# Patient Record
Sex: Male | Born: 1970
Health system: Southern US, Community
[De-identification: ages and names within clinical notes are randomized; demographics above are authoritative.]

## PROBLEM LIST (undated history)

## (undated) DIAGNOSIS — G8929 Other chronic pain: Secondary | ICD-10-CM

## (undated) DIAGNOSIS — L718 Other rosacea: Secondary | ICD-10-CM

## (undated) DIAGNOSIS — M549 Dorsalgia, unspecified: Secondary | ICD-10-CM

## (undated) DIAGNOSIS — K759 Inflammatory liver disease, unspecified: Secondary | ICD-10-CM

## (undated) DIAGNOSIS — K219 Gastro-esophageal reflux disease without esophagitis: Secondary | ICD-10-CM

## (undated) DIAGNOSIS — I10 Essential (primary) hypertension: Secondary | ICD-10-CM

## (undated) DIAGNOSIS — G4700 Insomnia, unspecified: Secondary | ICD-10-CM

## (undated) HISTORY — DX: Dorsalgia, unspecified: M54.9

## (undated) HISTORY — DX: Insomnia, unspecified: G47.00

## (undated) HISTORY — DX: Gastro-esophageal reflux disease without esophagitis: K21.9

## (undated) HISTORY — PX: LASIK: SHX215

## (undated) HISTORY — DX: Morbid (severe) obesity due to excess calories: E66.01

## (undated) HISTORY — DX: Other rosacea: L71.8

## (undated) HISTORY — DX: Essential (primary) hypertension: I10

## (undated) HISTORY — PX: KNEE SURGERY: SHX244

## (undated) HISTORY — DX: Other chronic pain: G89.29

---

## 2005-08-23 ENCOUNTER — Encounter: Admission: RE | Admit: 2005-08-23 | Discharge: 2005-09-24 | Payer: Self-pay | Admitting: Family Medicine

## 2006-06-20 ENCOUNTER — Ambulatory Visit: Payer: Self-pay | Admitting: Family Medicine

## 2006-06-29 ENCOUNTER — Ambulatory Visit: Payer: Self-pay | Admitting: Family Medicine

## 2006-07-10 ENCOUNTER — Ambulatory Visit: Payer: Self-pay | Admitting: Family Medicine

## 2006-07-10 LAB — CONVERTED CEMR LAB: Glucose, Bld: 86 mg/dL (ref 70–99)

## 2006-09-07 ENCOUNTER — Ambulatory Visit: Payer: Self-pay | Admitting: Family Medicine

## 2006-09-20 ENCOUNTER — Ambulatory Visit: Payer: Self-pay | Admitting: Family Medicine

## 2006-10-19 ENCOUNTER — Ambulatory Visit: Payer: Self-pay | Admitting: Family Medicine

## 2006-10-30 ENCOUNTER — Ambulatory Visit: Payer: Self-pay | Admitting: Family Medicine

## 2006-10-30 LAB — CONVERTED CEMR LAB
CO2: 27 meq/L (ref 19–32)
Chloride: 106 meq/L (ref 96–112)
Creatinine, Ser: 1 mg/dL (ref 0.4–1.2)
Glucose, Bld: 109 mg/dL — ABNORMAL HIGH (ref 70–99)
Potassium: 4.1 meq/L (ref 3.5–5.1)
Sodium: 138 meq/L (ref 135–145)

## 2007-01-17 DIAGNOSIS — M549 Dorsalgia, unspecified: Secondary | ICD-10-CM | POA: Insufficient documentation

## 2007-01-17 DIAGNOSIS — I10 Essential (primary) hypertension: Secondary | ICD-10-CM

## 2007-01-17 HISTORY — DX: Dorsalgia, unspecified: M54.9

## 2007-01-17 HISTORY — DX: Essential (primary) hypertension: I10

## 2007-04-05 ENCOUNTER — Ambulatory Visit: Payer: Self-pay | Admitting: Family Medicine

## 2007-04-11 ENCOUNTER — Ambulatory Visit: Payer: Self-pay | Admitting: Family Medicine

## 2007-04-11 LAB — CONVERTED CEMR LAB
Basophils Absolute: 0 10*3/uL (ref 0.0–0.1)
Calcium: 9.5 mg/dL (ref 8.4–10.5)
Chloride: 105 meq/L (ref 96–112)
Eosinophils Absolute: 0.2 10*3/uL (ref 0.0–0.6)
Eosinophils Relative: 2.6 % (ref 0.0–5.0)
GFR calc Af Amer: 81 mL/min
GFR calc non Af Amer: 67 mL/min
Glucose, Bld: 83 mg/dL (ref 70–99)
Lymphocytes Relative: 23.1 % (ref 12.0–46.0)
MCHC: 34.9 g/dL (ref 30.0–36.0)
MCV: 95.3 fL (ref 78.0–100.0)
Monocytes Relative: 8 % (ref 3.0–11.0)
Neutro Abs: 4.6 10*3/uL (ref 1.4–7.7)
Platelets: 283 10*3/uL (ref 150–400)
RBC: 4.45 M/uL (ref 3.87–5.11)
Vitamin B-12: 532 pg/mL (ref 211–911)

## 2007-04-12 ENCOUNTER — Telehealth (INDEPENDENT_AMBULATORY_CARE_PROVIDER_SITE_OTHER): Payer: Self-pay | Admitting: *Deleted

## 2007-05-31 ENCOUNTER — Ambulatory Visit: Payer: Self-pay | Admitting: Family Medicine

## 2007-06-03 ENCOUNTER — Ambulatory Visit: Payer: Self-pay | Admitting: Family Medicine

## 2007-06-04 ENCOUNTER — Telehealth (INDEPENDENT_AMBULATORY_CARE_PROVIDER_SITE_OTHER): Payer: Self-pay | Admitting: *Deleted

## 2007-06-25 ENCOUNTER — Encounter (INDEPENDENT_AMBULATORY_CARE_PROVIDER_SITE_OTHER): Payer: Self-pay | Admitting: Family Medicine

## 2007-08-20 ENCOUNTER — Ambulatory Visit: Payer: Self-pay | Admitting: Family Medicine

## 2007-08-20 LAB — CONVERTED CEMR LAB
Calcium: 9.4 mg/dL (ref 8.4–10.5)
Chloride: 107 meq/L (ref 96–112)
Cholesterol: 148 mg/dL (ref 0–200)
Creatinine, Ser: 1 mg/dL (ref 0.4–1.5)
GFR calc non Af Amer: 89 mL/min
Glucose, Bld: 88 mg/dL (ref 70–99)
LDL Cholesterol: 98 mg/dL (ref 0–99)
TSH: 1.28 microintl units/mL (ref 0.35–5.50)
VLDL: 12 mg/dL (ref 0–40)

## 2007-08-21 ENCOUNTER — Encounter (INDEPENDENT_AMBULATORY_CARE_PROVIDER_SITE_OTHER): Payer: Self-pay | Admitting: *Deleted

## 2007-09-27 ENCOUNTER — Telehealth (INDEPENDENT_AMBULATORY_CARE_PROVIDER_SITE_OTHER): Payer: Self-pay | Admitting: *Deleted

## 2007-10-02 ENCOUNTER — Telehealth (INDEPENDENT_AMBULATORY_CARE_PROVIDER_SITE_OTHER): Payer: Self-pay | Admitting: *Deleted

## 2007-11-28 ENCOUNTER — Ambulatory Visit: Payer: Self-pay | Admitting: Family Medicine

## 2007-12-03 ENCOUNTER — Encounter (INDEPENDENT_AMBULATORY_CARE_PROVIDER_SITE_OTHER): Payer: Self-pay | Admitting: *Deleted

## 2007-12-03 LAB — CONVERTED CEMR LAB
Calcium: 9.6 mg/dL (ref 8.4–10.5)
Chloride: 102 meq/L (ref 96–112)
Creatinine, Ser: 1 mg/dL (ref 0.4–1.5)
GFR calc non Af Amer: 89 mL/min

## 2007-12-24 ENCOUNTER — Ambulatory Visit: Payer: Self-pay | Admitting: Family Medicine

## 2007-12-24 ENCOUNTER — Telehealth (INDEPENDENT_AMBULATORY_CARE_PROVIDER_SITE_OTHER): Payer: Self-pay | Admitting: *Deleted

## 2007-12-24 LAB — CONVERTED CEMR LAB: Rapid Strep: NEGATIVE

## 2007-12-31 ENCOUNTER — Encounter (INDEPENDENT_AMBULATORY_CARE_PROVIDER_SITE_OTHER): Payer: Self-pay | Admitting: *Deleted

## 2008-03-31 ENCOUNTER — Ambulatory Visit: Payer: Self-pay | Admitting: Internal Medicine

## 2008-03-31 DIAGNOSIS — E669 Obesity, unspecified: Secondary | ICD-10-CM

## 2008-03-31 DIAGNOSIS — K644 Residual hemorrhoidal skin tags: Secondary | ICD-10-CM

## 2008-03-31 HISTORY — DX: Residual hemorrhoidal skin tags: K64.4

## 2008-03-31 HISTORY — DX: Obesity, unspecified: E66.9

## 2008-08-03 ENCOUNTER — Ambulatory Visit: Payer: Self-pay | Admitting: Internal Medicine

## 2008-08-05 ENCOUNTER — Encounter (INDEPENDENT_AMBULATORY_CARE_PROVIDER_SITE_OTHER): Payer: Self-pay | Admitting: *Deleted

## 2008-08-05 LAB — CONVERTED CEMR LAB
ALT: 34 units/L (ref 0–53)
BUN: 13 mg/dL (ref 6–23)
Basophils Absolute: 0 10*3/uL (ref 0.0–0.1)
Eosinophils Absolute: 0.2 10*3/uL (ref 0.0–0.7)
GFR calc Af Amer: 121 mL/min
GFR calc non Af Amer: 100 mL/min
HCT: 42.6 % (ref 39.0–52.0)
HDL: 46.2 mg/dL (ref >39.0)
MCHC: 33.9 g/dL (ref 30.0–36.0)
MCV: 96.5 fL (ref 78.0–100.0)
Monocytes Absolute: 0.4 10*3/uL (ref 0.1–1.0)
Platelets: 262 10*3/uL (ref 150–400)
Potassium: 4.6 meq/L (ref 3.5–5.1)
RDW: 11.9 % (ref 11.5–14.6)
Sodium: 141 meq/L (ref 135–145)
TSH: 1.49 microintl units/mL (ref 0.35–5.50)
Triglycerides: 60 mg/dL (ref 0–149)

## 2008-09-01 ENCOUNTER — Ambulatory Visit: Payer: Self-pay | Admitting: Family Medicine

## 2008-09-01 ENCOUNTER — Telehealth (INDEPENDENT_AMBULATORY_CARE_PROVIDER_SITE_OTHER): Payer: Self-pay | Admitting: *Deleted

## 2008-09-01 DIAGNOSIS — J0110 Acute frontal sinusitis, unspecified: Secondary | ICD-10-CM | POA: Insufficient documentation

## 2008-09-01 HISTORY — DX: Acute frontal sinusitis, unspecified: J01.10

## 2008-09-04 ENCOUNTER — Telehealth (INDEPENDENT_AMBULATORY_CARE_PROVIDER_SITE_OTHER): Payer: Self-pay | Admitting: *Deleted

## 2008-09-07 ENCOUNTER — Encounter: Payer: Self-pay | Admitting: Internal Medicine

## 2008-10-06 ENCOUNTER — Ambulatory Visit: Payer: Self-pay | Admitting: Family Medicine

## 2008-10-20 ENCOUNTER — Ambulatory Visit: Payer: Self-pay | Admitting: Family Medicine

## 2008-11-03 ENCOUNTER — Ambulatory Visit: Payer: Self-pay | Admitting: Family Medicine

## 2008-12-01 ENCOUNTER — Ambulatory Visit: Payer: Self-pay | Admitting: Family Medicine

## 2008-12-28 ENCOUNTER — Ambulatory Visit: Payer: Self-pay | Admitting: Family Medicine

## 2008-12-28 DIAGNOSIS — M7660 Achilles tendinitis, unspecified leg: Secondary | ICD-10-CM

## 2008-12-28 HISTORY — DX: Achilles tendinitis, unspecified leg: M76.60

## 2009-01-05 ENCOUNTER — Ambulatory Visit: Payer: Self-pay | Admitting: Family Medicine

## 2009-01-07 LAB — CONVERTED CEMR LAB
Albumin: 4.2 g/dL (ref 3.5–5.2)
Bilirubin, Direct: 0 mg/dL (ref 0.0–0.3)
CO2: 32 meq/L (ref 19–32)
Calcium: 9.4 mg/dL (ref 8.4–10.5)
Creatinine, Ser: 0.9 mg/dL (ref 0.4–1.5)
Total Protein: 6.6 g/dL (ref 6.0–8.3)

## 2009-01-08 ENCOUNTER — Encounter (INDEPENDENT_AMBULATORY_CARE_PROVIDER_SITE_OTHER): Payer: Self-pay | Admitting: *Deleted

## 2009-01-19 ENCOUNTER — Ambulatory Visit: Payer: Self-pay | Admitting: Family Medicine

## 2009-04-06 ENCOUNTER — Ambulatory Visit: Payer: Self-pay | Admitting: Family Medicine

## 2009-04-06 DIAGNOSIS — J069 Acute upper respiratory infection, unspecified: Secondary | ICD-10-CM | POA: Insufficient documentation

## 2009-06-29 ENCOUNTER — Ambulatory Visit: Payer: Self-pay | Admitting: Family Medicine

## 2009-06-29 DIAGNOSIS — R079 Chest pain, unspecified: Secondary | ICD-10-CM

## 2009-06-29 DIAGNOSIS — K219 Gastro-esophageal reflux disease without esophagitis: Secondary | ICD-10-CM | POA: Insufficient documentation

## 2009-06-29 DIAGNOSIS — K5289 Other specified noninfective gastroenteritis and colitis: Secondary | ICD-10-CM

## 2009-06-29 HISTORY — DX: Chest pain, unspecified: R07.9

## 2009-06-29 HISTORY — DX: Gastro-esophageal reflux disease without esophagitis: K21.9

## 2009-06-29 HISTORY — DX: Other specified noninfective gastroenteritis and colitis: K52.89

## 2009-08-03 ENCOUNTER — Ambulatory Visit: Payer: Self-pay | Admitting: Family

## 2009-08-03 LAB — CONVERTED CEMR LAB
AST: 25 units/L (ref 0–37)
BUN: 11 mg/dL (ref 6–23)
Basophils Relative: 0.1 % (ref 0.0–3.0)
Bilirubin Urine: NEGATIVE
Chloride: 102 meq/L (ref 96–112)
Cholesterol: 187 mg/dL (ref 0–200)
Eosinophils Relative: 2.8 % (ref 0.0–5.0)
GFR calc non Af Amer: 99.81 mL/min (ref >60)
Glucose, Urine, Semiquant: NEGATIVE
HCT: 45 % (ref 39.0–52.0)
Hemoglobin: 15.4 g/dL (ref 13.0–17.0)
LDL Cholesterol: 106 mg/dL — ABNORMAL HIGH (ref 0–99)
Lymphs Abs: 1.4 10*3/uL (ref 0.7–4.0)
MCV: 98.9 fL (ref 78.0–100.0)
Monocytes Relative: 6.7 % (ref 3.0–12.0)
Platelets: 306 10*3/uL (ref 150.0–400.0)
Potassium: 3.9 meq/L (ref 3.5–5.1)
Protein, U semiquant: NEGATIVE
RBC: 4.55 M/uL (ref 4.22–5.81)
Sodium: 142 meq/L (ref 135–145)
Specific Gravity, Urine: <1.005
Total CHOL/HDL Ratio: 3
VLDL: 19.4 mg/dL (ref 0.0–40.0)
WBC: 7.3 10*3/uL (ref 4.5–10.5)
pH: 7.5

## 2009-08-04 ENCOUNTER — Encounter: Payer: Self-pay | Admitting: Family

## 2009-09-06 ENCOUNTER — Telehealth: Payer: Self-pay | Admitting: Family

## 2009-09-06 ENCOUNTER — Ambulatory Visit: Payer: Self-pay | Admitting: Family

## 2009-11-12 ENCOUNTER — Telehealth: Payer: Self-pay | Admitting: Family

## 2009-12-07 ENCOUNTER — Ambulatory Visit: Payer: Self-pay | Admitting: Family

## 2009-12-07 LAB — CONVERTED CEMR LAB
CO2: 30 meq/L (ref 19–32)
Calcium: 9.5 mg/dL (ref 8.4–10.5)
Creatinine, Ser: 1 mg/dL (ref 0.4–1.5)
GFR calc non Af Amer: 88.22 mL/min (ref >60)
Glucose, Bld: 84 mg/dL (ref 70–99)

## 2010-03-09 ENCOUNTER — Ambulatory Visit: Payer: Self-pay | Admitting: Internal Medicine

## 2010-09-22 ENCOUNTER — Ambulatory Visit: Payer: Self-pay | Admitting: Family Medicine

## 2010-09-22 DIAGNOSIS — R071 Chest pain on breathing: Secondary | ICD-10-CM | POA: Insufficient documentation

## 2010-09-22 HISTORY — DX: Chest pain on breathing: R07.1

## 2010-09-23 LAB — CONVERTED CEMR LAB
ALT: 48 units/L (ref 0–53)
BUN: 15 mg/dL (ref 6–23)
Bilirubin, Direct: 0.1 mg/dL (ref 0.0–0.3)
Cholesterol: 190 mg/dL (ref 0–200)
Creatinine, Ser: 0.9 mg/dL (ref 0.4–1.5)
Eosinophils Relative: 3.3 % (ref 0.0–5.0)
GFR calc non Af Amer: 99.23 mL/min (ref >60.00)
HDL: 65.8 mg/dL (ref >39.00)
Lymphocytes Relative: 24.4 % (ref 12.0–46.0)
Monocytes Relative: 8.6 % (ref 3.0–12.0)
Neutrophils Relative %: 63.2 % (ref 43.0–77.0)
Platelets: 254 10*3/uL (ref 150.0–400.0)
Potassium: 4.2 meq/L (ref 3.5–5.1)
TSH: 2.63 microintl units/mL (ref 0.35–5.50)
Total Protein: 7.5 g/dL (ref 6.0–8.3)
Triglycerides: 32 mg/dL (ref 0.0–149.0)
VLDL: 6.4 mg/dL (ref 0.0–40.0)
WBC: 7.4 10*3/uL (ref 4.5–10.5)

## 2010-10-25 NOTE — Assessment & Plan Note (Signed)
Summary: 3 month ov/nta   Vital Signs:  Patient profile:   40 year old male Weight:      300.50 pounds O2 Sat:      98 % on Room air Temp:     98.0 degrees F oral Pulse rate:   74 / minute Pulse rhythm:   regular Resp:     12 per minute BP sitting:   130 / 78  (right arm) Cuff size:   large  Vitals Entered By: Mervin Kung CMA (December 07, 2009 8:05 AM)  O2 Flow:  Room air CC: room 17  3 month follow up Comments Pt has old inury to left lower leg.  Still has intermittent swelling and pain.   Primary Care Provider:  Drue Novel  CC:  room 17  3 month follow up.  History of Present Illness: Gilbert [name] is a 40 year old male who presents today for follow up of his hypertension.  Notes that he has been taking his meds as directed.    GERD- reports symptoms are well controlled on PPI  Also notes + tenderness on left shin.  Notes that his child accidentally kicked his shin about 6 weeks ago while playing soccer.  Swelling is improved.    Obesity- is trying to start diet again, is contemplating seeing a bariatric surgeon.   Allergies: 1)  ! Penicillin G Pot in Dextrose (Penicillin G Potassium in D5w)  Physical Exam  General:  Well-developed,well-nourished,in no acute distress; alert,appropriate and cooperative throughout examination Lungs:  Normal respiratory effort, chest expands symmetrically. Lungs are clear to auscultation, no crackles or wheezes. Heart:  Normal rate and regular rhythm. S1 and S2 normal without gallop, murmur, click, rub or other extra sounds. Msk:  small area of induration on left shin, mild echymosis (supect resolving hematoma)   Impression & Recommendations:  Problem # 1:  HYPERTENSION (ICD-401.9) Assessment Unchanged BP stable, continue same, check BMET. His updated medication list for this problem includes:    Enalapril Maleate 20 Mg Tabs (Enalapril maleate) .Marland Kitchen... Take 2 tablet by mouth once a day    Hydrochlorothiazide 25 Mg Tabs (Hydrochlorothiazide)  ..... One tablet by mouth daily  Orders: Venipuncture (11914) TLB-BMP (Basic Metabolic Panel-BMET) (80048-METABOL)  BP today: 130/78 Prior BP: 124/80 (09/06/2009)  Labs Reviewed: K+: 3.9 (08/03/2009) Creat: : 0.9 (08/03/2009)   Chol: 187 (08/03/2009)   HDL: 61.90 (08/03/2009)   LDL: 106 (08/03/2009)   TG: 97.0 (08/03/2009)  Problem # 2:  GERD (ICD-530.81) Assessment: Improved Stable on PPI His updated medication list for this problem includes:    Prilosec Otc 20 Mg Tbec (Omeprazole magnesium) ..... One tablet by mouth daily.  you may increase to two tablets once daily if your symptoms are sever  Complete Medication List: 1)  Enalapril Maleate 20 Mg Tabs (Enalapril maleate) .... Take 2 tablet by mouth once a day 2)  Fluticasone Propionate 50 Mcg/act Susp (Fluticasone propionate) .... Two sprays each nostril once daily 3)  Hydrochlorothiazide 25 Mg Tabs (Hydrochlorothiazide) .... One tablet by mouth daily 4)  Prilosec Otc 20 Mg Tbec (Omeprazole magnesium) .... One tablet by mouth daily.  you may increase to two tablets once daily if your symptoms are sever  Patient Instructions: 1)  Please follow up in 3 months. 2)  Have a nice spring! Prescriptions: HYDROCHLOROTHIAZIDE 25 MG TABS (HYDROCHLOROTHIAZIDE) one tablet by mouth daily  #30 x 2   Entered and Authorized by:   Lemont Fillers FNP   Signed by:   Efraim Kaufmann  Arvil Chaco FNP on 12/07/2009   Method used:   [facility]        [facility] [phone][facility] (retail)       [address]       [place], [place]  [address]       Ph: [phone]       Fax: [phone]   RxID:   [id number] ENALAPRIL MALEATE 20 MG TABS (ENALAPRIL MALEATE) Take 2 tablet by mouth once a day  #60 Tablet x 2   Entered and Authorized by:   Lemont Fillers FNP   Signed by:   Lemont Fillers FNP on 12/07/2009   Method used:   [facility]ronically to        [facility] [phone]* (retail)       [address]       [place], [place]  [address]       Ph: [phone]       Fax: [phone]   RxID:   [id number]   Current Allergies (reviewed today): ! PENICILLIN G POT IN DEXTROSE (PENICILLIN G POTASSIUM IN D5W)

## 2010-10-25 NOTE — Assessment & Plan Note (Signed)
Summary: cough/cbs   Vital Signs:  Patient profile:   40 year old male Weight:      300.8 pounds Temp:     98.6 degrees F oral Pulse rate:   76 / minute Resp:     14 per minute BP sitting:   114 / 70  (left arm) Cuff size:   large  Vitals Entered By: Shonna Chock (March 09, 2010 3:22 PM) CC: Cough and fever at night since Monday Comments REVIEWED MED LIST, PATIENT AGREED DOSE AND INSTRUCTION CORRECT    Primary Care Provider:  Drue Novel  CC:  Cough and fever at night since Monday.  History of Present Illness: Onset 03/07/2010 as cough,arthralgias, malaise & frontal headache, progressive through day. Temp to 101 F. PNDrainage with N&V. Rx: NSAIDS, Nyquil. As of 06/14 better except persistent malaise ; recurrent temp last night with sweats.  Allergies: 1)  ! Penicillin G Pot in Dextrose (Penicillin G Potassium in D5w)  Review of Systems General:  No F,C, or sweats today. Eyes:  Denies discharge, eye pain, and red eye. ENT:  Denies nasal congestion, sinus pressure, and sore throat; Minor frontal headache w/o facial pain or purulence. Resp:  Complains of shortness of breath, sputum productive, and wheezing; Mainly NP cough ; scant dark yellow -green. No PMH of asthma; non smoker. GI:  Complains of diarrhea; Diarrhea X 1 yesterday. GU:  Denies discharge, dysuria, and hematuria. Derm:  Denies lesion(s) and rash; no known tick exposure but doing  yardwork 06/11-12.  Physical Exam  General:  in no acute distress; alert,appropriate and cooperative throughout examination Ears:  External ear exam shows no significant lesions or deformities.  Otoscopic examination reveals clear canals, tympanic membranes are intact bilaterally without bulging, retraction, inflammation or discharge. Hearing is grossly normal bilaterally. Nose:  External nasal examination shows no deformity or inflammation.R nasal mucosa  slightly erythematous without lesions or exudates. Mouth:  Oral mucosa and oropharynx  without lesions or exudates.  Teeth in good repair. Lungs:  Normal respiratory effort, chest expands symmetrically. Lungs : initial rales RLL which cleared Heart:  Normal rate and regular rhythm. S1 and S2 normal without gallop, murmur, click, rub or other extra sounds. Abdomen:  Bowel sounds positive,abdomen soft and non-tender without masses, organomegaly or hernias noted. Cervical Nodes:  No lymphadenopathy noted Axillary Nodes:  No palpable lymphadenopathy   Impression & Recommendations:  Problem # 1:  COUGH (ICD-786.2) mainly NP ; possible RAD component. R/O atypical bacterial infection   Problem # 2:  FEVER (ICD-780.60)  Complete Medication List: 1)  Enalapril Maleate 20 Mg Tabs (Enalapril maleate) .... Take 2 tablet by mouth once a day 2)  Hydrochlorothiazide 25 Mg Tabs (Hydrochlorothiazide) .... One tablet by mouth daily 3)  Prilosec Otc 20 Mg Tbec (Omeprazole magnesium) .... One tablet by mouth daily.  you may increase to two tablets once daily if your symptoms are sever 4)  Azithromycin 250 Mg Tabs (Azithromycin) .... As per pack 5)  Promethazine Vc/codeine 6.25-5-10 Mg/45ml Syrp (Phenyleph-promethazine-cod) .Marland Kitchen.. 1 tsp every 6 hrs as needed for cough  Patient Instructions: 1)  Drink as much fluid as you can tolerate for the next few days. Neti pot once daily as needed for sinus congestion. Advair 1 inhalation every 12 hrs ; gargle & spit after use.Take 650-1000mg  of Tylenol every 4-6 hours as needed for relief of pain or comfort of fever AVOID taking more than 4000mg   in a 24 hour period (can cause liver damage in higher doses) OR take  400-600mg  of Ibuprofen (Advil, Motrin) with food every 4-6 hours as needed for relief of pain or comfort of fever.Check for ticks ! Prescriptions: PROMETHAZINE VC/CODEINE 6.25-5-10 MG/5ML SYRP (PHENYLEPH-PROMETHAZINE-COD) 1 tsp every 6 hrs as needed for cough  #120cc x 0   Entered and Authorized by:   Marga Melnick MD   Signed by:   Marga Melnick  MD on 03/09/2010   Method used:   Printed then faxed to ...       [facility] [phone]* (retail)       [address]       [place], [place]  [address]       Ph: [phone]       Fax: [phone]   RxID:   [id number] AZITHROMYCIN 250 MG TABS (AZITHROMYCIN) as per pack  #1 x 0   Entered and Authorized by:   Marga Melnick MD   Signed by:   Marga Melnick MD on 03/09/2010   Method used:   Printed then faxed to ...       [facility] [phone]* (retail)       [address]       [place], [place]  [address]       Ph: [phone]       Fax: [phone]   RxID:   [id number]

## 2010-10-25 NOTE — Progress Notes (Signed)
Summary: rx denial nasacort  Phone Note From Pharmacy   [facility] [phone]* Call For: Nasacort AQ nasal spray  Reason for Call: Needs renewal Summary of Call: Pharmacy request for Nasacort AQ NS was denied. Denial faxed to [phone].

## 2010-10-27 NOTE — Assessment & Plan Note (Signed)
Summary: cpx/fast/cbs   Vital Signs:  Patient profile:   40 year old male Height:      75.25 inches Weight:      295 pounds BMI:     36.76 Pulse rate:   85 / minute BP sitting:   120 / 80  (left arm)  Vitals Entered By: Doristine Devoid CMA (September 22, 2010 8:06 AM) CC: CPX AND LABS   History of Present Illness: 40 yo man here today for CPE.    R rib pain- sxs started 1 month ago.  no known injury.  worse w/ lying on that side, occasionally painful w/ turning.  did some heavy lifting at time that sxs started.  sxs improved after a few weeks but then returned 2-3 days ago.  no pain last night.  Preventive Screening-Counseling & Management  Alcohol-Tobacco     Alcohol drinks/day: 1     Smoking Status: never     Cigars/week: 5/yr  Caffeine-Diet-Exercise     Diet Comments: working on diet     Does Patient Exercise: no      Sexual History:  currently monogamous.        Drug Use:  never.    Current Medications (verified): 1)  Enalapril Maleate 20 Mg Tabs (Enalapril Maleate) .... Take 2 Tablet By Mouth Once A Day 2)  Hydrochlorothiazide 25 Mg Tabs (Hydrochlorothiazide) .... One Tablet By Mouth Daily 3)  Prilosec Otc 20 Mg Tbec (Omeprazole Magnesium) .... One Tablet By Mouth Daily.  You May Increase To Two Tablets Once Daily If Your Symptoms Are Sever  Allergies (verified): 1)  ! Penicillin G Pot in Dextrose (Penicillin G Potassium in D5w)  Past History:  Past medical, surgical, family and social histories (including risk factors) reviewed, and no changes noted (except as noted below).  Past Medical History: Hypertension BACK PAIN, CHRONIC (mild, on-off, sxstarted after wt gain) ocular rosacea  Past Surgical History: Reviewed history from 03/31/2008 and no changes required. Knee surgery-Right lasik eye surgery  Family History: Reviewed history from 03/31/2008 and no changes required. HTN - F CAD - no Stroke - no DM - F (diet controlled) colon Ca - no prostate  Ca - no  Social History: Reviewed history from 08/03/2008 and no changes required. Occupation: Administrator, sports Married 2 children Alcohol use-yes: socially Regular exercise-no Drug Use:  never Sexual History:  currently monogamous  Review of Systems  The patient denies anorexia, fever, weight loss, weight gain, vision loss, decreased hearing, hoarseness, chest pain, syncope, dyspnea on exertion, peripheral edema, prolonged cough, headaches, abdominal pain, melena, hematochezia, severe indigestion/heartburn, hematuria, suspicious skin lesions, depression, abnormal bleeding, enlarged lymph nodes, and testicular masses.    Physical Exam  General:  in no acute distress; alert,appropriate and cooperative throughout examination Head:  Normocephalic and atraumatic without obvious abnormalities. No apparent alopecia or balding. Eyes:  No corneal or conjunctival inflammation noted. EOMI. Perrla. Funduscopic exam benign, without hemorrhages, exudates or papilledema. Vision grossly normal. Ears:  External ear exam shows no significant lesions or deformities.  Otoscopic examination reveals clear canals, tympanic membranes are intact bilaterally without bulging, retraction, inflammation or discharge. Hearing is grossly normal bilaterally. Nose:  External nasal examination shows no deformity or inflammation. Nasal mucosa are pink and moist without lesions or exudates. Mouth:  Oral mucosa and oropharynx without lesions or exudates.  Teeth in good repair. Neck:  No deformities, masses, or tenderness noted. Lungs:  Normal respiratory effort, chest expands symmetrically. Lungs are clear to auscultation, no crackles or  wheezes. Heart:  Normal rate and regular rhythm. S1 and S2 normal without gallop, murmur, click, rub or other extra sounds. Abdomen:  Bowel sounds positive,abdomen soft and non-tender without masses, organomegaly or hernias noted. Genitalia:  Testes bilaterally descended without  nodularity, tenderness or masses. No scrotal masses or lesions. No penis lesions or urethral discharge. Pulses:  +2 carotid, radial, DP/PT Extremities:  no C/C/E Neurologic:  No cranial nerve deficits noted. Station and gait are normal. Plantar reflexes are down-going bilaterally. DTRs are symmetrical throughout. Sensory, motor and coordinative functions appear intact. Skin:  Intact without suspicious lesions or rashes Cervical Nodes:  No lymphadenopathy noted Axillary Nodes:  No palpable lymphadenopathy Psych:  Cognition and judgment appear intact. Alert and cooperative with normal attention span and concentration. No apparent delusions, illusions, hallucinations   Impression & Recommendations:  Problem # 1:  PREVENTIVE HEALTH CARE (ICD-V70.0) Assessment Unchanged pt's PE WNL.  check labs.  encouraged healthy diet and regulare exercise.  Problem # 2:  CHEST WALL PAIN, ANTERIOR (WGN-562.13) Assessment: New most likely muscular given hx of heavy lifiting prior to pain and improvement w/ time.  reviewed NSAIDs, heat/ice for pain relief.  reviewed supportive care and red flags that should prompt return.  Pt expresses understanding and is in agreement w/ this plan.  Complete Medication List: 1)  Enalapril Maleate 20 Mg Tabs (Enalapril maleate) .... Take 2 tablet by mouth once a day 2)  Hydrochlorothiazide 25 Mg Tabs (Hydrochlorothiazide) .... One tablet by mouth daily 3)  Prilosec Otc 20 Mg Tbec (Omeprazole magnesium) .... One tablet by mouth daily.  you may increase to two tablets once daily if your symptoms are sever  Other Orders: Venipuncture (08657) TLB-BMP (Basic Metabolic Panel-BMET) (80048-METABOL) TLB-CBC Platelet - w/Differential (85025-CBCD) TLB-TSH (Thyroid Stimulating Hormone) (84443-TSH) TLB-Lipid Panel (80061-LIPID) TLB-Hepatic/Liver Function Pnl (80076-HEPATIC)  Patient Instructions: 1)  Follow up in 6 months to recheck your blood pressure 2)  Your exam looks good!   Keep up the good work! 3)  We'll notify you of your blood work 4)  Call and let me know your Clindamycin dose and we can refill this for you 5)  Your rib pain is most likely muscular and should improve w/ ibuprofen as needed 6)  Call with any questions or concerns 7)  Happy New Year!!   Orders Added: 1)  Venipuncture [36415] 2)  TLB-BMP (Basic Metabolic Panel-BMET) [80048-METABOL] 3)  TLB-CBC Platelet - w/Differential [85025-CBCD] 4)  TLB-TSH (Thyroid Stimulating Hormone) [84443-TSH] 5)  TLB-Lipid Panel [80061-LIPID] 6)  TLB-Hepatic/Liver Function Pnl [80076-HEPATIC] 7)  Est. Patient 40-64 years [99396] 8)  Est. Patient Level II [84696]

## 2010-12-18 ENCOUNTER — Other Ambulatory Visit: Payer: Self-pay | Admitting: Family Medicine

## 2010-12-19 NOTE — Telephone Encounter (Signed)
Pt is not due for ov yet, refill sent.

## 2011-02-08 ENCOUNTER — Encounter: Payer: Self-pay | Admitting: Family Medicine

## 2011-03-23 ENCOUNTER — Ambulatory Visit: Payer: Self-pay | Admitting: Family Medicine

## 2011-03-23 DIAGNOSIS — Z0289 Encounter for other administrative examinations: Secondary | ICD-10-CM

## 2011-04-11 ENCOUNTER — Ambulatory Visit (INDEPENDENT_AMBULATORY_CARE_PROVIDER_SITE_OTHER): Payer: BC Managed Care – PPO | Admitting: Family Medicine

## 2011-04-11 ENCOUNTER — Encounter: Payer: Self-pay | Admitting: Family Medicine

## 2011-04-11 VITALS — BP 130/84 | HR 78 | Temp 98.7°F | Wt 306.6 lb

## 2011-04-11 DIAGNOSIS — R079 Chest pain, unspecified: Secondary | ICD-10-CM

## 2011-04-11 DIAGNOSIS — W57XXXA Bitten or stung by nonvenomous insect and other nonvenomous arthropods, initial encounter: Secondary | ICD-10-CM

## 2011-04-11 DIAGNOSIS — Z8249 Family history of ischemic heart disease and other diseases of the circulatory system: Secondary | ICD-10-CM

## 2011-04-11 DIAGNOSIS — IMO0001 Reserved for inherently not codable concepts without codable children: Secondary | ICD-10-CM

## 2011-04-11 DIAGNOSIS — T148XXA Other injury of unspecified body region, initial encounter: Secondary | ICD-10-CM

## 2011-04-11 MED ORDER — DOXYCYCLINE HYCLATE 100 MG PO TABS: 100.0000 mg | ORAL_TABLET | Freq: Two times a day (BID) | ORAL | Status: DC

## 2011-04-11 NOTE — Patient Instructions (Addendum)
Deer Tick Bites Deer ticks are brown arachnids (spider family) that vary in size from as small as the head of a pin to 1/4 inch (1/2 cm) diameter. They thrive in wooded areas. Deer are the preferred host of adult deer ticks. Small rodents are the host of young ticks (nymphs). When a person walks in a field or wooded area, young and adult ticks in the surrounding grass and vegetation can attach themselves to the skin. They can suck blood for hours to days if unnoticed. Ticks are found all over the [place] Some ticks carry a specific bacteria (Borrelia burgdorferi) that causes an infection called Lyme disease. The bacteria is typically passed into a person during the blood sucking process. This happens after the tick has been attached for at least a number of hours. While ticks can be found all over the [place] those carrying the bacteria that causes Lyme disease are most common in [place] and the [place]. Only a small proportion of ticks in these areas carry the Lyme disease bacteria and cause human infections. Ticks usually attach to warm spots on the body, such as the:  Head.   Back.   Neck.   Armpits.   Groin.  SYMPTOMS Most of the time, a deer tick bite will not be felt. You may or may not see the attached tick. You may notice mild irritation or redness around the bite site. If the deer tick passes the Lyme disease bacteria to a person, a round, red rash may be noticed 2 to 3 days after the bite. The rash may be clear in the middle, like a bull's-eye or target. If not treated, other symptoms may develop several days to weeks after the onset of the rash. These symptoms may include:  New rash lesions.   Fatigue and weakness.   General ill feeling and achiness.   Chills.   Headache and neck pain.   Swollen lymph glands.   Sore muscles and joints.  5 to 15% of untreated people with Lyme disease may develop more severe illnesses after several weeks to months. This may include inflammation  of the brain lining (meningitis), nerve palsies, an abnormal heartbeat, or severe muscle and joint pain and inflammation (myositis or arthritis). DIAGNOSIS  Physical exam and medical history.   Viewing the tick if it was saved for confirmation.   Blood tests (to check or confirm the presence of Lyme disease).  TREATMENT Most ticks do not carry disease. If found, an attached tick should be removed using tweezers. Tweezers should be placed under the body of the tick so it is removed by its attachment parts (pincers). If there are signs or symptoms of being sick, or Lyme disease is confirmed, medicines (antibiotics) that kill germs are usually prescribed. In more severe cases, antibiotics may be given through an intravenous (IV) access. HOME CARE INSTRUCTIONS  Always remove ticks with tweezers. Do not use petroleum jelly or other methods to kill or remove the tick. Slide the tweezers under the body and pull out as much as you can. If you are not sure what it is, save it in a jar and show your caregiver.   Once you remove the tick, the skin will heal on its own. Wash your hands and the affected area with water and soap. You may place a bandage on the affected area.   Take medicine as directed. You may be advised to take a full course of antibiotics.   Follow up with your caregiver as  recommended.  FINDING OUT THE RESULTS OF YOUR TEST Not all test results are available during your visit. If your test results are not back during the visit, make an appointment with your caregiver to find out the results. Do not assume everything is normal if you have not heard from your caregiver or the medical facility. It is important for you to follow up on all of your test results. PROGNOSIS If Lyme disease is confirmed, early treatment with antibiotics is very effective. Following preventive guidelines is important since it is possible to get the disease more than once. PREVENTION  Wear long sleeves and long  pants in wooded or grassy areas. Tuck your pants into your socks.   Use an insect repellent while hiking.   Check yourself, your children, and your pets regularly for ticks after playing outside.   Clear piles of leaves or brush from your yard. Ticks might live there.  SEEK MEDICAL CARE IF:  You or your child has an oral temperature above 100.4.   You develop a severe headache following the bite.   You feel generally ill.   You notice a rash.   You are having trouble removing the tick.   The bite area has red skin or yellow drainage.  SEEK IMMEDIATE MEDICAL CARE IF:  Your face is weak and droopy or you have other neurological symptoms.   You have severe joint pain or weakness.  MAKE SURE YOU:  Understand these instructions.   Will watch your condition.   Will get help right away if you are not doing well or get worse.  FOR MORE INFORMATION: Centers for Disease Control and Prevention: FootballExhibition.com.br American Academy of [employer] Physicians: www.https://powers.com/ Document Released: 12/06/2009  [facility] Patient Information 2011 [place], [place].  Chest Pain (Nonspecific) It is often hard to give a specific diagnosis for the cause of chest pain. There is always a chance that your pain could be related to something serious, such as a heart attack or a blood clot in the lungs. You need to follow up with your caregiver for further evaluation. CAUSES  Heartburn.   Pneumonia or bronchitis.   Anxiety and stress.   Inflammation around your heart (pericarditis) or lung (pleuritis or pleurisy).   A blood clot in the lung.   A collapsed lung (pneumothorax). It can develop suddenly on its own (spontaneous pneumothorax) or from injury (trauma) to the chest.  The chest wall is composed of bones, muscles, and cartilage. Any of these can be the source of the pain.  The bones can be bruised by injury.   The muscles or cartilage can be strained by coughing or overwork.   The cartilage can be  affected by inflammation and become sore (costochondritis).  DIAGNOSIS Lab tests or other studies, such as X-rays, an EKG, stress testing, or cardiac imaging, may be needed to find the cause of your pain.  TREATMENT  Treatment depends on what may be causing your chest pain. Treatment may include:   Acid blockers for heartburn.  Anti-inflammatory medicine.   Pain medicine for inflammatory conditions.  Antibiotics if an infection is present.    You may be advised to change lifestyle habits. This includes stopping smoking and avoiding caffeine and chocolate.   You may be advised to keep your head raised (elevated) when sleeping. This reduces the chance of acid going backward from your stomach into your esophagus.   Most of the time, nonspecific chest pain will improve within 2 to 3 days with rest  and mild pain medicine.  HOME CARE INSTRUCTIONS  If antibiotics were prescribed, take the full amount even if you start to feel better.   For the next few days, avoid physical activities that bring on chest pain. Continue physical activities as directed.   Do not smoke cigarettes or drink alcohol until your symptoms are gone.   Only take over-the-counter or prescription medicine for pain, discomfort, or fever as directed by your caregiver.   Follow your caregiver's suggestions for further testing if your chest pain does not go away.   Keep any follow-up appointments you made. If you do not go to an appointment, you could develop lasting (chronic) problems with pain. If there is any problem keeping an appointment, you must call to reschedule.  SEEK MEDICAL CARE IF:  You think you are having problems from the medicine you are taking. Read your medicine instructions carefully.   Your chest pain does not go away, even after treatment.   You develop a rash with blisters on your chest.  SEEK IMMEDIATE MEDICAL CARE IF:  You have increased chest pain or pain that spreads to your arm, neck, jaw,  back, or belly (abdomen).   You develop shortness of breath, an increasing cough, or you are coughing up blood.   You have severe back or abdominal pain, feel sick to your stomach (nauseous) or throw up (vomit).   You develop severe weakness, fainting, or chills.   You have an oral temperature above 100.4, not controlled by medicine.  THIS IS AN EMERGENCY. Do not wait to see if the pain will go away. Get medical help at once. Call your local emergency services 911 (911 in [place] Do not drive yourself to the hospital. MAKE SURE YOU:  Understand these instructions.   Will watch your condition.   Will get help right away if you are not doing well or get worse.  Document Released: 06/21/2005 Document Re-Released: 12/06/2009 [facility] Patient Information 2011 [place], [place].

## 2011-04-11 NOTE — Progress Notes (Signed)
  Subjective:    Patient ID: Gilbert [name], male    DOB: [birth year], 40 y.o.   MRN: [id number]  HPI Pt here c/o tick bite last week and red spot now.  No fevers, body aches, rashes etc. Pt also at end of visit mentioned occassional Chest pain with exertion.  No radiation of pain and only lasts few seconds. He is concerned because his father has heart disease.  Pt exercises and has lost some weight.  He also gets a little sob.     Review of Systems as above   Objective:   Physical Exam  Constitutional: He is oriented to person, place, and time. He appears well-developed and well-nourished.  Cardiovascular: Normal rate, regular rhythm and normal heart sounds.   No murmur heard. Pulmonary/Chest: Effort normal and breath sounds normal. No respiratory distress. He has no wheezes. He has no rales. He exhibits no tenderness.  Neurological: He is alert and oriented to person, place, and time.  Skin:     Psychiatric: He has a normal mood and affect. His behavior is normal. Judgment and thought content normal.          Assessment & Plan:

## 2011-04-12 LAB — BASIC METABOLIC PANEL
Calcium: 9.1 mg/dL (ref 8.4–10.5)
GFR: 94.11 mL/min (ref >60.00)
Sodium: 141 mEq/L (ref 135–145)

## 2011-04-12 LAB — CBC WITH DIFFERENTIAL/PLATELET
Eosinophils Absolute: 0.1 10*3/uL (ref 0.0–0.7)
Eosinophils Relative: 1.8 % (ref 0.0–5.0)
HCT: 41.8 % (ref 39.0–52.0)
Lymphs Abs: 1.9 10*3/uL (ref 0.7–4.0)
MCHC: 34.6 g/dL (ref 30.0–36.0)
MCV: 97.5 fl (ref 78.0–100.0)
Monocytes Absolute: 0.5 10*3/uL (ref 0.1–1.0)
Neutrophils Relative %: 50 % (ref 43.0–77.0)
Platelets: 269 10*3/uL (ref 150.0–400.0)
WBC: 4.9 10*3/uL (ref 4.5–10.5)

## 2011-04-12 LAB — ROCKY MTN SPOTTED FVR ABS PNL(IGG+IGM): RMSF IgM: 0.06 IV

## 2011-04-12 LAB — HEPATIC FUNCTION PANEL
ALT: 34 U/L (ref 0–53)
Total Bilirubin: 0.8 mg/dL (ref 0.3–1.2)
Total Protein: 7.4 g/dL (ref 6.0–8.3)

## 2011-04-14 ENCOUNTER — Encounter: Payer: Self-pay | Admitting: Family Medicine

## 2011-04-20 ENCOUNTER — Ambulatory Visit: Payer: Self-pay | Admitting: Family Medicine

## 2011-04-20 ENCOUNTER — Ambulatory Visit (HOSPITAL_COMMUNITY): Payer: BC Managed Care – PPO | Attending: Family Medicine | Admitting: Radiology

## 2011-04-20 DIAGNOSIS — Z8249 Family history of ischemic heart disease and other diseases of the circulatory system: Secondary | ICD-10-CM

## 2011-04-20 DIAGNOSIS — R0989 Other specified symptoms and signs involving the circulatory and respiratory systems: Secondary | ICD-10-CM

## 2011-04-20 DIAGNOSIS — R0602 Shortness of breath: Secondary | ICD-10-CM

## 2011-04-20 DIAGNOSIS — R079 Chest pain, unspecified: Secondary | ICD-10-CM | POA: Insufficient documentation

## 2011-04-20 MED ORDER — TECHNETIUM TC 99M TETROFOSMIN IV KIT
11.0000 | PACK | Freq: Once | INTRAVENOUS | Status: AC | PRN
  Administered 2011-04-20: 11 via INTRAVENOUS

## 2011-04-20 MED ORDER — TECHNETIUM TC 99M TETROFOSMIN IV KIT
33.0000 | PACK | Freq: Once | INTRAVENOUS | Status: AC | PRN
  Administered 2011-04-20: 33 via INTRAVENOUS

## 2011-04-20 NOTE — Progress Notes (Signed)
[facility] SITE 3 NUCLEAR MED [address] [place] [address] [phone]  Cardiology Nuclear Med Study  Gilbert [name] is a 40 y.o. male [id number] Mar 13, 1971   Nuclear Med Background Indication for Stress Test:  Evaluation for Ischemia History:  No previous documented CAD Cardiac Risk Factors: Family History - CAD and Hypertension  Symptoms:  Chest Pain with Exertion, DOE and SOB   Nuclear Pre-Procedure Caffeine/Decaff Intake:  None NPO After: 9:00pm   Lungs:  clear IV 0.9% NS with Angio Cath:  20g  IV Site: L Antecubital  IV Started by:  Milana Na, EMT-P  Chest Size (in):  50 Cup Size: n/a  Height: 6\' 3"  (1.905 m)  Weight:  298 lb (135.172 kg)  BMI:  Body mass index is 37.25 kg/(m^2). Tech Comments:  No Rx this am. Changed to 1 day with patient's height/weight proportion.    Nuclear Med Study 1 or 2 day study: 1 day  Stress Test Type:  Stress  Reading MD: Olga Millers, MD  Order Authorizing Provider: Jeannie Fend. [place]  Resting Radionuclide: Technetium 54m Tetrofosmin  Resting Radionuclide Dose: 11.0 mCi   Stress Radionuclide:  Technetium 17m Tetrofosmin  Stress Radionuclide Dose: 33.0 mCi           Stress Protocol Rest HR: 67 Stress HR: 157  Rest BP: 119/64 Stress BP: 176/66  Exercise Time (min): 5:59 METS: 7.0   Predicted Max HR: 180 bpm % Max HR: 87.22 bpm Rate Pressure Product: 75643   Dose of Adenosine (mg):  n/a Dose of Lexiscan: n/a mg  Dose of Atropine (mg): n/a Dose of Dobutamine: n/a mcg/kg/min (at max HR)  Stress Test Technologist: Milana Na, EMT-P  Nuclear Technologist:  Domenic Polite, CNMT     Rest Procedure:  Myocardial perfusion imaging was performed at rest 45 minutes following the intravenous administration of Technetium 34m Tetrofosmin. Rest ECG: NSR  Stress Procedure:  The patient exercised for 5:59.  The patient stopped due to fatigue and denied any chest pain.  There were no significant ST-T wave changes.   Technetium 88m Tetrofosmin was injected at peak exercise and myocardial perfusion imaging was performed after a brief delay. Stress ECG: No significant ST segment change suggestive of ischemia.  QPS Raw Data Images:  Acquisition technically good; normal left ventricular size. Stress Images:  Normal homogeneous uptake in all areas of the myocardium. Rest Images:  Normal homogeneous uptake in all areas of the myocardium. Subtraction (SDS):  No evidence of ischemia. Transient Ischemic Dilatation (Normal <1.22):  0.98 Lung/Heart Ratio (Normal <0.45):  0.21  Quantitative Gated Spect Images QGS EDV:  102 ml QGS ESV:  31 ml QGS cine images:  NL LV Function; NL Wall Motion QGS EF: 70%  Impression Exercise Capacity:  Fair exercise capacity. BP Response:  Normal blood pressure response. Clinical Symptoms:  No chest pain. ECG Impression:  No significant ST segment change suggestive of ischemia. Comparison with Prior Nuclear Study: No images to compare  Overall Impression:  Normal stress nuclear study.   Olga Millers

## 2011-04-21 NOTE — Progress Notes (Signed)
Nuclear report routed to Dr. Laury Axon. Lew Prout, Farris Has

## 2011-04-26 NOTE — Progress Notes (Signed)
mssg left for a return call    KP  

## 2011-04-28 NOTE — Progress Notes (Signed)
Discuss with patient  

## 2011-04-28 NOTE — Progress Notes (Signed)
mssg left on voicemail     KP 

## 2011-05-03 ENCOUNTER — Ambulatory Visit (INDEPENDENT_AMBULATORY_CARE_PROVIDER_SITE_OTHER): Payer: BC Managed Care – PPO | Admitting: Family Medicine

## 2011-05-03 ENCOUNTER — Encounter: Payer: Self-pay | Admitting: Family Medicine

## 2011-05-03 DIAGNOSIS — I10 Essential (primary) hypertension: Secondary | ICD-10-CM

## 2011-05-03 DIAGNOSIS — R079 Chest pain, unspecified: Secondary | ICD-10-CM

## 2011-05-03 DIAGNOSIS — G4700 Insomnia, unspecified: Secondary | ICD-10-CM

## 2011-05-03 DIAGNOSIS — E669 Obesity, unspecified: Secondary | ICD-10-CM

## 2011-05-03 NOTE — Assessment & Plan Note (Signed)
BP is well controlled.  Asymptomatic.  Check labs.

## 2011-05-03 NOTE — Assessment & Plan Note (Signed)
Reviewed pt's recent stress test w/ him and explained results.  Pt now feels comfortable and understands.

## 2011-05-03 NOTE — Assessment & Plan Note (Signed)
Reviewed that this is likely situational due to work stress.  Encouraged OTC meds prn prior to starting prescription meds.  Will follow.

## 2011-05-03 NOTE — Progress Notes (Signed)
  Subjective:    Patient ID: Gilbert [name], male    DOB: [birth year], 40 y.o.   MRN: [id number]  HPI HTN- chronic problem for pt, well controlled on enalapril and HCTZ.  No SOB, current CP, HAs, visual changes, edema.  Chest pain- pt had normal nuclear study, but isn't sure what that means.  Wants to review study.  Obesity- pt has regained the weight he has lost.  Has attempted multiple different diets- paleo, adkins, weight watchers.  Not currently exercising but considering starting couch to [facility] training.  Thinking about lap band and wants to know my thoughts.  Insomnia- can fall asleep easily but has trouble staying asleep.  Has not tried anything OTC.  Admits to increased stress recently w/ looming work deadlines.  sxs have been present for 2 weeks.   Review of Systems For ROS see HPI     Objective:   Physical Exam  Vitals reviewed. Constitutional: He is oriented to person, place, and time. He appears well-developed and well-nourished. No distress.       obese  HENT:  Head: Normocephalic and atraumatic.  Eyes: Conjunctivae and EOM are normal. Pupils are equal, round, and reactive to light.  Neck: Normal range of motion. Neck supple. No thyromegaly present.  Cardiovascular: Normal rate, regular rhythm, normal heart sounds and intact distal pulses.   No murmur heard. Pulmonary/Chest: Effort normal and breath sounds normal. No respiratory distress.  Abdominal: Soft. Bowel sounds are normal. He exhibits no distension.  Musculoskeletal: He exhibits no edema.  Lymphadenopathy:    He has no cervical adenopathy.  Neurological: He is alert and oriented to person, place, and time. No cranial nerve deficit.  Skin: Skin is warm and dry.  Psychiatric: He has a normal mood and affect. His behavior is normal.          Assessment & Plan:

## 2011-05-03 NOTE — Patient Instructions (Signed)
Schedule your complete physical in 6 months- do not eat before this appt Look into lab band surgery at [email or web address] We'll notify you of your lab results Try and get regular exercise- the couch to 5k would be great! It's fine to use OTC sleep aides for short term sleep problems- if it becomes more of a regular problem we will need to address the underlying anxiety Call with any questions or concerns Hang in there!

## 2011-05-03 NOTE — Assessment & Plan Note (Signed)
Discussed his options at length.  In favor of lap band if pt is aware of possible complications, importance of strict diet, and financial requirements.  Provided pt w/ surgery info and he is to look into it and possibly attend an Chief of Staff.

## 2011-05-04 ENCOUNTER — Telehealth: Payer: Self-pay

## 2011-05-04 LAB — BASIC METABOLIC PANEL
BUN: 19 mg/dL (ref 6–23)
Calcium: 9.3 mg/dL (ref 8.4–10.5)
Chloride: 102 mEq/L (ref 96–112)
Creatinine, Ser: 1.1 mg/dL (ref 0.4–1.5)

## 2011-05-04 NOTE — Telephone Encounter (Signed)
Message copied by Beverely Low on Thu May 04, 2011  4:42 PM ------      Message from: Sheliah Hatch      Created: Thu May 04, 2011 12:52 PM       Labs look good.  No changes

## 2011-05-04 NOTE — Telephone Encounter (Signed)
Left message to call back  

## 2011-05-04 NOTE — Telephone Encounter (Signed)
Labs mailed

## 2011-05-04 NOTE — Telephone Encounter (Signed)
Message copied by Beverely Low on Thu May 04, 2011  1:12 PM ------      Message from: Gilbert [name]      Created: Thu May 04, 2011 12:52 PM       Labs look good.  No changes

## 2011-05-19 ENCOUNTER — Other Ambulatory Visit: Payer: Self-pay | Admitting: Family Medicine

## 2011-05-19 NOTE — Telephone Encounter (Signed)
Last Ov 05-03-11.Please advise

## 2011-05-19 NOTE — Telephone Encounter (Signed)
Patient needs refill for clindamycin - [facility] [address] --- he had an ov [phone] & discussed needing a refill -

## 2011-05-21 ENCOUNTER — Other Ambulatory Visit: Payer: Self-pay | Admitting: Family

## 2011-05-22 MED ORDER — CLINDAMYCIN PHOSPHATE 1 % EX LOTN: TOPICAL_LOTION | Freq: Two times a day (BID) | CUTANEOUS | Status: DC

## 2011-05-22 NOTE — Telephone Encounter (Signed)
Done

## 2011-05-22 NOTE — Telephone Encounter (Signed)
Ok to refill x 3 

## 2011-05-23 ENCOUNTER — Other Ambulatory Visit: Payer: Self-pay | Admitting: Family Medicine

## 2011-06-20 ENCOUNTER — Encounter: Payer: Self-pay | Admitting: Family Medicine

## 2011-06-20 DIAGNOSIS — Z0279 Encounter for issue of other medical certificate: Secondary | ICD-10-CM

## 2011-11-08 ENCOUNTER — Other Ambulatory Visit: Payer: Self-pay | Admitting: Family Medicine

## 2011-11-08 MED ORDER — ENALAPRIL MALEATE 20 MG PO TABS: 20.0000 mg | ORAL_TABLET | Freq: Two times a day (BID) | ORAL | Status: DC

## 2011-11-08 MED ORDER — HYDROCHLOROTHIAZIDE 25 MG PO TABS: 25.0000 mg | ORAL_TABLET | Freq: Every day | ORAL | Status: DC

## 2011-11-08 NOTE — Telephone Encounter (Signed)
Noted last OV 05-03-11, spoke with MD Beverely Low concerning pt hx, last OV notes and current medication request, was given verbal order to send pt #30 in order for pt to have enough time to schedule a CPE Letter has been mailed to pt address noted in the chart to advise they are overdue for cpe/ov/labs and the pt needs to contact office to set up appt

## 2011-11-21 ENCOUNTER — Encounter: Payer: Self-pay | Admitting: Family Medicine

## 2011-11-21 ENCOUNTER — Encounter: Payer: Self-pay | Admitting: *Deleted

## 2011-11-21 ENCOUNTER — Ambulatory Visit (INDEPENDENT_AMBULATORY_CARE_PROVIDER_SITE_OTHER): Payer: BC Managed Care – PPO | Admitting: Family Medicine

## 2011-11-21 DIAGNOSIS — E669 Obesity, unspecified: Secondary | ICD-10-CM

## 2011-11-21 DIAGNOSIS — Z0000 Encounter for general adult medical examination without abnormal findings: Secondary | ICD-10-CM

## 2011-11-21 DIAGNOSIS — I10 Essential (primary) hypertension: Secondary | ICD-10-CM

## 2011-11-21 HISTORY — DX: Encounter for general adult medical examination without abnormal findings: Z00.00

## 2011-11-21 LAB — HEPATIC FUNCTION PANEL
ALT: 38 U/L (ref 0–53)
AST: 29 U/L (ref 0–37)
Alkaline Phosphatase: 77 U/L (ref 39–117)
Bilirubin, Direct: 0 mg/dL (ref 0.0–0.3)
Total Bilirubin: 0.7 mg/dL (ref 0.3–1.2)
Total Protein: 7.6 g/dL (ref 6.0–8.3)

## 2011-11-21 LAB — LIPID PANEL
Cholesterol: 199 mg/dL (ref 0–200)
LDL Cholesterol: 114 mg/dL — ABNORMAL HIGH (ref 0–99)
Total CHOL/HDL Ratio: 3
VLDL: 17.4 mg/dL (ref 0.0–40.0)

## 2011-11-21 LAB — CBC WITH DIFFERENTIAL/PLATELET
Eosinophils Absolute: 0.4 10*3/uL (ref 0.0–0.7)
Eosinophils Relative: 4.8 % (ref 0.0–5.0)
Lymphocytes Relative: 22.3 % (ref 12.0–46.0)
MCV: 98.3 fl (ref 78.0–100.0)
Monocytes Absolute: 0.6 10*3/uL (ref 0.1–1.0)
Neutrophils Relative %: 65.2 % (ref 43.0–77.0)
Platelets: 279 10*3/uL (ref 150.0–400.0)
WBC: 8 10*3/uL (ref 4.5–10.5)

## 2011-11-21 LAB — BASIC METABOLIC PANEL
BUN: 14 mg/dL (ref 6–23)
Calcium: 9.3 mg/dL (ref 8.4–10.5)
Glucose, Bld: 80 mg/dL (ref 70–99)

## 2011-11-21 MED ORDER — HYDROCHLOROTHIAZIDE 25 MG PO TABS: 25.0000 mg | ORAL_TABLET | Freq: Every day | ORAL | Status: DC

## 2011-11-21 MED ORDER — ENALAPRIL MALEATE 20 MG PO TABS: 20.0000 mg | ORAL_TABLET | Freq: Two times a day (BID) | ORAL | Status: DC

## 2011-11-21 NOTE — Assessment & Plan Note (Signed)
Pt's PE WNL w/ exception of obesity.  Check labs.  Anticipatory guidance provided.  

## 2011-11-21 NOTE — Assessment & Plan Note (Signed)
Chronic problem.  Well controlled on current meds.  Asymptomatic.  No changes. 

## 2011-11-21 NOTE — Assessment & Plan Note (Signed)
Unchanged.  Pt has gained 5 lbs since August.  Pt interested in moving forward w/ lap band procedure.  Will assist as able.

## 2011-11-21 NOTE — Progress Notes (Signed)
  Subjective:    Patient ID: Gilbert [name], male    DOB: January 03, 1971, 41 y.o.   MRN: [id number]  HPI CPE- no concerns w/ exception of obesity.  Went to [employer] last fall.  Wants to proceed.  Has started [place] to 5K program   Review of Systems Patient reports no vision/hearing changes, anorexia, fever ,adenopathy, persistant/recurrent hoarseness, swallowing issues, chest pain, palpitations, edema, persistant/recurrent cough, hemoptysis, dyspnea (rest,exertional, paroxysmal nocturnal), gastrointestinal  bleeding (melena, rectal bleeding), abdominal pain, excessive heart burn, GU symptoms (dysuria, hematuria, voiding/incontinence issues) syncope, focal weakness, memory loss, numbness & tingling, skin/hair/nail changes, depression, anxiety, abnormal bruising/bleeding.     Objective:   Physical Exam BP 122/80  Pulse 84  Temp(Src) 98.7 F (37.1 C) (Oral)  Ht 6\' 2"  (1.88 m)  Wt 310 lb 3.2 oz (140.706 kg)  BMI 39.83 kg/m2  SpO2 96%  General Appearance:    Alert, cooperative, no distress, appears stated age, obese  Head:    Normocephalic, without obvious abnormality, atraumatic  Eyes:    PERRL, conjunctiva/corneas clear, EOM's intact, fundi    benign, both eyes       Ears:    Normal TM's and external ear canals, both ears  Nose:   Nares normal, septum midline, mucosa normal, no drainage   or sinus tenderness  Throat:   Lips, mucosa, and tongue normal; teeth and gums normal  Neck:   Supple, symmetrical, trachea midline, no adenopathy;       thyroid:  No enlargement/tenderness/nodules  Back:     Symmetric, no curvature, ROM normal, no CVA tenderness  Lungs:     Clear to auscultation bilaterally, respirations unlabored  Chest wall:    No tenderness or deformity  Heart:    Regular rate and rhythm, S1 and S2 normal, no murmur, rub   or gallop  Abdomen:     Soft, non-tender, bowel sounds active all four quadrants,    no masses, no organomegaly  Genitalia:    Normal male without lesion,  discharge or tenderness  Rectal:    Deferred due to young age  Extremities:   Extremities normal, atraumatic, no cyanosis or edema.  + vericosities bilateral LEs  Pulses:   2+ and symmetric all extremities  Skin:   Skin color, texture, turgor normal, no rashes or lesions  Lymph nodes:   Cervical, supraclavicular, and axillary nodes normal  Neurologic:   CNII-XII intact. Normal strength, sensation and reflexes      throughout          Assessment & Plan:

## 2011-11-21 NOTE — Patient Instructions (Signed)
Follow up in 6 months to recheck BP Let me know what you need for the lap band procedure Call with any questions or concerns [place]!

## 2012-01-01 ENCOUNTER — Telehealth: Payer: Self-pay | Admitting: Family Medicine

## 2012-01-01 NOTE — Telephone Encounter (Signed)
Refill- enalapril maleate 20mg  tab. Take two tablets every day. Qty 60 last fill 1.8.13

## 2012-01-01 NOTE — Telephone Encounter (Signed)
Refill- hydrochlorothiazide 25mg  tab. Take one tablet every day. Qty 30 last fill 1.8.13

## 2012-01-02 MED ORDER — HYDROCHLOROTHIAZIDE 25 MG PO TABS: 25.0000 mg | ORAL_TABLET | Freq: Every day | ORAL | Status: DC

## 2012-01-02 MED ORDER — ENALAPRIL MALEATE 20 MG PO TABS: 20.0000 mg | ORAL_TABLET | Freq: Two times a day (BID) | ORAL | Status: DC

## 2012-01-02 NOTE — Telephone Encounter (Signed)
rx sent to pharmacy by e-script  

## 2012-01-16 ENCOUNTER — Encounter (INDEPENDENT_AMBULATORY_CARE_PROVIDER_SITE_OTHER): Payer: Self-pay | Admitting: General Surgery

## 2012-01-16 ENCOUNTER — Ambulatory Visit (INDEPENDENT_AMBULATORY_CARE_PROVIDER_SITE_OTHER): Payer: BC Managed Care – PPO | Admitting: General Surgery

## 2012-01-16 ENCOUNTER — Telehealth (INDEPENDENT_AMBULATORY_CARE_PROVIDER_SITE_OTHER): Payer: Self-pay

## 2012-01-16 DIAGNOSIS — I10 Essential (primary) hypertension: Secondary | ICD-10-CM

## 2012-01-16 NOTE — Telephone Encounter (Signed)
Called patient to offer earlier appointment time, patient work's in [place] and need's to keep original time for 3:30pm.

## 2012-01-16 NOTE — Progress Notes (Signed)
Patient ID: Gilbert [name], male   DOB: [birth year], 41 y.o.   MRN: [id number]  Chief Complaint  Patient presents with  . Weight Loss Surgery    Lap Band Initial    HPI Gilbert [name] is a 41 y.o. male.  HPI This patient presents for new patient evaluation and initial weight loss surgery consultation. He has a tender information session and is most interested in the LAP-BAND. He says that he is afraid of the gastric bypass because of the surgical risks. He says that he has been athletic through high school and college but since college has steadily gained weight. He has tried several diets in the most affected eye with a low carb diet and Optifast and has been under a physician's supervised diet with Dr. Beverely Low. He does have heartburn which has come up recently and he takes Prilosec daily for treatment of this. As long as he takes Prilosec he did not have any complaints of heartburn.  Past Medical History  Diagnosis Date  . Hypertension   . Chronic back pain   . Ocular rosacea   . GERD (gastroesophageal reflux disease)   . Morbid obesity     Past Surgical History  Procedure Date  . Knee surgery     right  . Lasik     Family History  Problem Relation Age of Onset  . Hypertension Father   . Diabetes Father   . Heart disease Father     pacemaker    Social History History  Substance Use Topics  . Smoking status: Never Smoker   . Smokeless tobacco: Never Used  . Alcohol Use: No     social    Allergies  Allergen Reactions  . Penicillins     Current Outpatient Prescriptions  Medication Sig Dispense Refill  . clindamycin (CLEOCIN T) 1 % lotion Apply topically 2 (two) times daily.  60 mL  3  . enalapril (VASOTEC) 20 MG tablet Take 1 tablet (20 mg total) by mouth 2 (two) times daily.  60 tablet  6  . hydrochlorothiazide (HYDRODIURIL) 25 MG tablet Take 1 tablet (25 mg total) by mouth daily.  30 tablet  6  . hydrocortisone (ANUSOL-HC) 25 MG suppository USE 1 SUPPOSITORY EVERY  6 HOURS AS NEEDED FOR PAIN  60 suppository  0  . omeprazole (PRILOSEC) 20 MG capsule Take 20 mg by mouth daily.          Review of Systems Review of Systems All other review of systems negative or noncontributory except as stated in the HPI Blood pressure 142/88, pulse 76, temperature 97.2 F (36.2 C), resp. rate 16, height 6\' 2"  (1.88 m), weight 312 lb 12.8 oz (141.885 kg).  Physical Exam Physical Exam Physical Exam  Vitals reviewed. Constitutional: He is oriented to person, place, and time. He appears well-developed and well-nourished. No distress.  HENT:  Head: Normocephalic and atraumatic.  Mouth/Throat: No oropharyngeal exudate.  Eyes: Conjunctivae and EOM are normal. Pupils are equal, round, and reactive to light. Right eye exhibits no discharge. Left eye exhibits no discharge. No scleral icterus.  Neck: Normal range of motion. No tracheal deviation present.  Cardiovascular: Normal rate, regular rhythm and normal heart sounds.   Pulmonary/Chest: Effort normal and breath sounds normal. No stridor. No respiratory distress. He has no wheezes. He has no rales. He exhibits no tenderness.  Abdominal: Soft. Bowel sounds are normal. He exhibits no distension and no mass. There is no tenderness. There is no rebound and no  guarding.  Musculoskeletal: Normal range of motion. He exhibits no edema and no tenderness.  Neurological: He is alert and oriented to person, place, and time.  Skin: Skin is warm and dry. No rash noted. He is not diaphoretic. No erythema. No pallor.  Psychiatric: He has a normal mood and affect. His behavior is normal. Judgment and thought content normal.    Data Reviewed labs  Assessment    Morbid obesity with a BMI of 40 and hypertension We had a long discussion regarding all the surgical options for weight loss including the lap band, sleeve gastrectomy, and Roux-en-Y gastric bypass. I think he be a fine candidate for any of the procedures which he chooses. We  discussed the pros and cons and risks and benefits of each of the procedures and he is leaning towards the lap band. He was not familiar with the sleeve gastrectomy prior to this visit so he is going to do some research on this procedure as well and final decision as to what he is interested in. I did explain to him that given his reflux sleeve gastrectomy may not be the best procedure for him if this might worsen his symptoms and make the medication effective. We also discussed the possible risk of converting this to gastric bypass. We discussed the other surgical risks and particular treatment procedure including slippage of band erosion and the need for reoperation as well as anastomotic leaks in staple line leaks and vitamin deficiencies. We will start him on the workup for lap band and we will see him back after his workup is complete.    Plan    I have seen his labs and knee should be adequate for his initial workup.and  We will get an upper GI and nutrition and cytology evaluations and he will see [place] back after this to review the information and set up his surgery.       Lodema Pilot DAVID 01/16/2012, 5:12 PM

## 2012-01-22 ENCOUNTER — Ambulatory Visit: Payer: BC Managed Care – PPO | Admitting: *Deleted

## 2012-01-31 ENCOUNTER — Encounter: Payer: Self-pay | Admitting: *Deleted

## 2012-01-31 ENCOUNTER — Encounter: Payer: BC Managed Care – PPO | Attending: General Surgery | Admitting: *Deleted

## 2012-01-31 VITALS — Ht 75.0 in | Wt 313.5 lb

## 2012-01-31 DIAGNOSIS — Z01818 Encounter for other preprocedural examination: Secondary | ICD-10-CM | POA: Insufficient documentation

## 2012-01-31 DIAGNOSIS — Z713 Dietary counseling and surveillance: Secondary | ICD-10-CM | POA: Insufficient documentation

## 2012-01-31 DIAGNOSIS — E669 Obesity, unspecified: Secondary | ICD-10-CM | POA: Insufficient documentation

## 2012-01-31 NOTE — Progress Notes (Signed)
  Pre-Op Assessment Visit:  Pre-Operative LAGB Surgery  Medical Nutrition Therapy:  Appt start time: 1200 end time:  1300.  Patient was seen on 01/31/2012 for Pre-Operative LAGB Nutrition Assessment. Assessment and letter of approval faxed to [facility] Surgery Bariatric Surgery Program coordinator on 01/31/2012.  Approval letter and Candace Gallus results sent to [facility] and will be available in the chart under the media tab.  TANITA  BODY COMP RESULTS  01/31/12  %Fat 46.9%  FM (lbs) 147.0  FFM (lbs) 166.5  TBW (lbs) 122.0   Handouts given during visit include:  Pre-Op Goals   Bariatric Protein Shakes   Patient to call for Pre-Op and Post-Op Nutrition Education at the [facility] when surgery is scheduled.

## 2012-01-31 NOTE — Patient Instructions (Addendum)
   Follow Pre-Op Nutrition Goals to prepare for Lap Band Surgery.   Call the [facility] at [phone] once you have been given your surgery date to enrolled in the Pre-Op Nutrition Class. You will need to attend this nutrition class 3-4 weeks prior to your surgery.  

## 2012-02-05 ENCOUNTER — Ambulatory Visit (HOSPITAL_COMMUNITY)
Admission: RE | Admit: 2012-02-05 | Discharge: 2012-02-05 | Disposition: A | Discharge status: Home / Self Care | Payer: BC Managed Care – PPO | Source: Ambulatory Visit | Attending: General Surgery | Admitting: General Surgery

## 2012-02-05 DIAGNOSIS — L719 Rosacea, unspecified: Secondary | ICD-10-CM | POA: Insufficient documentation

## 2012-02-05 DIAGNOSIS — Z6841 Body Mass Index (BMI) 40.0 and over, adult: Secondary | ICD-10-CM | POA: Insufficient documentation

## 2012-02-05 DIAGNOSIS — K219 Gastro-esophageal reflux disease without esophagitis: Secondary | ICD-10-CM | POA: Insufficient documentation

## 2012-02-05 DIAGNOSIS — M549 Dorsalgia, unspecified: Secondary | ICD-10-CM | POA: Insufficient documentation

## 2012-02-05 DIAGNOSIS — I10 Essential (primary) hypertension: Secondary | ICD-10-CM | POA: Insufficient documentation

## 2012-02-23 ENCOUNTER — Ambulatory Visit (INDEPENDENT_AMBULATORY_CARE_PROVIDER_SITE_OTHER): Payer: BC Managed Care – PPO | Admitting: Surgery

## 2012-02-23 ENCOUNTER — Encounter (INDEPENDENT_AMBULATORY_CARE_PROVIDER_SITE_OTHER): Payer: Self-pay | Admitting: Surgery

## 2012-02-23 VITALS — BP 122/74 | HR 78 | Temp 97.0°F | Ht 75.0 in | Wt 316.8 lb

## 2012-02-23 DIAGNOSIS — E669 Obesity, unspecified: Secondary | ICD-10-CM

## 2012-02-23 NOTE — Progress Notes (Signed)
Patient ID: Gilbert [name], male   DOB: 26-Feb-1971, 41 y.o.   MRN: [id number]  Chief Complaint  Patient presents with  . Bariatric Pre-op    HPI [name] is a 41 y.o. male.  HPI This patient presents for new patient evaluation and initial weight loss surgery consultation. He has a tender information session and is most interested in the LAP-BAND. He says that he is afraid of the gastric bypass because of the surgical risks. He says that he has been athletic through high school and college but since college has steadily gained weight. He has tried several diets in the most affected eye with a low carb diet and Optifast and has been under a physician's supervised diet with Dr. Beverely Low. He does have heartburn which has come up recently and he takes Prilosec daily for treatment of this. As long as he takes Prilosec he did not have any complaints of heartburn.  Past Medical History  Diagnosis Date  . Hypertension   . Chronic back pain   . Ocular rosacea   . GERD (gastroesophageal reflux disease)   . Morbid obesity     Past Surgical History  Procedure Date  . Knee surgery     right  . Lasik     Family History  Problem Relation Age of Onset  . Hypertension Father   . Diabetes Father   . Heart disease Father     pacemaker    Social History History  Substance Use Topics  . Smoking status: Never Smoker   . Smokeless tobacco: Never Used  . Alcohol Use: No     social    Allergies  Allergen Reactions  . Penicillins     Current Outpatient Prescriptions  Medication Sig Dispense Refill  . clindamycin (CLEOCIN T) 1 % lotion Apply topically 2 (two) times daily.  60 mL  3  . cycloSPORINE (RESTASIS) 0.05 % ophthalmic emulsion Place 1 drop into both eyes 2 (two) times daily.      Marland Kitchen DOXYCYCLINE HYCLATE PO Take 50 mg by mouth 2 (two) times daily.      . enalapril (VASOTEC) 20 MG tablet Take 1 tablet (20 mg total) by mouth 2 (two) times daily.  60 tablet  6  . hydrochlorothiazide  (HYDRODIURIL) 25 MG tablet Take 1 tablet (25 mg total) by mouth daily.  30 tablet  6  . hydrocortisone (ANUSOL-HC) 25 MG suppository USE 1 SUPPOSITORY EVERY 6 HOURS AS NEEDED FOR PAIN  60 suppository  0  . omeprazole (PRILOSEC) 20 MG capsule Take 20 mg by mouth daily.          Review of Systems Review of Systems All other review of systems negative or noncontributory except as stated in the HPI Blood pressure 122/74, pulse 78, temperature 97 F (36.1 C), temperature source Temporal, height 6\' 3"  (1.905 m), weight 316 lb 12.8 oz (143.7 kg), SpO2 98.00%.  Physical Exam Physical Exam Physical Exam  Vitals reviewed. Constitutional: He is oriented to person, place, and time. He appears well-developed and well-nourished. No distress.  HENT:  Head: Normocephalic and atraumatic.  Mouth/Throat: No oropharyngeal exudate.  Eyes: Conjunctivae and EOM are normal. Pupils are equal, round, and reactive to light. Right eye exhibits no discharge. Left eye exhibits no discharge. No scleral icterus.  Neck: Normal range of motion. No tracheal deviation present.  Cardiovascular: Normal rate, regular rhythm and normal heart sounds.   Pulmonary/Chest: Effort normal and breath sounds normal. No stridor. No respiratory distress. He  has no wheezes. He has no rales. He exhibits no tenderness.  Abdominal: Soft. Bowel sounds are normal. He exhibits no distension and no mass. There is no tenderness. There is no rebound and no guarding.  Musculoskeletal: Normal range of motion. He exhibits no edema and no tenderness.  Neurological: He is alert and oriented to person, place, and time.  Skin: Skin is warm and dry. No rash noted. He is not diaphoretic. No erythema. No pallor.  Psychiatric: He has a normal mood and affect. His behavior is normal. Judgment and thought content normal.    Data Reviewed labs  Assessment    Morbid obesity with a BMI of 40 and hypertension We had a long discussion regarding all the  surgical options for weight loss including the lap band, sleeve gastrectomy, and Roux-en-Y gastric bypass. I think he be a fine candidate for any of the procedures which he chooses. We discussed the pros and cons and risks and benefits of each of the procedures and he is leaning towards the lap band. He was not familiar with the sleeve gastrectomy prior to this visit so he is going to do some research on this procedure as well and final decision as to what he is interested in. I did explain to him that given his reflux sleeve gastrectomy may not be the best procedure for him if this might worsen his symptoms and make the medication effective. We also discussed the possible risk of converting this to gastric bypass. We discussed the other surgical risks and particular treatment procedure including slippage of band erosion and the need for reoperation as well as anastomotic leaks in staple line leaks and vitamin deficiencies. We will start him on the workup for lap band and we will see him back after his workup is complete.    Plan    I have seen his labs and knee should be adequate for his initial workup.and  We will get an upper GI and nutrition and cytology evaluations and he will see [place] back after this to review the information and set up his surgery.       Sheetal Lyall B 02/23/2012, 4:26 PM   [name] Made an appointment to see me regarding his desire for a lap band. They have really done all the workup in the anyone like the band placed in mid July. He had requested that I do the surgery and hence he directed himself to me.  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 Based on its size he may need an APL band. He has some reflux issues for which he takes Prilosec but his upper GI did not show a hiatal hernia or reflux reflux.  Internist chart over the Okey Regal to get him scheduled for lapband in July

## 2012-04-04 ENCOUNTER — Encounter: Payer: BC Managed Care – PPO | Attending: General Surgery | Admitting: *Deleted

## 2012-04-04 VITALS — Ht 75.0 in | Wt 313.0 lb

## 2012-04-04 DIAGNOSIS — Z713 Dietary counseling and surveillance: Secondary | ICD-10-CM | POA: Insufficient documentation

## 2012-04-04 DIAGNOSIS — E669 Obesity, unspecified: Secondary | ICD-10-CM | POA: Insufficient documentation

## 2012-04-04 DIAGNOSIS — Z01818 Encounter for other preprocedural examination: Secondary | ICD-10-CM | POA: Insufficient documentation

## 2012-04-06 ENCOUNTER — Encounter: Payer: Self-pay | Admitting: *Deleted

## 2012-04-06 NOTE — Progress Notes (Addendum)
  Bariatric Class:  Appt start time: 1830 end time:  1930.  Pre-Operative Nutrition Class  Patient was seen on 04/04/12 for Pre-Operative [facility]ariatric Surgery [facility]   Surgery date: 04/23/12 Surgery type: LAGB  Weight today: 313.0 lbs Weight change: n/a Total weight lost: n/a BMI: 41.3 kg/m^2  Samples given per MNT protocol: Bariatric Advantage Multivitamin Lot #  [id number] Exp: 09/13  Bariatric Advantage Calcium Citrate Lot # [id number] Exp: 09/13  Celebrate Vitamins Multivitamin Lot # [id number] Exp: 11/14  Celebrate Vitamins Calcium Citrate Lot # [id number] Exp: 10/14  Unjury Protein Powder (Chicken Soup Flavor) Lot # [id number] Exp: 09/14  The following the learning objective met by the patient during this course:  Identifies Pre-Op Dietary Goals and will begin 2 weeks pre-operatively  Identifies appropriate sources of fluids and proteins   States protein recommendations and appropriate sources pre and post-operatively  Identifies Post-Operative Dietary Goals and will follow for 2 weeks post-operatively  Identifies appropriate multivitamin and calcium sources  Describes the need for physical activity post-operatively and will follow MD recommendations  States when to call healthcare provider regarding medication questions or post-operative complications  Handouts given during class include:  Pre-Op Bariatric Surgery Diet Handout  Protein Shake Handout  Post-Op Bariatric Surgery Nutrition Handout  BELT Program Information Flyer  Support Group Information Flyer  Follow-Up Plan: Patient will follow-up at [facility] 2 weeks post operatively for diet advancement per MD.

## 2012-04-06 NOTE — Patient Instructions (Addendum)
Follow:   Pre-Op Diet per MD 2 weeks prior to surgery  Phase 2- Liquids (clear/full) 2 weeks after surgery  Vitamin/Mineral/Calcium guidelines for purchasing bariatric supplements  Exercise guidelines pre and post-op per MD  Follow-up at [facility] in 2 weeks post-op for diet advancement. Contact Gurjit Loconte as needed with questions/concerns. 

## 2012-04-11 ENCOUNTER — Ambulatory Visit (INDEPENDENT_AMBULATORY_CARE_PROVIDER_SITE_OTHER): Payer: BC Managed Care – PPO | Admitting: Family Medicine

## 2012-04-11 ENCOUNTER — Encounter: Payer: Self-pay | Admitting: Family Medicine

## 2012-04-11 VITALS — BP 124/80 | HR 84 | Temp 98.6°F | Ht 74.75 in | Wt 309.6 lb

## 2012-04-11 DIAGNOSIS — Z01818 Encounter for other preprocedural examination: Secondary | ICD-10-CM

## 2012-04-11 NOTE — Progress Notes (Signed)
Subjective:    Gilbert [name] is a 41 y.o. male who presents to the office today for a preoperative consultation at the request of surgeon Dr Daphine Deutscher who plans on performing lap band on July 30. This consultation is requested for the specific conditions prompting preoperative evaluation (i.e. because of potential affect on operative risk): HTN, obesity. Planned anesthesia: general. The patient has the following known anesthesia issues: none. Patients bleeding risk: no recent abnormal bleeding, no remote history of abnormal bleeding and no use of Ca-channel blockers. Patient does not have objections to receiving blood products if needed.  The following portions of the patient's history were reviewed and updated as appropriate: allergies, current medications, past family history, past medical history, past social history, past surgical history and problem list.  Review of Systems A comprehensive review of systems was negative.    Objective:        Predictors of intubation difficulty:  Morbid obesity? yes  Anatomically abnormal facies? no  Prominent incisors? no  Receding mandible? no  Short, thick neck? yes   Neck range of motion: normal  Mallampati score: NA  Thyromental distance: NA  Dentition: No chipped, loose, or missing teeth.  Cardiographics ECG: normal sinus rhythm, no blocks or conduction defects, no ischemic changes Echocardiogram: not done  Imaging Chest x-ray: NA   Lab Review  Office Visit on 11/21/2011  Component Date Value  . WBC 11/21/2011 8.0   . RBC 11/21/2011 4.51   . Hemoglobin 11/21/2011 15.1   . HCT 11/21/2011 44.3   . MCV 11/21/2011 98.3   . MCHC 11/21/2011 34.0   . RDW 11/21/2011 12.8   . Platelets 11/21/2011 279.0   . Neutrophils Relative 11/21/2011 65.2   . Lymphocytes Relative 11/21/2011 22.3   . Monocytes Relative 11/21/2011 7.2   . Eosinophils Relative 11/21/2011 4.8   . Basophils Relative 11/21/2011 0.5   . Neutro Abs 11/21/2011 5.2   .  Lymphs Abs 11/21/2011 1.8   . Monocytes Absolute 11/21/2011 0.6   . Eosinophils Absolute 11/21/2011 0.4   . Basophils Absolute 11/21/2011 0.0   . Sodium 11/21/2011 136   . Potassium 11/21/2011 4.1   . Chloride 11/21/2011 99   . CO2 11/21/2011 29   . Glucose, Bld 11/21/2011 80   . BUN 11/21/2011 14   . Creatinine, Ser 11/21/2011 0.9   . Calcium 11/21/2011 9.3   . GFR 11/21/2011 94.99   . Total Bilirubin 11/21/2011 0.7   . Bilirubin, Direct 11/21/2011 0.0   . Alkaline Phosphatase 11/21/2011 77   . AST 11/21/2011 29   . ALT 11/21/2011 38   . Total Protein 11/21/2011 7.6   . Albumin 11/21/2011 4.6   . TSH 11/21/2011 1.64   . Cholesterol 11/21/2011 199   . Triglycerides 11/21/2011 87.0   . HDL 11/21/2011 67.20   . VLDL 11/21/2011 17.4   . LDL Cholesterol 11/21/2011 114*  . Total CHOL/HDL Ratio 11/21/2011 3       Assessment:      41 y.o. male with planned surgery as above.   Known risk factors for perioperative complications: None   Difficulty with intubation is not anticipated.  Current medications which may produce withdrawal symptoms if withheld perioperatively: none    Plan:    1. Preoperative workup as follows none. 2. Change in medication regimen before surgery: none, continue medication regimen including morning of surgery, with sip of water. 3. Prophylaxis for cardiac events with perioperative beta-blockers: not indicated. 4. Invasive hemodynamic monitoring perioperatively:  not indicated. 5. Deep vein thrombosis prophylaxis postoperatively:regimen to be chosen by surgical team. 6. Surveillance for postoperative MI with ECG immediately postoperatively and on postoperative days 1 and 2 AND troponin levels 24 hours postoperatively and on day 4 or hospital discharge (whichever comes first): at the discretion of anesthesiologist. 7. Other measures: none

## 2012-04-11 NOTE — Progress Notes (Signed)
NEED PRE OP ORDERS PLEASE-   PST APPT 04/16/12   Thanks

## 2012-04-11 NOTE — Patient Instructions (Addendum)
Everything looks great! I'll fax the note to Dr Daphine Deutscher Call with any questions or concerns GOOD LUCK!!

## 2012-04-12 ENCOUNTER — Other Ambulatory Visit (INDEPENDENT_AMBULATORY_CARE_PROVIDER_SITE_OTHER): Payer: Self-pay | Admitting: Surgery

## 2012-04-15 ENCOUNTER — Encounter (HOSPITAL_COMMUNITY): Payer: Self-pay | Admitting: Pharmacy Technician

## 2012-04-15 NOTE — Patient Instructions (Addendum)
20 Gilbert [name]  04/15/2012   Your procedure is scheduled on:  04-23-12 at 1:00 PM  Report to SHORT STAY DEPT  at 10:30 AM.  Call this number if you have problems the morning of surgery: [phone]   Remember:   Do not eat food or drink liquids AFTER MIDNIGHT  May have clear liquids UNTIL 6 HOURS BEFORE SURGERY (7:00 AM)  Clear liquids include soda, tea, black coffee, apple or grape juice, broth.  Take these medicines the morning of surgery with A SIP OF WATER: DOXYCYCLINE / PRILOSEC   Do not wear jewelry, make-up or nail polish.  Do not wear lotions, powders, or perfumes.   Do not shave legs or underarms 48 hrs. before surgery (men may shave face)  Do not bring valuables to the hospital.  Contacts, dentures or bridgework may not be worn into surgery.  Leave suitcase in the car. After surgery it may be brought to your room.  For patients admitted to the hospital, checkout time is 11:00 AM the day of discharge.   Patients discharged the day of surgery will not be allowed to drive home.    Special Instructions:   Please read over the following fact sheets that you were given: MRSA  Information               SHOWER WITH BETASEPT THE NIGHT BEFORE SURGERY AND THE MORNING OF SURGERY

## 2012-04-16 ENCOUNTER — Encounter (HOSPITAL_COMMUNITY)
Admission: RE | Admit: 2012-04-16 | Discharge: 2012-04-16 | Disposition: A | Discharge status: Home / Self Care | Payer: BC Managed Care – PPO | Source: Ambulatory Visit | Attending: Surgery | Admitting: Surgery

## 2012-04-16 ENCOUNTER — Encounter (HOSPITAL_COMMUNITY): Payer: Self-pay

## 2012-04-16 ENCOUNTER — Ambulatory Visit (HOSPITAL_COMMUNITY)
Admission: RE | Admit: 2012-04-16 | Discharge: 2012-04-16 | Disposition: A | Discharge status: Home / Self Care | Payer: BC Managed Care – PPO | Source: Ambulatory Visit | Attending: Surgery | Admitting: Surgery

## 2012-04-16 DIAGNOSIS — I10 Essential (primary) hypertension: Secondary | ICD-10-CM | POA: Insufficient documentation

## 2012-04-16 DIAGNOSIS — Z01812 Encounter for preprocedural laboratory examination: Secondary | ICD-10-CM | POA: Insufficient documentation

## 2012-04-16 HISTORY — DX: Inflammatory liver disease, unspecified: K75.9

## 2012-04-16 LAB — COMPREHENSIVE METABOLIC PANEL
AST: 37 U/L (ref 0–37)
Alkaline Phosphatase: 83 U/L (ref 39–117)
BUN: 21 mg/dL (ref 6–23)
CO2: 24 mEq/L (ref 19–32)
Chloride: 94 mEq/L — ABNORMAL LOW (ref 96–112)
Creatinine, Ser: 0.85 mg/dL (ref 0.50–1.35)
GFR calc non Af Amer: >90 mL/min (ref >90)
Potassium: 3.3 mEq/L — ABNORMAL LOW (ref 3.5–5.1)
Total Bilirubin: 0.6 mg/dL (ref 0.3–1.2)

## 2012-04-16 LAB — CBC
HCT: 41.9 % (ref 39.0–52.0)
Hemoglobin: 14.8 g/dL (ref 13.0–17.0)
MCH: 32.5 pg (ref 26.0–34.0)
MCHC: 35.3 g/dL (ref 30.0–36.0)
MCV: 91.9 fL (ref 78.0–100.0)

## 2012-04-16 NOTE — Progress Notes (Signed)
04/16/12 0932  OBSTRUCTIVE SLEEP APNEA  Have you ever been diagnosed with sleep apnea through a sleep study? No  Do you snore loudly (loud enough to be heard through closed doors)?  1  Do you often feel tired, fatigued, or sleepy during the daytime? 1  Has anyone observed you stop breathing during your sleep? 0  Do you have, or are you being treated for high blood pressure? 1  BMI more than 35 kg/m2? 1  Age over 42 years old? 0  Neck circumference greater than 40 cm/18 inches? 1  Gender: 1  Obstructive Sleep Apnea Score 6   Score 4 or greater  Updated health history;Results sent to PCP

## 2012-04-18 ENCOUNTER — Encounter (INDEPENDENT_AMBULATORY_CARE_PROVIDER_SITE_OTHER): Payer: Self-pay | Admitting: Surgery

## 2012-04-18 ENCOUNTER — Ambulatory Visit (INDEPENDENT_AMBULATORY_CARE_PROVIDER_SITE_OTHER): Payer: BC Managed Care – PPO | Admitting: Surgery

## 2012-04-18 VITALS — BP 144/78 | HR 98 | Temp 97.7°F | Resp 18 | Ht 75.0 in | Wt 299.0 lb

## 2012-04-18 DIAGNOSIS — Z6837 Body mass index (BMI) 37.0-37.9, adult: Secondary | ICD-10-CM

## 2012-04-18 DIAGNOSIS — E669 Obesity, unspecified: Secondary | ICD-10-CM

## 2012-04-18 NOTE — Patient Instructions (Addendum)

## 2012-04-18 NOTE — Progress Notes (Signed)
Chief Complaint:  Obesity wt 299, 6'3"  History of Present Illness:  Gilbert [name] is an 41 y.o. male has been evaluated for lapband surgery.  UGI showed no hiatus hernia.  He comes in today for a   Past Medical History  Diagnosis Date  . Hypertension   . Chronic back pain   . Ocular rosacea   . GERD (gastroesophageal reflux disease)   . Morbid obesity   . Hepatitis     unknown type many yrs ago - no residual problems  . Sleep apnea     STOP BANG SCORE 6    Past Surgical History  Procedure Date  . Knee surgery     right  . Lasik     Current Outpatient Prescriptions  Medication Sig Dispense Refill  . clindamycin (CLEOCIN T) 1 % lotion Apply topically every other day.      . cycloSPORINE (RESTASIS) 0.05 % ophthalmic emulsion Place 1 drop into both eyes every morning.       Marland Kitchen DOXYCYCLINE HYCLATE PO Take 50 mg by mouth every morning.       . enalapril (VASOTEC) 20 MG tablet Take 40 mg by mouth every morning.      . hydrochlorothiazide (HYDRODIURIL) 25 MG tablet Take 25 mg by mouth every morning.      Marland Kitchen ibuprofen (ADVIL,MOTRIN) 200 MG tablet Take 400 mg by mouth every 6 (six) hours as needed. For back pain      . omeprazole (PRILOSEC) 20 MG capsule Take 20 mg by mouth every morning.        Penicillins Family History  Problem Relation Age of Onset  . Hypertension Father   . Diabetes Father   . Heart disease Father     pacemaker   Social History:   reports that he has never smoked. He has never used smokeless tobacco. He reports that he drinks alcohol. He reports that he does not use illicit drugs.   REVIEW OF SYSTEMS - PERTINENT POSITIVES ONLY: negative  Physical Exam:   Blood pressure 144/78, pulse 98, temperature 97.7 F (36.5 C), temperature source Temporal, resp. rate 18, height 6\' 3"  (1.905 m), weight 299 lb (135.626 kg). Body mass index is 37.37 kg/(m^2).  Gen:  WDWN WM NAD  Neurological: Alert and oriented to person, place, and time. Motor and sensory function  is grossly intact  Head: Normocephalic and atraumatic.  Eyes: Conjunctivae are normal. Pupils are equal, round, and reactive to light. No scleral icterus.  Neck: Normal range of motion. Neck supple. No tracheal deviation or thyromegaly present.  Cardiovascular:  SR without murmurs or gallops.  No carotid bruits Respiratory: Effort normal.  No respiratory distress. No chest wall tenderness. Breath sounds normal.  No wheezes, rales or rhonchi.  Abdomen:  nontender GU: Musculoskeletal: Normal range of motion. Extremities are nontender. No cyanosis, edema or clubbing noted Lymphadenopathy: No cervical, preauricular, postauricular or axillary adenopathy is present Skin: Skin is warm and dry. No rash noted. No diaphoresis. No erythema. No pallor. Pscyh: Normal mood and affect. Behavior is normal. Judgment and thought content normal.   LABORATORY RESULTS: No results found for this or any previous visit (from the past 48 hour(s)).  RADIOLOGY RESULTS: No results found.  Problem List: Patient Active Problem List  Diagnosis  . OBESITY  . HYPERTENSION  . EXTERNAL HEMORRHOIDS  . SINUSITIS- ACUTE-NOS  . URI  . GERD  . GASTROENTERITIS  . BACK PAIN, CHRONIC  . ACHILLES TENDINITIS  . CHEST PAIN  .  CHEST WALL PAIN, ANTERIOR  . Insomnia  . General medical examination    Assessment & Plan: Obesity.  For lapband.  No evidence of hiatus hernia    [name], MD, [facility] [phone] beeper [phone]  04/18/2012 9:56 AM

## 2012-04-23 ENCOUNTER — Encounter (HOSPITAL_COMMUNITY): Payer: Self-pay | Admitting: Anesthesiology

## 2012-04-23 ENCOUNTER — Ambulatory Visit (HOSPITAL_COMMUNITY): Payer: BC Managed Care – PPO | Admitting: Anesthesiology

## 2012-04-23 ENCOUNTER — Encounter (HOSPITAL_COMMUNITY)
Admission: RE | Disposition: A | Discharge status: Home / Self Care | Payer: Self-pay | Source: Ambulatory Visit | Attending: Surgery

## 2012-04-23 ENCOUNTER — Encounter (HOSPITAL_COMMUNITY): Payer: Self-pay | Admitting: *Deleted

## 2012-04-23 ENCOUNTER — Telehealth (INDEPENDENT_AMBULATORY_CARE_PROVIDER_SITE_OTHER): Payer: Self-pay

## 2012-04-23 ENCOUNTER — Observation Stay (HOSPITAL_COMMUNITY)
Admission: RE | Admit: 2012-04-23 | Discharge: 2012-04-24 | DRG: 288 | Disposition: A | Discharge status: Home / Self Care | Payer: BC Managed Care – PPO | Source: Ambulatory Visit | Attending: Surgery | Admitting: Surgery

## 2012-04-23 DIAGNOSIS — E669 Obesity, unspecified: Secondary | ICD-10-CM

## 2012-04-23 DIAGNOSIS — K210 Gastro-esophageal reflux disease with esophagitis: Secondary | ICD-10-CM

## 2012-04-23 DIAGNOSIS — G4730 Sleep apnea, unspecified: Secondary | ICD-10-CM | POA: Insufficient documentation

## 2012-04-23 DIAGNOSIS — I10 Essential (primary) hypertension: Secondary | ICD-10-CM | POA: Insufficient documentation

## 2012-04-23 DIAGNOSIS — K219 Gastro-esophageal reflux disease without esophagitis: Secondary | ICD-10-CM | POA: Insufficient documentation

## 2012-04-23 DIAGNOSIS — K449 Diaphragmatic hernia without obstruction or gangrene: Secondary | ICD-10-CM | POA: Insufficient documentation

## 2012-04-23 DIAGNOSIS — Z6837 Body mass index (BMI) 37.0-37.9, adult: Secondary | ICD-10-CM | POA: Insufficient documentation

## 2012-04-23 DIAGNOSIS — Z9884 Bariatric surgery status: Secondary | ICD-10-CM

## 2012-04-23 DIAGNOSIS — Z79899 Other long term (current) drug therapy: Secondary | ICD-10-CM | POA: Insufficient documentation

## 2012-04-23 HISTORY — PX: HIATAL HERNIA REPAIR: SHX195

## 2012-04-23 HISTORY — PX: LAPAROSCOPIC GASTRIC BANDING: SHX1100

## 2012-04-23 LAB — CREATININE, SERUM
Creatinine, Ser: 0.79 mg/dL (ref 0.50–1.35)
GFR calc Af Amer: >90 mL/min (ref >90)
GFR calc non Af Amer: >90 mL/min (ref >90)

## 2012-04-23 LAB — CBC
HCT: 37.4 % — ABNORMAL LOW (ref 39.0–52.0)
Platelets: 280 10*3/uL (ref 150–400)
RDW: 11.8 % (ref 11.5–15.5)
WBC: 10.9 10*3/uL — ABNORMAL HIGH (ref 4.0–10.5)

## 2012-04-23 SURGERY — GASTRIC BANDING, LAPAROSCOPIC
Anesthesia: General | Site: Abdomen | Wound class: Clean

## 2012-04-23 MED ORDER — HYDROMORPHONE HCL PF 1 MG/ML IJ SOLN
INTRAMUSCULAR | Status: AC
  Filled 2012-04-23: qty 1

## 2012-04-23 MED ORDER — UNJURY VANILLA POWDER: 2.0000 [oz_av] | Freq: Four times a day (QID) | ORAL | Status: DC

## 2012-04-23 MED ORDER — HYDROMORPHONE HCL PF 1 MG/ML IJ SOLN
0.2500 mg | INTRAMUSCULAR | Status: DC | PRN
  Administered 2012-04-23 (×2): 0.5 mg via INTRAVENOUS

## 2012-04-23 MED ORDER — HEPARIN SODIUM (PORCINE) 5000 UNIT/ML IJ SOLN
5000.0000 [IU] | INTRAMUSCULAR | Status: AC
  Administered 2012-04-23: 5000 [IU] via SUBCUTANEOUS
  Filled 2012-04-23: qty 1

## 2012-04-23 MED ORDER — ONDANSETRON HCL 4 MG/2ML IJ SOLN
INTRAMUSCULAR | Status: DC | PRN
  Administered 2012-04-23: 4 mg via INTRAVENOUS

## 2012-04-23 MED ORDER — LIDOCAINE HCL (CARDIAC) 20 MG/ML IV SOLN
INTRAVENOUS | Status: DC | PRN
  Administered 2012-04-23: 75 mg via INTRAVENOUS

## 2012-04-23 MED ORDER — DEXTROSE 5 % IV SOLN
2.0000 g | INTRAVENOUS | Status: AC
  Administered 2012-04-23: 2 g via INTRAVENOUS

## 2012-04-23 MED ORDER — ACETAMINOPHEN 10 MG/ML IV SOLN
INTRAVENOUS | Status: AC
  Filled 2012-04-23: qty 100

## 2012-04-23 MED ORDER — LACTATED RINGERS IV SOLN: INTRAVENOUS | Status: DC

## 2012-04-23 MED ORDER — CEFOXITIN SODIUM-DEXTROSE 1-4 GM-% IV SOLR (PREMIX)
INTRAVENOUS | Status: AC
  Filled 2012-04-23: qty 100

## 2012-04-23 MED ORDER — MORPHINE SULFATE 2 MG/ML IJ SOLN
2.0000 mg | INTRAMUSCULAR | Status: DC | PRN
  Administered 2012-04-23: 4 mg via INTRAVENOUS
  Administered 2012-04-23: 6 mg via INTRAVENOUS
  Administered 2012-04-23: 4 mg via INTRAVENOUS
  Administered 2012-04-23: 2 mg via INTRAVENOUS
  Administered 2012-04-24: 4 mg via INTRAVENOUS
  Administered 2012-04-24: 6 mg via INTRAVENOUS
  Administered 2012-04-24: 4 mg via INTRAVENOUS
  Filled 2012-04-23: qty 3
  Filled 2012-04-23 (×4): qty 2
  Filled 2012-04-23: qty 3
  Filled 2012-04-23: qty 1

## 2012-04-23 MED ORDER — ACETAMINOPHEN 160 MG/5ML PO SOLN: 650.0000 mg | ORAL | Status: DC | PRN

## 2012-04-23 MED ORDER — OXYCODONE-ACETAMINOPHEN 5-325 MG/5ML PO SOLN: 5.0000 mL | ORAL | Status: DC | PRN

## 2012-04-23 MED ORDER — GLYCOPYRROLATE 0.2 MG/ML IJ SOLN
INTRAMUSCULAR | Status: DC | PRN
  Administered 2012-04-23: 0.6 mg via INTRAVENOUS

## 2012-04-23 MED ORDER — NEOSTIGMINE METHYLSULFATE 1 MG/ML IJ SOLN
INTRAMUSCULAR | Status: DC | PRN
  Administered 2012-04-23: 4 mg via INTRAVENOUS

## 2012-04-23 MED ORDER — LACTATED RINGERS IV SOLN
INTRAVENOUS | Status: DC
  Administered 2012-04-23: 14:00:00 via INTRAVENOUS
  Administered 2012-04-23: 1000 mL via INTRAVENOUS

## 2012-04-23 MED ORDER — ENALAPRIL MALEATE 20 MG PO TABS
40.0000 mg | ORAL_TABLET | Freq: Every morning | ORAL | Status: DC
Start: 2012-04-24 — End: 2012-04-24
  Administered 2012-04-24: 40 mg via ORAL
  Filled 2012-04-23 (×2): qty 2

## 2012-04-23 MED ORDER — UNJURY CHOCOLATE CLASSIC POWDER: 2.0000 [oz_av] | Freq: Four times a day (QID) | ORAL | Status: DC

## 2012-04-23 MED ORDER — KCL IN DEXTROSE-NACL 20-5-0.45 MEQ/L-%-% IV SOLN
INTRAVENOUS | Status: DC
  Administered 2012-04-23 – 2012-04-24 (×2): via INTRAVENOUS
  Filled 2012-04-23 (×4): qty 1000

## 2012-04-23 MED ORDER — PROPOFOL 10 MG/ML IV BOLUS
INTRAVENOUS | Status: DC | PRN
  Administered 2012-04-23: 250 mg via INTRAVENOUS

## 2012-04-23 MED ORDER — UNJURY CHICKEN SOUP POWDER
2.0000 [oz_av] | Freq: Four times a day (QID) | ORAL | Status: DC
  Administered 2012-04-24: 2 [oz_av] via ORAL

## 2012-04-23 MED ORDER — ACETAMINOPHEN 10 MG/ML IV SOLN
INTRAVENOUS | Status: DC | PRN
  Administered 2012-04-23: 1000 mg via INTRAVENOUS

## 2012-04-23 MED ORDER — SODIUM CHLORIDE 0.9 % IJ SOLN
INTRAMUSCULAR | Status: DC | PRN
  Administered 2012-04-23: 20 mL via INTRAVENOUS

## 2012-04-23 MED ORDER — FENTANYL CITRATE 0.05 MG/ML IJ SOLN
INTRAMUSCULAR | Status: DC | PRN
  Administered 2012-04-23: 100 ug via INTRAVENOUS
  Administered 2012-04-23: 150 ug via INTRAVENOUS
  Administered 2012-04-23: 100 ug via INTRAVENOUS

## 2012-04-23 MED ORDER — ONDANSETRON HCL 4 MG/2ML IJ SOLN: 4.0000 mg | INTRAMUSCULAR | Status: DC | PRN

## 2012-04-23 MED ORDER — LACTATED RINGERS IV SOLN
INTRAVENOUS | Status: DC | PRN
  Administered 2012-04-23: 1000 mL via INTRAUTERINE

## 2012-04-23 MED ORDER — HEPARIN SODIUM (PORCINE) 5000 UNIT/ML IJ SOLN
5000.0000 [IU] | Freq: Three times a day (TID) | INTRAMUSCULAR | Status: DC
  Administered 2012-04-23 – 2012-04-24 (×2): 5000 [IU] via SUBCUTANEOUS
  Filled 2012-04-23 (×6): qty 1

## 2012-04-23 MED ORDER — ROCURONIUM BROMIDE 100 MG/10ML IV SOLN
INTRAVENOUS | Status: DC | PRN
  Administered 2012-04-23: 20 mg via INTRAVENOUS
  Administered 2012-04-23: 10 mg via INTRAVENOUS
  Administered 2012-04-23: 60 mg via INTRAVENOUS

## 2012-04-23 MED ORDER — HYDROCHLOROTHIAZIDE 25 MG PO TABS
25.0000 mg | ORAL_TABLET | Freq: Every morning | ORAL | Status: DC
  Administered 2012-04-24: 25 mg via ORAL
  Filled 2012-04-23: qty 1

## 2012-04-23 MED ORDER — MIDAZOLAM HCL 5 MG/5ML IJ SOLN
INTRAMUSCULAR | Status: DC | PRN
  Administered 2012-04-23: 2 mg via INTRAVENOUS

## 2012-04-23 MED ORDER — CYCLOSPORINE 0.05 % OP EMUL
1.0000 [drp] | Freq: Every morning | OPHTHALMIC | Status: DC
Start: 2012-04-24 — End: 2012-04-24
  Filled 2012-04-23: qty 1

## 2012-04-23 MED ORDER — BUPIVACAINE LIPOSOME 1.3 % IJ SUSP
20.0000 mL | Freq: Once | INTRAMUSCULAR | Status: AC
  Administered 2012-04-23: 20 mL
  Filled 2012-04-23: qty 20

## 2012-04-23 SURGICAL SUPPLY — 61 items
APL SKNCLS STERI-STRIP NONHPOA (GAUZE/BANDAGES/DRESSINGS)
BAND LAP 10.0 W/TUBES (Band) ×2 IMPLANT
BENZOIN TINCTURE PRP APPL 2/3 (GAUZE/BANDAGES/DRESSINGS) IMPLANT
BLADE HEX COATED 2.75 (ELECTRODE) ×2 IMPLANT
BLADE SURG 15 STRL LF DISP TIS (BLADE) ×1 IMPLANT
BLADE SURG 15 STRL SS (BLADE) ×2
CANISTER SUCTION 2500CC (MISCELLANEOUS) ×2 IMPLANT
CLOTH BEACON ORANGE TIMEOUT ST (SAFETY) ×2 IMPLANT
COVER SURGICAL LIGHT HANDLE (MISCELLANEOUS) ×2 IMPLANT
DECANTER SPIKE VIAL GLASS SM (MISCELLANEOUS) ×4 IMPLANT
DEVICE SUT QUICK LOAD TK 5 (STAPLE) ×8 IMPLANT
DEVICE SUT TI-KNOT TK 5X26 (MISCELLANEOUS) ×2 IMPLANT
DEVICE SUTURE ENDOST 10MM (ENDOMECHANICALS) IMPLANT
DISSECTOR BLUNT TIP ENDO 5MM (MISCELLANEOUS) IMPLANT
DRAPE CAMERA CLOSED 9X96 (DRAPES) ×4 IMPLANT
ELECT REM PT RETURN 9FT ADLT (ELECTROSURGICAL) ×2
ELECTRODE REM PT RTRN 9FT ADLT (ELECTROSURGICAL) ×1 IMPLANT
GLOVE BIOGEL M 8.0 STRL (GLOVE) ×2 IMPLANT
GLOVE BIOGEL PI IND STRL 7.0 (GLOVE) ×1 IMPLANT
GLOVE BIOGEL PI INDICATOR 7.0 (GLOVE) ×1
GOWN STRL NON-REIN LRG LVL3 (GOWN DISPOSABLE) ×4 IMPLANT
GOWN STRL REIN XL XLG (GOWN DISPOSABLE) ×4 IMPLANT
HOVERMATT SINGLE USE (MISCELLANEOUS) ×2 IMPLANT
KIT BASIN OR (CUSTOM PROCEDURE TRAY) ×2 IMPLANT
MESH HERNIA 1X4 RECT BARD (Mesh General) ×1 IMPLANT
MESH HERNIA BARD 1X4 (Mesh General) ×1 IMPLANT
NEEDLE SPNL 22GX3.5 QUINCKE BK (NEEDLE) ×2 IMPLANT
NS IRRIG 1000ML POUR BTL (IV SOLUTION) ×2 IMPLANT
PACK UNIVERSAL I (CUSTOM PROCEDURE TRAY) ×2 IMPLANT
PENCIL BUTTON HOLSTER BLD 10FT (ELECTRODE) ×2 IMPLANT
SCALPEL HARMONIC ACE (MISCELLANEOUS) IMPLANT
SET IRRIG TUBING LAPAROSCOPIC (IRRIGATION / IRRIGATOR) IMPLANT
SHEARS CURVED HARMONIC AC 45CM (MISCELLANEOUS) IMPLANT
SLEEVE ADV FIXATION 5X100MM (TROCAR) IMPLANT
SLEEVE Z-THREAD 5X100MM (TROCAR) IMPLANT
SOLUTION ANTI FOG 6CC (MISCELLANEOUS) ×2 IMPLANT
SPONGE GAUZE 4X4 12PLY (GAUZE/BANDAGES/DRESSINGS) ×2 IMPLANT
SPONGE LAP 18X18 X RAY DECT (DISPOSABLE) ×2 IMPLANT
STAPLER VISISTAT 35W (STAPLE) ×2 IMPLANT
STRIP CLOSURE SKIN 1/2X4 (GAUZE/BANDAGES/DRESSINGS) IMPLANT
SUT ETHIBOND 2 0 SH (SUTURE) ×3
SUT ETHIBOND 2 0 SH 36X2 (SUTURE) ×3 IMPLANT
SUT PROLENE 2 0 CT2 30 (SUTURE) ×2 IMPLANT
SUT SILK 0 (SUTURE)
SUT SILK 0 30XBRD TIE 6 (SUTURE) IMPLANT
SUT SURGIDAC NAB ES-9 0 48 120 (SUTURE) IMPLANT
SUT VIC AB 2-0 SH 27 (SUTURE)
SUT VIC AB 2-0 SH 27X BRD (SUTURE) IMPLANT
SUT VIC AB 4-0 SH 18 (SUTURE) ×2 IMPLANT
SYR 20CC LL (SYRINGE) ×2 IMPLANT
SYR 30ML LL (SYRINGE) ×2 IMPLANT
SYS KII OPTICAL ACCESS 15MM (TROCAR) ×2
SYSTEM KII OPTICAL ACCESS 15MM (TROCAR) ×1 IMPLANT
TOWEL OR 17X26 10 PK STRL BLUE (TOWEL DISPOSABLE) ×4 IMPLANT
TROCAR ADV FIXATION 11X100MM (TROCAR) IMPLANT
TROCAR XCEL NON-BLD 11X100MML (ENDOMECHANICALS) ×2 IMPLANT
TROCAR Z-THREAD FIOS 11X100 BL (TROCAR) IMPLANT
TROCAR Z-THREAD FIOS 5X100MM (TROCAR) ×2 IMPLANT
TROCAR Z-THREAD SLEEVE 11X100 (TROCAR) IMPLANT
TUBE CALIBRATION LAPBAND (TUBING) ×2 IMPLANT
TUBING INSUFFLATION 10FT LAP (TUBING) ×2 IMPLANT

## 2012-04-23 NOTE — Op Note (Signed)
04/23/2012  Surgeon: Wenda Low, MD, FACS Asst:  Lodema Pilot, DO  Procedure: Laparoscopic adjustable gastric banding with APL Lapband and 1 suture posterior closure of the hiatus hernia  Anes:  General  EBL:  Minimal  Description of Procedure  The patient was taken to OR # 1 and given general anesthesia.  After a prep with PCMX the patient was draped and a timeout performed.  Access to the abdomen was achieved with a 0 degree Optiview technique through the left upper quadrant.    Adhesions were minimal.  Ports were placed to the the right of the midline including a 15 trocar in  the right upper quadrant placed obliquely.  The Satira Mccallum was used to retract the left lateral segment and the peritoneum was incised along the left crus.   The EJ junction as assessed for a hiatus hernia and he had a positive balloon test with 10 cc.  The hiatus was dissected posteriorallly and approximated with a single Endostitch and ty knot.      The pars flaccida technique was utilized to insert the blunt "finger " dissector from right to left behind the stomach.  This created a target zone to pass the band passer.     The lapband APL  Had been previously flushed and was inserted through the 15 trocar.  It was placed in the tip of "the finger"  and pulled around behind the stomach.   The band was plicated with 3 sutures secured with Ty Knots.  A fig 8 antislip stitch was also placed.  The tubing was brought out through the lower incision on the right and connected to the port which had mesh sewn onto the back and was placed into the subcutaneous pocket.  The incisions were injected with Expariel and closed with 4-0 vicryl and Dermabond.     The patient was taken to the PACU in stable condition.    [name], MD, [facility], [place] [phone]

## 2012-04-23 NOTE — Transfer of Care (Signed)
Immediate Anesthesia Transfer of Care Note  Patient: Gilbert [name]  Procedure(s) Performed: Procedure(s) (LRB): LAPAROSCOPIC GASTRIC BANDING (N/A) HERNIA REPAIR HIATAL (N/A)  Patient Location: PACU  Anesthesia Type: General  Level of Consciousness: awake, alert , oriented and patient cooperative  Airway & Oxygen Therapy: Patient Spontanous Breathing and Patient connected to face mask oxygen  Post-op Assessment: Report given to PACU RN and Post -op Vital signs reviewed and stable  Post vital signs: Reviewed and stable  Complications: No apparent anesthesia complications

## 2012-04-23 NOTE — Progress Notes (Signed)
Teachback completed and pt verbalized understanding

## 2012-04-23 NOTE — H&P (Addendum)
hief Complaint: Obesity wt 299, 6'3"  History of Present Illness: Gilbert [name] is an 41 y.o. male has been evaluated for lapband surgery. UGI showed no hiatus hernia but he does have GER unless he takes Prilosec. He is admitted today for Lapband surgery  Past Medical History   Diagnosis  Date   .  Hypertension    .  Chronic back pain    .  Ocular rosacea    .  GERD (gastroesophageal reflux disease)    .  Morbid obesity    .  Hepatitis      unknown type many yrs ago - no residual problems   .  Sleep apnea      STOP BANG SCORE 6    Past Surgical History   Procedure  Date   .  Knee surgery      right   .  Lasik     Current Outpatient Prescriptions   Medication  Sig  Dispense  Refill   .  clindamycin (CLEOCIN T) 1 % lotion  Apply topically every other day.     .  cycloSPORINE (RESTASIS) 0.05 % ophthalmic emulsion  Place 1 drop into both eyes every morning.     Marland Kitchen  DOXYCYCLINE HYCLATE PO  Take 50 mg by mouth every morning.     .  enalapril (VASOTEC) 20 MG tablet  Take 40 mg by mouth every morning.     .  hydrochlorothiazide (HYDRODIURIL) 25 MG tablet  Take 25 mg by mouth every morning.     Marland Kitchen  ibuprofen (ADVIL,MOTRIN) 200 MG tablet  Take 400 mg by mouth every 6 (six) hours as needed. For back pain     .  omeprazole (PRILOSEC) 20 MG capsule  Take 20 mg by mouth every morning.      Penicillins  Family History   Problem  Relation  Age of Onset   .  Hypertension  Father    .  Diabetes  Father    .  Heart disease  Father       pacemaker    Social History: reports that he has never smoked. He has never used smokeless tobacco. He reports that he drinks alcohol. He reports that he does not use illicit drugs.  REVIEW OF SYSTEMS - PERTINENT POSITIVES ONLY:  negative  Physical Exam:  Blood pressure 144/78, pulse 98, temperature 97.7 F (36.5 C), temperature source Temporal, resp. rate 18, height 6\' 3"  (1.905 m), weight 299 lb (135.626 kg).  Body mass index is 37.37 kg/(m^2).  Gen: WDWN  WM NAD  Neurological: Alert and oriented to person, place, and time. Motor and sensory function is grossly intact  Head: Normocephalic and atraumatic.  Eyes: Conjunctivae are normal. Pupils are equal, round, and reactive to light. No scleral icterus.  Neck: Normal range of motion. Neck supple. No tracheal deviation or thyromegaly present.  Cardiovascular: SR without murmurs or gallops. No carotid bruits  Respiratory: Effort normal. No respiratory distress. No chest wall tenderness. Breath sounds normal. No wheezes, rales or rhonchi.  Abdomen: nontender  GU:  Musculoskeletal: Normal range of motion. Extremities are nontender. No cyanosis, edema or clubbing noted Lymphadenopathy: No cervical, preauricular, postauricular or axillary adenopathy is present Skin: Skin is warm and dry. No rash noted. No diaphoresis. No erythema. No pallor. Pscyh: Normal mood and affect. Behavior is normal. Judgment and thought content normal.  LABORATORY RESULTS:  No results found for this or any previous visit (from the past 48 hour(s)).  RADIOLOGY  RESULTS:  No results found.  Problem [name]:  [name]   Diagnosis   .  OBESITY   .  HYPERTENSION   .  EXTERNAL HEMORRHOIDS   .  SINUSITIS- ACUTE-NOS   .  URI   .  GERD   .  GASTROENTERITIS   .  BACK PAIN, CHRONIC   .  ACHILLES TENDINITIS   .  CHEST PAIN   .  CHEST WALL PAIN, ANTERIOR   .  Insomnia   .  General medical examination    Assessment & Plan:  Obesity. For lapband. No evidence of hiatus hernia  [name], MD, [facility]  [phone] beeper  [phone] There has been no change in the patient's past medical history or physical exam in the past 24 hours to the best of my knowledge. I examined the patient in the holding area and have made any changes to the history and physical exam report that is included above.   Expectations and outcome results have been discussed with the patient to include risks  and benefits.  All questions have been answered and we will proceed with previously discussed procedure noted and signed in the consent form in the patient's record.    Roxine Whittinghill BMD FACS 12:47 PM  04/23/2012

## 2012-04-23 NOTE — Anesthesia Preprocedure Evaluation (Addendum)
Anesthesia Evaluation  Patient identified by MRN, date of birth, ID band Patient awake    Reviewed: Allergy & Precautions, H&P , NPO status , Patient's Chart, lab work & pertinent test results  Airway Mallampati: III TM Distance: >3 FB Neck ROM: full    Dental No notable dental hx. (+) Teeth Intact and Dental Advisory Given   Pulmonary sleep apnea ,  Stop bang 6 breath sounds clear to auscultation  Pulmonary exam normal       Cardiovascular Exercise Tolerance: Good hypertension, Pt. on medications Rhythm:regular Rate:Normal     Neuro/Psych negative neurological ROS  negative psych ROS   GI/Hepatic negative GI ROS, Neg liver ROS, GERD-  Medicated and Controlled,(+) Hepatitis -  Endo/Other  negative endocrine ROSMorbid obesity  Renal/GU negative Renal ROS  negative genitourinary   Musculoskeletal   Abdominal   Peds  Hematology negative hematology ROS (+)   Anesthesia Other Findings   Reproductive/Obstetrics negative OB ROS                         Anesthesia Physical Anesthesia Plan  ASA: III  Anesthesia Plan: General   Post-op Pain Management:    Induction: Intravenous  Airway Management Planned: Oral ETT  Additional Equipment:   Intra-op Plan:   Post-operative Plan: Extubation in OR  Informed Consent: I have reviewed the patients History and Physical, chart, labs and discussed the procedure including the risks, benefits and alternatives for the proposed anesthesia with the patient or authorized representative who has indicated his/her understanding and acceptance.   Dental Advisory Given  Plan Discussed with: CRNA and Surgeon  Anesthesia Plan Comments:         Anesthesia Quick Evaluation

## 2012-04-23 NOTE — Anesthesia Postprocedure Evaluation (Signed)
  Anesthesia Post-op Note  Patient: Gilbert [name]  Procedure(s) Performed: Procedure(s) (LRB): LAPAROSCOPIC GASTRIC BANDING (N/A) HERNIA REPAIR HIATAL (N/A)  Patient Location: PACU  Anesthesia Type: General  Level of Consciousness: awake and alert   Airway and Oxygen Therapy: Patient Spontanous Breathing  Post-op Pain: mild  Post-op Assessment: Post-op Vital signs reviewed, Patient's Cardiovascular Status Stable, Respiratory Function Stable, Patent Airway and No signs of Nausea or vomiting  Post-op Vital Signs: stable  Complications: No apparent anesthesia complications

## 2012-04-23 NOTE — Telephone Encounter (Signed)
Notified OR that Dr. Daphine Deutscher did not order a replacement antibiotic for cefoxitin.  Pharmacy can f/u with him as needed pre-op.  Spoke with Marylu Lund.

## 2012-04-24 ENCOUNTER — Inpatient Hospital Stay (HOSPITAL_COMMUNITY): Payer: BC Managed Care – PPO

## 2012-04-24 ENCOUNTER — Telehealth (INDEPENDENT_AMBULATORY_CARE_PROVIDER_SITE_OTHER): Payer: Self-pay

## 2012-04-24 ENCOUNTER — Ambulatory Visit (INDEPENDENT_AMBULATORY_CARE_PROVIDER_SITE_OTHER): Payer: BC Managed Care – PPO | Admitting: Surgery

## 2012-04-24 ENCOUNTER — Encounter (HOSPITAL_COMMUNITY): Payer: Self-pay | Admitting: Surgery

## 2012-04-24 DIAGNOSIS — Z9884 Bariatric surgery status: Secondary | ICD-10-CM

## 2012-04-24 HISTORY — DX: Bariatric surgery status: Z98.84

## 2012-04-24 LAB — CBC
Hemoglobin: 13 g/dL (ref 13.0–17.0)
MCHC: 35.1 g/dL (ref 30.0–36.0)
Platelets: 265 10*3/uL (ref 150–400)
RDW: 11.8 % (ref 11.5–15.5)

## 2012-04-24 LAB — DIFFERENTIAL
Basophils Absolute: 0 10*3/uL (ref 0.0–0.1)
Basophils Relative: 0 % (ref 0–1)
Eosinophils Absolute: 0 10*3/uL (ref 0.0–0.7)
Neutro Abs: 6.2 10*3/uL (ref 1.7–7.7)
Neutrophils Relative %: 77 % (ref 43–77)

## 2012-04-24 MED ORDER — OXYCODONE-ACETAMINOPHEN 5-325 MG/5ML PO SOLN
5.0000 mL | ORAL | Status: DC | PRN
Start: 2012-04-24 — End: 2012-06-13

## 2012-04-24 MED ORDER — IOHEXOL 300 MG/ML  SOLN: 20.0000 mL | Freq: Once | INTRAMUSCULAR | Status: DC | PRN

## 2012-04-24 NOTE — Discharge Summary (Signed)
Physician Discharge Summary  Patient ID: Gilbert [name] MRN: [id number] DOB/AGE: 11/04/1970 41 y.o.  Admit date: 04/23/2012 Discharge date: 04/24/2012  Admission Diagnoses:  Obesity and GERD  Discharge Diagnoses:  same  Active Problems:  Lapband APL + [facility] repair July 2013   Surgery:  lapband and hiatus hernia repair  Discharged Condition: improved  Hospital Course:   Had lapband surgery and posterior repair hiatus hernia.  PD 1 had swallow which looked OK  Read for discharge  Consults: none  Significant Diagnostic Studies: UG    Discharge Exam: Blood pressure 128/81, pulse 60, temperature 97.9 F (36.6 C), temperature source Oral, resp. rate 18, height 6\' 3"  (1.905 m), weight 294 lb 8.5 oz (133.598 kg), SpO2 96.00%. Incisions look good.    Disposition: Final discharge disposition not confirmed  Discharge Orders    Future Appointments: Provider: Department: Dept Phone: Center:   05/07/2012 4:00 PM [facility] [phone] NDM   05/16/2012 9:00 AM Valarie Merino, MD [facility] [place] [phone] None   05/20/2012 9:00 AM Sheliah Hatch, MD [facility] [phone] [facility]     Future Orders Please Complete By Expires   Diet Carb Modified      Increase activity slowly      Discharge instructions      Comments:   May shower Follow bariatric diet     Medication List  As of 04/24/2012  9:37 AM   STOP taking these medications         omeprazole 20 MG capsule         TAKE these medications         clindamycin 1 % lotion   Commonly known as: CLEOCIN T   Apply topically every other day.      cyanocobalamin 500 MCG tablet   Place 500 mcg under the tongue daily.      cycloSPORINE 0.05 % ophthalmic emulsion   Commonly known as: RESTASIS   Place 1 drop into both eyes every morning.      DOXYCYCLINE HYCLATE PO   Take 50 mg by mouth every morning.      enalapril 20 MG tablet   Commonly known as: VASOTEC   Take 40 mg by mouth  every morning.      hydrochlorothiazide 25 MG tablet   Commonly known as: HYDRODIURIL   Take 25 mg by mouth every morning.      ibuprofen 200 MG tablet   Commonly known as: ADVIL,MOTRIN   Take 400 mg by mouth every 6 (six) hours as needed. For back pain      oxyCODONE-acetaminophen 5-325 MG/5ML solution   Commonly known as: ROXICET   Take 5-10 mLs by mouth every 4 (four) hours as needed for pain.           Follow-up Information    Call Valarie Merino, MD.   Contact information:   [facility] [address] [place] [address] [phone]          Signed: Valarie Merino 04/24/2012, 9:37 AM

## 2012-04-24 NOTE — Progress Notes (Signed)
Patient alert and oriented. VSS. Tolerating protein drink. Ambulating well. Pain controlled. UGI swallow study normal, band at 2 and 8 o'clock position. Reviewed discharge instructions and copy provided to patient. Patient has follow-up appointments in place with Dr. Daphine Deutscher and the nutritionist.   ADJUSTABLE GASTRIC BAND DISCHARGE INSTRUCTIONS  Drs. Fredrik Rigger, [place], Chandlar Guice, and [place]  Call if you have any problems.   Call [phone] and ask for the surgeon on call.    If you need immediate assistance come to the ER at [facility]. Tell the ER personnel that you are a new post-op gastric banding patient. Signs and symptoms to report:   Severe vomiting or nausea. If you cannot tolerate clear liquids for longer than 1 day, you need to call your surgeon.    Abdominal pain which does not get better after taking your pain medication   Fever greater than 101 F degree   Difficulty breathing   Chest pain    Redness, swelling, drainage, or foul odor at incision sites    If your incisions open or pull apart   Swelling or pain in calf (lower leg)   Diarrhea, frequent watery, uncontrolled bowel movements.   Constipation, (no bowel movements for 3 days) if this occurs, Take Milk of Magnesia, 2 tablespoons by mouth, 3 times a day for 2 days if needed.  Call your doctor if constipation continues. Stop taking Milk of Magnesia once you have had a bowel movement. You may also use Miralax according to the label instructions.   Anything you consider "abnormal for you".   Normal side effects after Surgery:   Unable to sleep at night or concentrate   Irritability   Being tearful (crying) or depressed   These are common complaints, possibly related to your anesthesia, stress of surgery and change in lifestyle, that usually go away a few weeks after surgery.  If these feelings continue, call your medical doctor.  Wound Care You may have surgical glue, steri-strips, or staples over your incisions  after surgery.  Surgical glue:  Looks like a clear film over your incisions and will wear off gradually. Steri-strips: Strips of tape over your incisions. You may notice a yellowish color on the skin underneath the steri-strips. This is a substance used to make the steri-strips stick better. Do not pull the steri-strips off - let them fall off. Staples: Cherlynn Polo may be removed before you leave the hospital. If you go home with staples, call [facility] [place] [facility] [phone]) for an appointment with your surgeon's nurse to have staples removed in 7 - 10 days. Showering: You may shower two days after your surgery unless otherwise instructed by your surgeon. Wash gently around wounds with warm soapy water, rinse well, and gently pat dry.  If you have a drain, you may need someone to hold this while you shower. Avoid tub baths until staples are removed and incisions are healed.    Medications   Medications should be liquid or crushed if larger than the size of a dime.  Extended release pills should not be crushed.   Depending on the size and number of medications you take, you may need to stagger/change the time you take your medications so that you do not over-fill your pouch.    Make sure you follow-up with your primary care physician to make medication adjustments needed during rapid weight loss and life-style adjustment.   If you are diabetic, follow up with the doctor that prescribes your diabetes medication(s) within one week  after surgery and check your blood sugar regularly.   Do not drive while taking narcotics!   Do not take acetaminophen (Tylenol) and Roxicet or Lortab Elixir at the same time since these pain medications contain acetaminophen.  Diet at home: (First 2 Weeks)  You will see the nutritionist two weeks after your surgery. She will advance your diet if you are tolerating liquids well. Once at home, if you have severe vomiting or nausea and cannot tolerate clear liquids lasting  longer than 1 day, call your surgeon.  For Same Day Surgery Discharge Patients: The day of surgery drink water only: 2 ounces every 4 hours. If you are tolerating water, begin drinking your high protein shake the next morning. For Overnight Stay Patients: Begin high protein shake 2 ounces every 3 hours, 5 - 6 times per day.  Gradually increase the amount you drink as tolerated.  You may find it easier to slowly sip shakes throughout the day.  It is important to get your proteins in first.   Protein Shake   Drink at least 2 ounces of shake 5-6 times per day   Each serving of protein shakes should have a minimum of 15 grams of protein and no more than 5 grams of carbohydrate    Increase the amount of protein shake you drink as tolerated   Protein powder may be added to fluids such as non-fat milk or Lactaid milk (limit to 20 grams added protein powder per serving   The initial goal is to drink at least 8 ounces of protein shake/drink per day (or as directed by the nutritionist). Some examples of protein shakes are [employer], [employer], EAS Edge HP, and Unjury. Hydration   Gradually increase the amount of water and other liquids as tolerated (See Acceptable Fluids)   Gradually increase the amount of protein shake as tolerated     Sip fluids slowly and throughout the day   May use Sugar substitutes, use sparingly (limit to 6 - 8 packets per day).  Your fluid goal is 64 ounces of fluid daily. It may take a few weeks to build up to this.         32 oz (or more) should be clear liquids and 32 oz (or more) should be full liquids.         Liquids should not contain sugar, caffeine, or carbonation!  Acceptable Fluids Clear Liquids:   Water or Sugar-free flavored water, Fruit H2O   Decaffeinated coffee or tea (sugar-free)   Crystal Lite, Wyler's Lite, Minute Maid Lite   Sugar-free Jell-O   Bouillon or broth   Sugar-free Popsicle:   *Less than 20 calories each; Limit 1 per day   Full  Liquids:              Protein Shakes/Drinks + 2 choices per day of other full liquids shown below.    Other full liquids must be: No more than 12 grams of Carbs per serving,  No more than 3 grams of Fat per serving   Strained low-fat cream soup   Non-Fat milk   Fat-free Lactaid Milk   Sugar-free yogurt (Dannon Lite & Fit) Vitamins and Minerals (Start 1 day after surgery unless otherwise directed)   1 Chewable Multivitamin / Multimineral Supplement (i.e. Centrum for Adults)   Chewable Calcium Citrate with Vitamin D-3. Take 1500 mg each day.           (Example: 3 Chewable Calcium Plus 600 with Vitamin D-3 can  be found at [facility])           Do not mix multivitamins containing iron with calcium supplements; take 2 hours   apart   Do not substitute Tums (calcium carbonate) for your calcium   Menstruating women and those at risk for anemia may need extra iron. Talk with your doctor to see if you need additional iron.     If you need extra iron:  Total daily Iron recommendations (including Vitamins) = 50 - 100 mg Iron/day Do not stop taking or change any vitamins or minerals until you talk to your nutritionist or surgeon. Your nutritionist and / or physician must approve all vitamin and mineral supplements. Exercise For maximum success, begin exercising as soon as your doctor recommends. Make sure your physician approves any physical activity.   Depending on fitness level, begin with a simple walking program   Walk 5-15 minutes each day, 7 days per week.    Slowly increase until you are walking 30-45 minutes per day   Consider joining our BELT program. [phone] or email belt@[employer] .edu Things to remember:   You may have sexual relations when you feel comfortable. It is VERY important for male patients to use a reliable birth control method. Fertility often increases after surgery. Do not get pregnant for at least 18 months.   It is very important to keep all follow up appointments with your  surgeon, nutritionist, primary care physician, and behavioral health practitioner. After the first year, please follow up with your bariatric surgeon at least once a year in order to maintain best weight loss results.  [facility] [place] [facility]: [phone] [facility]: [phone]   Free counseling is available for you and your family through collaboration between [facility] and [place]. Please call [phone] and leave a message.    Consider purchasing a medical alert bracelet that says you had lap-band surgery.    The [facility] has a free Bariatric Surgery Support Group that meets monthly, the 3rd Thursday, 6 pm, Classroom #1, [place]. You may register online at [email or web address], but registration is not necessary. Select Classes and Support Groups, Bariatric Surgery, or Call [phone]   Do not return to work or drive until cleared by your surgeon   Use your CPAP when sleeping if applicable   Do not lift anything greater than ten pounds for at least two weeks.   You will probably have your first fill (fluid added to your band) 6 weeks after surgery

## 2012-04-24 NOTE — Progress Notes (Signed)
Pt discharged home via family; Pt and family given and explained all discharge instructions, carenotes, and prescriptions; pt and family stated understanding and denied questions/concerns; all f/u appointments in place; IV removed without complicaitons; pt stable at time of discharge  Pt given and explained all bariatric discharge instructions; stated understanding and denied questions/concners

## 2012-04-24 NOTE — Telephone Encounter (Signed)
The patient's wife called and states he is complaining about gas pain.  He has no nausea, vomiting or fever.  She asked if she could give him Gas-X chewable.  I told her that is fine or he can try liquid gas medicine such as Mylanta.

## 2012-05-02 ENCOUNTER — Telehealth (INDEPENDENT_AMBULATORY_CARE_PROVIDER_SITE_OTHER): Payer: Self-pay

## 2012-05-02 ENCOUNTER — Other Ambulatory Visit (INDEPENDENT_AMBULATORY_CARE_PROVIDER_SITE_OTHER): Payer: Self-pay

## 2012-05-02 ENCOUNTER — Ambulatory Visit
Admission: RE | Admit: 2012-05-02 | Discharge: 2012-05-02 | Disposition: A | Discharge status: Home / Self Care | Payer: BC Managed Care – PPO | Source: Ambulatory Visit | Attending: General Surgery | Admitting: General Surgery

## 2012-05-02 ENCOUNTER — Other Ambulatory Visit (INDEPENDENT_AMBULATORY_CARE_PROVIDER_SITE_OTHER): Payer: Self-pay | Admitting: General Surgery

## 2012-05-02 DIAGNOSIS — M25519 Pain in unspecified shoulder: Secondary | ICD-10-CM

## 2012-05-02 DIAGNOSIS — G8918 Other acute postprocedural pain: Secondary | ICD-10-CM

## 2012-05-02 LAB — CBC WITH DIFFERENTIAL/PLATELET
Basophils Relative: 1 % (ref 0–1)
Hemoglobin: 13.7 g/dL (ref 13.0–17.0)
Lymphs Abs: 1.8 10*3/uL (ref 0.7–4.0)
Monocytes Relative: 9 % (ref 3–12)
Neutro Abs: 5.2 10*3/uL (ref 1.7–7.7)
Neutrophils Relative %: 64 % (ref 43–77)
Platelets: 355 10*3/uL (ref 150–400)
RBC: 4.22 MIL/uL (ref 4.22–5.81)

## 2012-05-02 NOTE — Telephone Encounter (Signed)
Patient called in stating he has worsening left shoulder pain.  He is a little over a week s/p lap band placement.  He called last night and spoke to one of our doctor's on call who said it was too far out for gas pain.  The pt said it was fine this am but bad this pm.  He has no nausea, no vomiting, no chest pain and no shortness of breath.  No hx of heart problems.  I spoke to Dr Biagio Quint in the office because he assisted on his surgery.  He ordered a CXR and CBC and to have him be seen.  I set the CXR up for [facility] today by 4:30.  The pt can walk in.  The blood will be drawn at [facility].  I made an appointment for tomorrow's urgent clinic.  I gave Dr Johna Sheriff a heads up because he is on call tonight and may get a call on the results.

## 2012-05-03 ENCOUNTER — Ambulatory Visit (INDEPENDENT_AMBULATORY_CARE_PROVIDER_SITE_OTHER): Payer: BC Managed Care – PPO | Admitting: General Surgery

## 2012-05-03 ENCOUNTER — Encounter (INDEPENDENT_AMBULATORY_CARE_PROVIDER_SITE_OTHER): Payer: Self-pay | Admitting: General Surgery

## 2012-05-03 ENCOUNTER — Encounter (INDEPENDENT_AMBULATORY_CARE_PROVIDER_SITE_OTHER): Payer: Self-pay

## 2012-05-03 VITALS — BP 126/88 | HR 86 | Temp 97.0°F | Resp 20 | Ht 75.0 in | Wt 286.8 lb

## 2012-05-03 DIAGNOSIS — Z9884 Bariatric surgery status: Secondary | ICD-10-CM

## 2012-05-03 NOTE — Patient Instructions (Signed)
Call Monday if shoulder pain returns

## 2012-05-07 ENCOUNTER — Ambulatory Visit: Payer: BC Managed Care – PPO

## 2012-05-08 ENCOUNTER — Encounter (INDEPENDENT_AMBULATORY_CARE_PROVIDER_SITE_OTHER): Payer: Self-pay | Admitting: Surgery

## 2012-05-08 ENCOUNTER — Ambulatory Visit (INDEPENDENT_AMBULATORY_CARE_PROVIDER_SITE_OTHER): Payer: BC Managed Care – PPO | Admitting: Surgery

## 2012-05-08 VITALS — BP 112/72 | HR 68 | Temp 96.8°F | Resp 16 | Ht 75.0 in | Wt 282.6 lb

## 2012-05-08 DIAGNOSIS — Z9884 Bariatric surgery status: Secondary | ICD-10-CM

## 2012-05-08 NOTE — Progress Notes (Signed)
Gilbert [name] 41 y.o.  Body mass index is 35.32 kg/(m^2).  Patient Active Problem List  Diagnosis  . OBESITY  . HYPERTENSION  . EXTERNAL HEMORRHOIDS  . SINUSITIS- ACUTE-NOS  . URI  . GERD  . GASTROENTERITIS  . BACK PAIN, CHRONIC  . ACHILLES TENDINITIS  . CHEST PAIN  . CHEST WALL PAIN, ANTERIOR  . Insomnia  . General medical examination  . Lapband APL + HH repair July 2013    Allergies  Allergen Reactions  . Penicillins Rash    Past Surgical History  Procedure Date  . Knee surgery     right  . Lasik   . Laparoscopic gastric banding 04/23/2012    Procedure: LAPAROSCOPIC GASTRIC BANDING;  Surgeon: Valarie Merino, MD;  Location: [facility];  Service: General;  Laterality: N/A;  . Hiatal hernia repair 04/23/2012    Procedure: HERNIA REPAIR HIATAL;  Surgeon: Valarie Merino, MD;  Location: [facility]S;  Service: General;  Laterality: N/A;   Neena Rhymes, MD 1. Lapband APL with 1 suture closure of hiatus July 2013     CXR and lab were OK from visit with Dr. Carolynne Edouard at urgent office last week.  Pain is left scapular and intermittent.  Thus far he has lost 16 lbs since surgery.   Will see back in September after wedding in [place] for first band fill.  He is seeing Huntley Dec this week for dietary suggestions. [name], MD, [facility] [phone] beeper [phone]  05/08/2012 4:57 PM

## 2012-05-08 NOTE — Patient Instructions (Addendum)
Take Advil for back pain.  Try to note association with foods or position.

## 2012-05-09 ENCOUNTER — Ambulatory Visit (INDEPENDENT_AMBULATORY_CARE_PROVIDER_SITE_OTHER): Payer: BC Managed Care – PPO | Admitting: Surgery

## 2012-05-10 ENCOUNTER — Encounter: Payer: BC Managed Care – PPO | Attending: General Surgery | Admitting: *Deleted

## 2012-05-10 ENCOUNTER — Encounter: Payer: Self-pay | Admitting: *Deleted

## 2012-05-10 VITALS — Ht 75.0 in | Wt 282.0 lb

## 2012-05-10 DIAGNOSIS — Z713 Dietary counseling and surveillance: Secondary | ICD-10-CM | POA: Insufficient documentation

## 2012-05-10 DIAGNOSIS — Z01818 Encounter for other preprocedural examination: Secondary | ICD-10-CM | POA: Insufficient documentation

## 2012-05-10 DIAGNOSIS — E669 Obesity, unspecified: Secondary | ICD-10-CM | POA: Insufficient documentation

## 2012-05-10 NOTE — Progress Notes (Signed)
Bariatric Follow-up:  Appt start time: 0900  End time:  1000.  2 Week Post-Operative Nutrition Education Visit  Patient was seen on 05/10/2012 for Post-Operative Nutrition education at [facility].   Surgery date: 04/23/12 Surgery type: LAGB Start weight at [facility]: 318.5 lbs (01/31/12)  Weight today: 282.0 lbs Weight change: 36.5 lbs Total weight lost: 36.5 lbs BMI: 35.2 kg/m^2  TANITA  BODY COMP RESULTS  01/31/12 05/10/12  %Fat 46.9% 47.6%  FM (lbs) 147.0 134.0  FFM (lbs) 166.5 148.0  TBW (lbs) 122.0 108.5   The following the learning objective met the patient during this course:  Identifies Phase 3A (Soft, High Proteins) Dietary Goals and will begin from 2 weeks post-operatively to 2 months post-operatively  Identifies appropriate sources of fluids and proteins   States protein recommendations and appropriate sources post-operatively  Identifies the need for appropriate texture modifications, mastication, and bite sizes when consuming solids  Identifies appropriate multivitamin and calcium sources post-operatively  Describes the need for physical activity post-operatively and will follow MD recommendations  States when to call healthcare provider regarding medication questions or post-operative complications  Handouts given during class include:  Phase 3A: Soft, High Protein Diet Handout  Band Fill Guidelines Handout  Follow-Up Plan: Patient will follow-up at [facility] in 6 weeks for 2 months post-op nutrition visit for diet advancement per MD.

## 2012-05-10 NOTE — Patient Instructions (Signed)
Patient to follow Phase 3A-Soft, High Protein Diet and follow-up at [facility] in 6 weeks for 2 months post-op nutrition visit for diet advancement. 

## 2012-05-16 ENCOUNTER — Encounter (INDEPENDENT_AMBULATORY_CARE_PROVIDER_SITE_OTHER): Payer: BC Managed Care – PPO | Admitting: Surgery

## 2012-05-20 ENCOUNTER — Encounter: Payer: Self-pay | Admitting: *Deleted

## 2012-05-20 ENCOUNTER — Ambulatory Visit (INDEPENDENT_AMBULATORY_CARE_PROVIDER_SITE_OTHER): Payer: BC Managed Care – PPO | Admitting: Family Medicine

## 2012-05-20 ENCOUNTER — Encounter: Payer: Self-pay | Admitting: Family Medicine

## 2012-05-20 VITALS — BP 135/75 | HR 81 | Temp 98.5°F | Ht 73.5 in | Wt 279.6 lb

## 2012-05-20 DIAGNOSIS — E669 Obesity, unspecified: Secondary | ICD-10-CM

## 2012-05-20 DIAGNOSIS — I10 Essential (primary) hypertension: Secondary | ICD-10-CM

## 2012-05-20 LAB — BASIC METABOLIC PANEL
CO2: 27 mEq/L (ref 19–32)
Calcium: 9.5 mg/dL (ref 8.4–10.5)
GFR: 112.75 mL/min (ref >60.00)
Glucose, Bld: 74 mg/dL (ref 70–99)
Potassium: 3.9 mEq/L (ref 3.5–5.1)
Sodium: 138 mEq/L (ref 135–145)

## 2012-05-20 NOTE — Assessment & Plan Note (Signed)
Improving s/p lap band.  Applauded efforts.  Will continue to follow.

## 2012-05-20 NOTE — Progress Notes (Signed)
  Subjective:    Patient ID: Gilbert [name], male    DOB: 1971-01-20, 41 y.o.   MRN: [id number]  HPI HTN- chronic problem, on enalapril and HCTZ.  Just came from working out so BP is mildly elevated.  Denies CP, SOB, HAs, visual changes, edema.  Obesity- s/p lap band on 7/30.  Has lost 30 lbs.  Feeling well, very pleased w/ his weight loss.   Review of Systems For ROS see HPI     Objective:   Physical Exam  Vitals reviewed. Constitutional: He is oriented to person, place, and time. He appears well-developed and well-nourished. No distress.  HENT:  Head: Normocephalic and atraumatic.  Eyes: Conjunctivae and EOM are normal. Pupils are equal, round, and reactive to light.  Neck: Normal range of motion. Neck supple. No thyromegaly present.  Cardiovascular: Normal rate, regular rhythm, normal heart sounds and intact distal pulses.   No murmur heard. Pulmonary/Chest: Effort normal and breath sounds normal. No respiratory distress.  Abdominal: Soft. Bowel sounds are normal. He exhibits no distension.  Musculoskeletal: He exhibits no edema.  Lymphadenopathy:    He has no cervical adenopathy.  Neurological: He is alert and oriented to person, place, and time. No cranial nerve deficit.  Skin: Skin is warm and dry.  Psychiatric: He has a normal mood and affect. His behavior is normal.          Assessment & Plan:

## 2012-05-20 NOTE — Patient Instructions (Addendum)
Schedule your complete physical in 6 months You look great!  Keep up the good work! Continue the BP meds for now- we may have to reduce or stop these as you continue to lose weight Call with any questions or concerns Happy Labor Day!!!

## 2012-05-20 NOTE — Assessment & Plan Note (Signed)
Chronic problem, fair control.  Asymptomatic.  Is losing considerable amounts of weight.  Will continue to follow and adjust BP meds as needed.  Pt expressed understanding and is in agreement w/ plan.

## 2012-06-04 NOTE — Progress Notes (Signed)
Subjective:     Patient ID: Gilbert [name], male   DOB: 10-08-1970, 41 y.o.   MRN: [id number]  HPI The patient is a 41 year old white male who is status post recent placement of a lap band. He initially seemed to be improving but then this past Wednesday began having left shoulder pain. The pain is similar to the gas pains he experienced right after surgery. He denies any fevers or chills. He denies any nausea or vomiting.  Review of Systems     Objective:   Physical Exam On exam his abdomen is soft and nontender. His incisions are healing nicely with no sign of infection. He has no tachycardia or fever.    Assessment:     Status post lap band with some recurrent left shoulder pain    Plan:     At this point he does not appear to be sick. I've discussed the case with our bariatric surgeons who also believes he is okay. We will plan to have him follow Dr. Daphine Deutscher in the near future. Otherwise he will stay on his liquid diet.

## 2012-06-12 ENCOUNTER — Encounter (INDEPENDENT_AMBULATORY_CARE_PROVIDER_SITE_OTHER): Payer: BC Managed Care – PPO | Admitting: Surgery

## 2012-06-13 ENCOUNTER — Encounter (INDEPENDENT_AMBULATORY_CARE_PROVIDER_SITE_OTHER): Payer: Self-pay

## 2012-06-13 ENCOUNTER — Ambulatory Visit (INDEPENDENT_AMBULATORY_CARE_PROVIDER_SITE_OTHER): Payer: BC Managed Care – PPO | Admitting: Physician Assistant

## 2012-06-13 VITALS — BP 126/76 | HR 60 | Temp 97.2°F | Resp 16 | Ht 75.0 in | Wt 275.2 lb

## 2012-06-13 DIAGNOSIS — Z4651 Encounter for fitting and adjustment of gastric lap band: Secondary | ICD-10-CM

## 2012-06-13 NOTE — Progress Notes (Signed)
  HISTORY: Gilbert [name] is a 41 y.o.male who received an AP-Large lap-band in July 2013 by Dr. Daphine Deutscher. He comes in with increasing hunger and portion sizes but no regurgitation or reflux. This will be his first fill today.  VITAL SIGNS: Filed Vitals:   06/13/12 1543  BP: 126/76  Pulse: 60  Temp: 97.2 F (36.2 C)  Resp: 16    PHYSICAL EXAM: Physical exam reveals a very well-appearing 40 y.o.male in no apparent distress Neurologic: Awake, alert, oriented Psych: Bright affect, conversant Respiratory: Breathing even and unlabored. No stridor or wheezing Abdomen: Soft, nontender, nondistended to palpation. Incisions well-healed. No incisional hernias. Port easily palpated. Extremities: Atraumatic, good range of motion.  ASSESMENT: 41 y.o.  male  s/p AP-Large lap-band.   PLAN: The patient's port was accessed with a 20G Huber needle without difficulty. Clear fluid was aspirated and 2 mL saline was added to the port. The patient was able to swallow water without difficulty following the procedure and was instructed to take clear liquids for the next 24-48 hours and advance slowly as tolerated.

## 2012-06-13 NOTE — Patient Instructions (Signed)
Take clear liquids tonight. Thin protein shakes are ok to start tomorrow morning. Slowly advance your diet thereafter. Call us if you have persistent vomiting or regurgitation, night cough or reflux symptoms. Return as scheduled or sooner if you notice no changes in hunger/portion sizes.  

## 2012-06-21 ENCOUNTER — Ambulatory Visit: Payer: BC Managed Care – PPO | Admitting: *Deleted

## 2012-07-01 ENCOUNTER — Encounter: Payer: BC Managed Care – PPO | Attending: General Surgery | Admitting: *Deleted

## 2012-07-01 ENCOUNTER — Encounter: Payer: Self-pay | Admitting: *Deleted

## 2012-07-01 VITALS — Ht 75.0 in | Wt 266.5 lb

## 2012-07-01 DIAGNOSIS — E669 Obesity, unspecified: Secondary | ICD-10-CM

## 2012-07-01 DIAGNOSIS — Z01818 Encounter for other preprocedural examination: Secondary | ICD-10-CM | POA: Insufficient documentation

## 2012-07-01 DIAGNOSIS — Z713 Dietary counseling and surveillance: Secondary | ICD-10-CM | POA: Insufficient documentation

## 2012-07-01 NOTE — Patient Instructions (Addendum)
Goals:  Follow Phase 3B: High Protein + Non-Starchy Vegetables  Eat 3-6 small meals/snacks, every 3-5 hrs  Increase lean protein foods to meet 80g goal  Increase fluid intake to 64oz +  Avoid drinking 15 minutes before, during and 30 minutes after eating  Aim for >30 min of physical activity daily 

## 2012-07-01 NOTE — Progress Notes (Signed)
Follow-up visit:  8 Weeks Post-Operative LAGB Surgery  Medical Nutrition Therapy:  Appt start time: 1600  End time: 1630.  Primary concerns today: Post-operative Bariatric Surgery Nutrition Management.  Surgery date: 04/23/12 Surgery type: LAGB Start weight at [facility]: 318.5 lbs (01/31/12)  Weight today: 266.5 lbs Weight change: 15.5 lbs Total weight lost: 52.0 lbs BMI: 33.3 kg/m^2  Weight loss goal: 200-220 lbs % of goal met:  44-53%  TANITA  BODY COMP RESULTS  01/31/12 05/10/12 07/01/12  %Fat 46.9% 47.6% 34.8%  FM (lbs) 147.0 134.0 92.5  FFM (lbs) 166.5 148.0 174.0  TBW (lbs) 122.0 108.5 127.5   24-hr recall: B (AM): Protein shake (Premier - 30g) Snk (AM): None  L (PM): Premier shake or salad w/ 3 oz lean protein, lite dressing  Snk (PM): None  D (PM): Spaghetti sauce (no pasta), green beans Snk (PM): None  Fluid intake: 11 oz with shake; ~64 oz water Estimated total protein intake: 80+ g  Medications:  No longer taking prilosec Supplementation:  Only consistent with MVI; Not taking calcium at all; "can't remember". Discussed setting alarm on phone to remind him.   Using straws: No Drinking while eating: No Hair loss: No Carbonated beverages: No N/V/D/C: None Last Lap-Band fill:  2 cc on 06/13/12 (with Mardelle Matte). Feels he is on the border of green/yellow zones, though more towards green. No hunger between meals.   Recent physical activity:  None  Progress Towards Goal(s):  In progress.  Handouts given during visit include:  Phase 3B: High Protein + Non-Starchy Vegetables   Nutritional Diagnosis:  [place]-3.3 Overweight/obesity As related to recent LAGB surgery.  As evidenced by patient following post-op nutrition guidelines for continued weight loss.    Intervention:  Nutrition education.  Monitoring/Evaluation:  Dietary intake, exercise, lap band fills, and body weight. Follow up in 1-2 months for 3-4 month post-op visit.

## 2012-07-11 ENCOUNTER — Ambulatory Visit (INDEPENDENT_AMBULATORY_CARE_PROVIDER_SITE_OTHER): Payer: BC Managed Care – PPO | Admitting: Physician Assistant

## 2012-07-11 ENCOUNTER — Encounter (INDEPENDENT_AMBULATORY_CARE_PROVIDER_SITE_OTHER): Payer: Self-pay

## 2012-07-11 VITALS — BP 112/64 | HR 72 | Temp 97.4°F | Ht 75.0 in | Wt 266.6 lb

## 2012-07-11 DIAGNOSIS — Z4651 Encounter for fitting and adjustment of gastric lap band: Secondary | ICD-10-CM

## 2012-07-11 NOTE — Patient Instructions (Signed)
Take clear liquids tonight. Thin protein shakes are ok to start tomorrow morning. Slowly advance your diet thereafter. Call us if you have persistent vomiting or regurgitation, night cough or reflux symptoms. Return as scheduled or sooner if you notice no changes in hunger/portion sizes.  

## 2012-07-11 NOTE — Progress Notes (Signed)
  HISTORY: Gilbert [name] is a 41 y.o.male who received an AP-Large lap-band in July 2013 by Dr. Daphine Deutscher. He's lost 8 lbs since his last visit and he feels great. No regurgitation or reflux symptoms. He still feels like he can eat more than he should. He noted that his blood pressure is as low as it's ever been so he's curious as to whether he can discontinue one of his BP medications.  VITAL SIGNS: Filed Vitals:   07/11/12 1155  BP: 112/64  Pulse: 72  Temp: 97.4 F (36.3 C)    PHYSICAL EXAM: Physical exam reveals a very well-appearing 41 y.o.male in no apparent distress Neurologic: Awake, alert, oriented Psych: Bright affect, conversant Respiratory: Breathing even and unlabored. No stridor or wheezing Abdomen: Soft, nontender, nondistended to palpation. Incisions well-healed. No incisional hernias. Port easily palpated. Extremities: Atraumatic, good range of motion.  ASSESMENT: 41 y.o.  male  s/p AP-Large lap-band.   PLAN: The patient's port was accessed with a 20G Huber needle without difficulty. Clear fluid was aspirated and 1 mL saline was added to the port. The patient was able to swallow water without difficulty following the procedure and was instructed to take clear liquids for the next 24-48 hours and advance slowly as tolerated. I advised him to contact his primary physician for BP management and possible discontinuation of one of his medications.

## 2012-08-15 ENCOUNTER — Ambulatory Visit (INDEPENDENT_AMBULATORY_CARE_PROVIDER_SITE_OTHER): Payer: 59 | Admitting: Physician Assistant

## 2012-08-15 ENCOUNTER — Encounter (INDEPENDENT_AMBULATORY_CARE_PROVIDER_SITE_OTHER): Payer: BC Managed Care – PPO

## 2012-08-15 ENCOUNTER — Encounter (INDEPENDENT_AMBULATORY_CARE_PROVIDER_SITE_OTHER): Payer: Self-pay | Admitting: Physician Assistant

## 2012-08-15 VITALS — BP 114/72 | HR 74 | Temp 97.6°F | Ht 75.0 in | Wt 264.6 lb

## 2012-08-15 DIAGNOSIS — Z4651 Encounter for fitting and adjustment of gastric lap band: Secondary | ICD-10-CM

## 2012-08-15 NOTE — Progress Notes (Signed)
  HISTORY: Gilbert [name] is a 41 y.o.male who received an AP-Large lap-band in July 2013 by Dr. Daphine Deutscher. He comes in with two pounds of weight loss since last month. He denies regurgitation or reflux symptoms. His hunger is present but not voracious. He believes a fill would help him.  VITAL SIGNS: Filed Vitals:   08/15/12 0844  BP: 114/72  Pulse: 74  Temp: 97.6 F (36.4 C)    PHYSICAL EXAM: Physical exam reveals a very well-appearing 41 y.o.male in no apparent distress Neurologic: Awake, alert, oriented Psych: Bright affect, conversant Respiratory: Breathing even and unlabored. No stridor or wheezing Abdomen: Soft, nontender, nondistended to palpation. Incisions well-healed. No incisional hernias. Port easily palpated. Extremities: Atraumatic, good range of motion.  ASSESMENT: 41 y.o.  male  s/p AP-Large lap-band.   PLAN: The patient's port was accessed with a 20G Huber needle without difficulty. Clear fluid was aspirated and 1 mL saline was added to the port. The patient was able to swallow water without difficulty following the procedure and was instructed to take clear liquids for the next 24-48 hours and advance slowly as tolerated.

## 2012-08-15 NOTE — Patient Instructions (Signed)
Take clear liquids tonight. Thin protein shakes are ok to start tomorrow morning. Slowly advance your diet thereafter. Call us if you have persistent vomiting or regurgitation, night cough or reflux symptoms. Return as scheduled or sooner if you notice no changes in hunger/portion sizes.  

## 2012-09-02 ENCOUNTER — Ambulatory Visit: Payer: BC Managed Care – PPO | Admitting: *Deleted

## 2012-09-09 ENCOUNTER — Encounter: Payer: 59 | Attending: General Surgery | Admitting: *Deleted

## 2012-09-09 ENCOUNTER — Encounter: Payer: Self-pay | Admitting: *Deleted

## 2012-09-09 VITALS — Ht 75.0 in | Wt 266.0 lb

## 2012-09-09 DIAGNOSIS — Z01818 Encounter for other preprocedural examination: Secondary | ICD-10-CM | POA: Insufficient documentation

## 2012-09-09 DIAGNOSIS — Z713 Dietary counseling and surveillance: Secondary | ICD-10-CM | POA: Insufficient documentation

## 2012-09-09 DIAGNOSIS — E669 Obesity, unspecified: Secondary | ICD-10-CM | POA: Insufficient documentation

## 2012-09-09 NOTE — Progress Notes (Addendum)
Follow-up visit: 4.5 Months Post-Operative LAGB Surgery  Medical Nutrition Therapy:  Appt start time: 815  End time: 845.  Primary concerns today: Post-operative Bariatric Surgery Nutrition Management. Gilbert [name] believes his weight loss has stalled, though admits to overeating at Thanksgiving. 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 Reports some regurgitation after last fill (11/21), but is d/t eating too fast and is resolving. Still on green/yellow zone and able to eat larger portions at meals. Recently started working with Systems analyst at gym. Not taking supplements consistently.     Surgery date: 04/23/12 Surgery type: LAGB Start weight at [facility]: 318.5 lbs (01/31/12)  Weight today: 266.0 lbs Weight change: 0.5 lbs Total weight lost: 52.5 lbs BMI: 33.2 kg/m^2  Weight loss goal: 200-220 lbs % of goal met:  44-53%  TANITA  BODY COMP RESULTS  01/31/12 05/10/12 07/01/12 09/09/12  FM (lbs) 147.0 134.0 92.5 96.5  FFM (lbs) 166.5 148.0 174.0 169.5  TBW (lbs) 122.0 108.5 127.5 124.0   24-hr recall: B (AM): Protein shake (Premier - 30g) - [place], muffin when OOT Snk (AM): None  L (PM):  [place] and ham sub 6" on flatbread OR [place] & cheese slices Snk (PM): None  D (PM): 3 oz lean protein, veggies Snk (PM): None  Fluid intake: 11 oz shake; ~64 oz water Estimated total protein intake: 80 g  Medications:  No longer taking prilosec Supplementation:  Inconsistent; Takes 1 calcium dose in a.m. with other meds. Usually forgets all other doses and MVI   Using straws: No Drinking while eating: No Hair loss: No Carbonated beverages: No N/V/D/C: Approx 2x/week Last Lap-Band fill:  10/17 and 11/21. Reports trouble since last fill with regurgitation 2x/wk, though states it is definitely d/t eating too fast. Resolving. Continues ability to eat large portions at meals and is still on the border of yellow/green zone. ? if he needs another small  fill...  Recent physical activity:  Recently joined the [facility]; does 30 min of cardio, then works with Systems analyst 2x/week. Plans to increase to 4x/week.  Progress Towards Goal(s):  In progress.   Nutritional Diagnosis:  [place]-3.3 Overweight/obesity As related to recent LAGB surgery.  As evidenced by patient following post-op nutrition guidelines for continued weight loss.    Intervention:  Nutrition education.  Monitoring/Evaluation:  Dietary intake, exercise, lap band fills, and body weight. Follow up in 3 months for 7-8 month post-op visit.

## 2012-09-09 NOTE — Patient Instructions (Addendum)
Goals:  Follow Phase 3B: High Protein + Non-Starchy Vegetables  Increase lean protein foods to meet 80g goal  Increase fluid intake to 64oz +  Can try adding 15 grams of carbohydrate (fruit, whole grain) with meals - start with 1 meal a day  Avoid drinking 15 minutes before, during and 30 minutes after eating  Aim for >30 min of physical activity daily  Resume consistency with supplements (MVI - breakfast; Calcium 500 mg - lunch, dinner, bed)

## 2012-09-12 ENCOUNTER — Encounter (INDEPENDENT_AMBULATORY_CARE_PROVIDER_SITE_OTHER): Payer: Self-pay

## 2012-09-12 ENCOUNTER — Ambulatory Visit (INDEPENDENT_AMBULATORY_CARE_PROVIDER_SITE_OTHER): Payer: 59 | Admitting: Physician Assistant

## 2012-09-12 VITALS — BP 117/84 | HR 78 | Temp 97.6°F | Resp 16 | Ht 75.0 in | Wt 260.8 lb

## 2012-09-12 DIAGNOSIS — Z4651 Encounter for fitting and adjustment of gastric lap band: Secondary | ICD-10-CM

## 2012-09-12 NOTE — Progress Notes (Signed)
  HISTORY: Gilbert [name] is a 41 y.o.male who received an AP-Large lap-band in July 2013 by Dr. Daphine Deutscher. He comes in with almost 4 lbs of weight loss. He still has some hunger and he says his portion sizes are larger than desired. He has no persistent regurgitation but does notice issues if he eats too quickly. He's working with a Psychologist, educational and is doing cardiovascular exercise as well. He would like a small fill today to help continue weight loss.  VITAL SIGNS: Filed Vitals:   09/12/12 0841  BP: 117/84  Pulse: 78  Temp: 97.6 F (36.4 C)  Resp: 16    PHYSICAL EXAM: Physical exam reveals a very well-appearing 41 y.o.male in no apparent distress Neurologic: Awake, alert, oriented Psych: Bright affect, conversant Respiratory: Breathing even and unlabored. No stridor or wheezing Abdomen: Soft, nontender, nondistended to palpation. Incisions well-healed. No incisional hernias. Port easily palpated. Extremities: Atraumatic, good range of motion.  ASSESMENT: 41 y.o.  male  s/p AP-Large lap-band.   PLAN: The patient's port was accessed with a 20G Huber needle without difficulty. Clear fluid was aspirated and 0.5 mL saline was added to the port. The patient was able to swallow water without difficulty following the procedure and was instructed to take clear liquids for the next 24-48 hours and advance slowly as tolerated.

## 2012-09-12 NOTE — Patient Instructions (Signed)
Take clear liquids tonight. Thin protein shakes are ok to start tomorrow morning. Slowly advance your diet thereafter. Call us if you have persistent vomiting or regurgitation, night cough or reflux symptoms. Return as scheduled or sooner if you notice no changes in hunger/portion sizes.  

## 2012-10-10 ENCOUNTER — Encounter (INDEPENDENT_AMBULATORY_CARE_PROVIDER_SITE_OTHER): Payer: 59

## 2012-10-24 ENCOUNTER — Ambulatory Visit (INDEPENDENT_AMBULATORY_CARE_PROVIDER_SITE_OTHER): Payer: 59 | Admitting: Physician Assistant

## 2012-10-24 ENCOUNTER — Encounter (INDEPENDENT_AMBULATORY_CARE_PROVIDER_SITE_OTHER): Payer: Self-pay

## 2012-10-24 ENCOUNTER — Telehealth: Payer: Self-pay | Admitting: Family Medicine

## 2012-10-24 VITALS — BP 120/80 | HR 72 | Temp 97.8°F | Resp 18 | Ht 75.0 in | Wt 259.4 lb

## 2012-10-24 DIAGNOSIS — Z4651 Encounter for fitting and adjustment of gastric lap band: Secondary | ICD-10-CM

## 2012-10-24 MED ORDER — HYDROCHLOROTHIAZIDE 25 MG PO TABS: 25.0000 mg | ORAL_TABLET | Freq: Every morning | ORAL | Status: DC

## 2012-10-24 MED ORDER — ENALAPRIL MALEATE 20 MG PO TABS: 40.0000 mg | ORAL_TABLET | ORAL | Status: DC

## 2012-10-24 NOTE — Progress Notes (Signed)
  HISTORY: MANRAJ YEO is a 42 y.o.male who received an AP-Large lap-band in July 2013 by Dr. Daphine Deutscher. He comes in with modest weight loss since before the holidays. He denies persistent regurgitation or reflux but he recalls occasional regurgitation with eating too quickly or not chewing adequately. He's engaged in an exercise program and has noticed his clothing fitting looser as a result. He would like a fill today because of persistent hunger and larger than desired portion sizes.  VITAL SIGNS: Filed Vitals:   10/24/12 0858  BP: 120/80  Pulse: 72  Temp: 97.8 F (36.6 C)  Resp: 18    PHYSICAL EXAM: Physical exam reveals a very well-appearing 41 y.o.male in no apparent distress Neurologic: Awake, alert, oriented Psych: Bright affect, conversant Respiratory: Breathing even and unlabored. No stridor or wheezing Abdomen: Soft, nontender, nondistended to palpation. Incisions well-healed. No incisional hernias. Port easily palpated. Extremities: Atraumatic, good range of motion.  ASSESMENT: 42 y.o.  male  s/p AP-Large lap-band.   PLAN: The patient's port was accessed with a 20G Huber needle without difficulty. Clear fluid was aspirated and 0.5 mL saline was added to the port. The patient was able to swallow water without difficulty following the procedure and was instructed to take clear liquids for the next 24-48 hours and advance slowly as tolerated.

## 2012-10-24 NOTE — Patient Instructions (Signed)
Take clear liquids tonight. Thin protein shakes are ok to start tomorrow morning. Slowly advance your diet thereafter. Call us if you have persistent vomiting or regurgitation, night cough or reflux symptoms. Return as scheduled or sooner if you notice no changes in hunger/portion sizes.  

## 2012-10-24 NOTE — Telephone Encounter (Signed)
Also refill: Enalapril maleate 20 mg tab. Take 1 tablet by mouth 2 times daily. Qty 60. Last fill 08-29-12

## 2012-10-24 NOTE — Telephone Encounter (Signed)
Refill done.  

## 2012-10-24 NOTE — Telephone Encounter (Signed)
Refill: Hydrochlorothiazide 25 mg tab. Take 1 tablet by mouth every day. Qty 30. Last fill 08-29-12

## 2012-11-11 ENCOUNTER — Ambulatory Visit (INDEPENDENT_AMBULATORY_CARE_PROVIDER_SITE_OTHER): Payer: 59 | Admitting: General Surgery

## 2012-11-11 ENCOUNTER — Encounter (INDEPENDENT_AMBULATORY_CARE_PROVIDER_SITE_OTHER): Payer: Self-pay | Admitting: General Surgery

## 2012-11-11 ENCOUNTER — Telehealth (INDEPENDENT_AMBULATORY_CARE_PROVIDER_SITE_OTHER): Payer: Self-pay | Admitting: General Surgery

## 2012-11-11 VITALS — BP 116/74 | HR 70 | Temp 97.0°F | Resp 16 | Ht 75.0 in | Wt 258.6 lb

## 2012-11-11 DIAGNOSIS — R1310 Dysphagia, unspecified: Secondary | ICD-10-CM

## 2012-11-11 DIAGNOSIS — Z9884 Bariatric surgery status: Secondary | ICD-10-CM

## 2012-11-11 NOTE — Progress Notes (Signed)
Subjective:     Patient ID: Gilbert [name], male   DOB: 1971/06/19, 42 y.o.   MRN: [id number]  HPI This patientpresents today for evaluation of a LAP-BAND adjustment. He had a LAP-BAND placement by Dr. Daphine Deutscher in July of 2013.  He has been doing well and had his last adjustment about 2 weeks ago where 0.5 cc of fluid was added. He says that he feels very tight in the morning and only takes protein shake for breakfast and after this is when he begins to have some problems. He says he has difficult time with lunch about every other day where he feels as though food is getting stuck and has been throwing up about every other day as well. He is concerned as he is leaving out of town in 2 days and is concerned that he might be too tight and doesn't want to leave town and have problems.  Review of Systems     Objective:   Physical Exam No distress and nontoxic-appearing I aspirated his band on the first attempt with clear fluid and removed about 0.5 cc of fluid    Assessment:     Dysphagia status post lap band I removed a 0.5 cc of fluid today at the patient's request. I think that this is probably the best move regardless. After he returns from his trip we will see him back and reevaluate his progress and consider adding some of the fluid.     Plan:     He will follow up with Dr. Daphine Deutscher when he returns

## 2012-11-11 NOTE — Telephone Encounter (Signed)
Pt called to report recent problems with lap band.  He is having trouble with solids and some some vomiting.  Recommended he go back to protein shakes for the next several days, then begin to add solids back in.  He was offered appt at [facility] on Thursday, but is going out-of-town on business (leaving Wednesday evening and returning Sunday.)  He is worried he will "get in trouble" while is out of state.  Offered urgent office today to get fluid removed before his trip which pt agreed was the best course for him.

## 2012-11-22 ENCOUNTER — Ambulatory Visit (INDEPENDENT_AMBULATORY_CARE_PROVIDER_SITE_OTHER): Payer: 59 | Admitting: Family Medicine

## 2012-11-22 ENCOUNTER — Encounter: Payer: Self-pay | Admitting: Family Medicine

## 2012-11-22 VITALS — BP 116/78 | HR 88 | Temp 98.0°F | Ht 74.5 in | Wt 261.2 lb

## 2012-11-22 DIAGNOSIS — Z0000 Encounter for general adult medical examination without abnormal findings: Secondary | ICD-10-CM

## 2012-11-22 LAB — LIPID PANEL
Cholesterol: 158 mg/dL (ref 0–200)
LDL Cholesterol: 74 mg/dL (ref 0–99)
Triglycerides: 47 mg/dL (ref 0.0–149.0)

## 2012-11-22 LAB — BASIC METABOLIC PANEL
BUN: 18 mg/dL (ref 6–23)
CO2: 28 mEq/L (ref 19–32)
Calcium: 9.4 mg/dL (ref 8.4–10.5)
Chloride: 100 mEq/L (ref 96–112)
Creatinine, Ser: 1 mg/dL (ref 0.4–1.5)
Glucose, Bld: 77 mg/dL (ref 70–99)

## 2012-11-22 LAB — CBC WITH DIFFERENTIAL/PLATELET
Basophils Absolute: 0 10*3/uL (ref 0.0–0.1)
Basophils Relative: 0.3 % (ref 0.0–3.0)
HCT: 42.3 % (ref 39.0–52.0)
Hemoglobin: 14.4 g/dL (ref 13.0–17.0)
Lymphocytes Relative: 18.6 % (ref 12.0–46.0)
Lymphs Abs: 1.6 10*3/uL (ref 0.7–4.0)
Monocytes Relative: 6.3 % (ref 3.0–12.0)
Neutro Abs: 6.1 10*3/uL (ref 1.4–7.7)
RBC: 4.28 Mil/uL (ref 4.22–5.81)
RDW: 12.7 % (ref 11.5–14.6)

## 2012-11-22 LAB — HEPATIC FUNCTION PANEL
ALT: 31 U/L (ref 0–53)
Albumin: 4.3 g/dL (ref 3.5–5.2)
Bilirubin, Direct: 0.1 mg/dL (ref 0.0–0.3)
Total Protein: 7.1 g/dL (ref 6.0–8.3)

## 2012-11-22 LAB — TSH: TSH: 1.68 u[IU]/mL (ref 0.35–5.50)

## 2012-11-22 NOTE — Progress Notes (Signed)
  Subjective:    Patient ID: Gilbert [name], male    DOB: 03/12/1971, 42 y.o.   MRN: [id number]  HPI CPE- no concerns.  Has lost 50 lbs since lap band this summer.  Has started running and doing boot camp.   Review of Systems Patient reports no vision/hearing changes, anorexia, fever ,adenopathy, persistant/recurrent hoarseness, swallowing issues, chest pain, palpitations, edema, persistant/recurrent cough, hemoptysis, dyspnea (rest,exertional, paroxysmal nocturnal), gastrointestinal  bleeding (melena, rectal bleeding), abdominal pain, excessive heart burn, GU symptoms (dysuria, hematuria, voiding/incontinence issues) syncope, focal weakness, memory loss, numbness & tingling, skin/hair/nail changes, depression, anxiety, abnormal bruising/bleeding, musculoskeletal symptoms/signs.     Objective:   Physical Exam BP 116/78  Pulse 88  Temp(Src) 98 F (36.7 C) (Tympanic)  Ht 6' 2.5" (1.892 m)  Wt 261 lb 3.2 oz (118.48 kg)  BMI 33.1 kg/m2  SpO2 99%  General Appearance:    Alert, cooperative, no distress, appears stated age  Head:    Normocephalic, without obvious abnormality, atraumatic  Eyes:    PERRL, conjunctiva/corneas clear, EOM's intact, fundi    benign, both eyes       Ears:    Normal TM's and external ear canals, both ears  Nose:   Nares normal, septum midline, mucosa normal, no drainage   or sinus tenderness  Throat:   Lips, mucosa, and tongue normal; teeth and gums normal  Neck:   Supple, symmetrical, trachea midline, no adenopathy;       thyroid:  No enlargement/tenderness/nodules  Back:     Symmetric, no curvature, ROM normal, no CVA tenderness  Lungs:     Clear to auscultation bilaterally, respirations unlabored  Chest wall:    No tenderness or deformity  Heart:    Regular rate and rhythm, S1 and S2 normal, no murmur, rub   or gallop  Abdomen:     Soft, non-tender, bowel sounds active all four quadrants,    no masses, no organomegaly  Genitalia:    Normal male without  lesion, discharge or tenderness  Rectal:    Deferred due to young age  Extremities:   Extremities normal, atraumatic, no cyanosis or edema  Pulses:   2+ and symmetric all extremities  Skin:   Skin color, texture, turgor normal, no rashes or lesions  Lymph nodes:   Cervical, supraclavicular, and axillary nodes normal  Neurologic:   CNII-XII intact. Normal strength, sensation and reflexes      throughout          Assessment & Plan:

## 2012-11-22 NOTE — Assessment & Plan Note (Signed)
Pt's PE WNL.  Check labs.  Anticipatory guidance provided.  

## 2012-11-22 NOTE — Patient Instructions (Addendum)
Follow up in 4-8 weeks to recheck BP STOP the HCTZ, continue the enalapril We'll notify you of your lab results and make any changes if needed Keep up the good work!  I'm so proud of you!

## 2012-11-25 ENCOUNTER — Encounter: Payer: Self-pay | Admitting: *Deleted

## 2012-12-05 ENCOUNTER — Ambulatory Visit (INDEPENDENT_AMBULATORY_CARE_PROVIDER_SITE_OTHER): Payer: 59 | Admitting: Physician Assistant

## 2012-12-05 ENCOUNTER — Encounter (INDEPENDENT_AMBULATORY_CARE_PROVIDER_SITE_OTHER): Payer: Self-pay | Admitting: Physician Assistant

## 2012-12-05 VITALS — BP 118/80 | HR 72 | Resp 18 | Ht 75.0 in | Wt 260.8 lb

## 2012-12-05 DIAGNOSIS — Z4651 Encounter for fitting and adjustment of gastric lap band: Secondary | ICD-10-CM

## 2012-12-05 NOTE — Progress Notes (Signed)
  HISTORY: Gilbert [name] is a 42 y.o.male who received an AP-Large lap-band in July 2013 by Gilbert [name]. He comes in with a slight increase in weight since seeing Gilbert [name] at the end of February for obstructive symptoms for which 0.5 mL (his last fill) was removed. He has no further symptoms of over-restriction. He attributed this to sinus drainage. He still has hunger and larger than desired portions. He would like a small fill today to keep his weight loss going. He's continuing his exercise program as well.  VITAL SIGNS: Filed Vitals:   12/05/12 0844  BP: 118/80  Pulse: 72  Resp: 18    PHYSICAL EXAM: Physical exam reveals a very well-appearing 41 y.o.male in no apparent distress Neurologic: Awake, alert, oriented Psych: Bright affect, conversant Respiratory: Breathing even and unlabored. No stridor or wheezing Abdomen: Soft, nontender, nondistended to palpation. Incisions well-healed. No incisional hernias. Port easily palpated. Extremities: Atraumatic, good range of motion.  ASSESMENT: 42 y.o.  male  s/p AP-Large lap-band.   PLAN: The patient's port was accessed with a 20G Huber needle without difficulty. Clear fluid was aspirated and 0.25 mL saline was added to the port. The patient was able to swallow water without difficulty following the procedure and was instructed to take clear liquids for the next 24-48 hours and advance slowly as tolerated.

## 2012-12-05 NOTE — Patient Instructions (Signed)
Take clear liquids tonight. Thin protein shakes are ok to start tomorrow morning. Slowly advance your diet thereafter. Call us if you have persistent vomiting or regurgitation, night cough or reflux symptoms. Return as scheduled or sooner if you notice no changes in hunger/portion sizes.  

## 2012-12-07 ENCOUNTER — Other Ambulatory Visit: Payer: Self-pay | Admitting: Internal Medicine

## 2012-12-09 ENCOUNTER — Ambulatory Visit: Payer: 59 | Admitting: *Deleted

## 2012-12-09 NOTE — Telephone Encounter (Signed)
Refill done.  

## 2013-01-02 ENCOUNTER — Encounter (INDEPENDENT_AMBULATORY_CARE_PROVIDER_SITE_OTHER): Payer: 59

## 2013-01-06 ENCOUNTER — Encounter: Payer: Self-pay | Admitting: Family Medicine

## 2013-01-06 ENCOUNTER — Ambulatory Visit (INDEPENDENT_AMBULATORY_CARE_PROVIDER_SITE_OTHER): Payer: 59 | Admitting: Family Medicine

## 2013-01-06 VITALS — BP 128/82 | HR 65 | Temp 98.3°F | Ht 74.5 in | Wt 268.2 lb

## 2013-01-06 DIAGNOSIS — I10 Essential (primary) hypertension: Secondary | ICD-10-CM

## 2013-01-06 MED ORDER — CYCLOSPORINE 0.05 % OP EMUL: 1.0000 [drp] | Freq: Every morning | OPHTHALMIC | Status: DC

## 2013-01-06 NOTE — Patient Instructions (Addendum)
Follow up in August to recheck BP Continue the Enalapril daily Restart the Restasis Call with any questions or concerns Happy Spring!!

## 2013-01-06 NOTE — Progress Notes (Signed)
  Subjective:    Patient ID: Gilbert [name], male    DOB: October 04, 1970, 42 y.o.   MRN: [id number]  HPI HTN- stopped HCTZ at last visit.  Remains on Enalapril.  BP well controlled today.  No CP, SOB, HAs, visual changes, edema.  Exercising regularly.   Review of Systems For ROS see HPI     Objective:   Physical Exam  Vitals reviewed. Constitutional: He is oriented to person, place, and time. He appears well-developed and well-nourished. No distress.  HENT:  Head: Normocephalic and atraumatic.  Eyes: Conjunctivae and EOM are normal. Pupils are equal, round, and reactive to light.  Neck: Normal range of motion. Neck supple. No thyromegaly present.  Cardiovascular: Normal rate, regular rhythm, normal heart sounds and intact distal pulses.   No murmur heard. Pulmonary/Chest: Effort normal and breath sounds normal. No respiratory distress.  Abdominal: Soft. Bowel sounds are normal. He exhibits no distension.  Musculoskeletal: He exhibits no edema.  Lymphadenopathy:    He has no cervical adenopathy.  Neurological: He is alert and oriented to person, place, and time. No cranial nerve deficit.  Skin: Skin is warm and dry.  Psychiatric: He has a normal mood and affect. His behavior is normal.          Assessment & Plan:

## 2013-01-09 ENCOUNTER — Ambulatory Visit: Payer: 59 | Admitting: Family Medicine

## 2013-01-13 NOTE — Assessment & Plan Note (Signed)
Chronic problem.  Stable.  BP remains adequately controlled since stopping HCTZ.  Asymptomatic.  No changes.  Will follow.

## 2013-01-14 ENCOUNTER — Telehealth: Payer: Self-pay | Admitting: Family Medicine

## 2013-01-14 NOTE — Telephone Encounter (Signed)
Patient called this morning to get status of prior authorization for restasis. Per Sunny Schlein, this was completed and faxed on 01/09/13 and we are awaiting a response. Called pt at [phone] and left message to call office.

## 2013-01-16 ENCOUNTER — Ambulatory Visit (INDEPENDENT_AMBULATORY_CARE_PROVIDER_SITE_OTHER): Payer: 59 | Admitting: Physician Assistant

## 2013-01-16 ENCOUNTER — Encounter (INDEPENDENT_AMBULATORY_CARE_PROVIDER_SITE_OTHER): Payer: Self-pay

## 2013-01-16 VITALS — HR 70 | Temp 97.5°F | Resp 18 | Ht 75.0 in | Wt 259.4 lb

## 2013-01-16 DIAGNOSIS — Z4651 Encounter for fitting and adjustment of gastric lap band: Secondary | ICD-10-CM

## 2013-01-16 NOTE — Telephone Encounter (Signed)
Paperwork refaxed to [employer] with changes made on 4/24. JLT

## 2013-01-16 NOTE — Patient Instructions (Signed)
Take clear liquids tonight. Thin protein shakes are ok to start tomorrow morning. Slowly advance your diet thereafter. Call us if you have persistent vomiting or regurgitation, night cough or reflux symptoms. Return as scheduled or sooner if you notice no changes in hunger/portion sizes.  

## 2013-01-16 NOTE — Progress Notes (Signed)
  HISTORY: Gilbert [name] is a 42 y.o.male who received an AP-Large lap-band in July 2013 by Dr. Daphine Deutscher. He comes in with 1.5 lbs weight loss. No persistent regurgitation or reflux. He says he's still eating more than desired but he manages to finish a small meal in 20-25 minutes. He would like a fill today.  VITAL SIGNS: Filed Vitals:   01/16/13 1133  Pulse: 70  Temp: 97.5 F (36.4 C)  Resp: 18    PHYSICAL EXAM: Physical exam reveals a very well-appearing 41 y.o.male in no apparent distress Neurologic: Awake, alert, oriented Psych: Bright affect, conversant Respiratory: Breathing even and unlabored. No stridor or wheezing Abdomen: Soft, nontender, nondistended to palpation. Incisions well-healed. No incisional hernias. Port easily palpated. Extremities: Atraumatic, good range of motion.  ASSESMENT: 42 y.o.  male  s/p AP-Large lap-band.   PLAN: The patient's port was accessed with a 20G Huber needle without difficulty. Clear fluid was aspirated and 0.25 mL saline was added to the port. The patient was able to swallow water without difficulty following the procedure and was instructed to take clear liquids for the next 24-48 hours and advance slowly as tolerated.

## 2013-01-16 NOTE — Telephone Encounter (Signed)
Questions answered and given to Lorene Dy, CMA who will try and get approval as a restart

## 2013-01-16 NOTE — Telephone Encounter (Signed)
Spoke with Rep PA will be denied unless question 1 and 2 are changed. .Please advise if these question can be changed or if they are correct. PA placed on ledge for review.Please advise

## 2013-01-17 NOTE — Telephone Encounter (Signed)
Per [employer], Medication was denied again.

## 2013-01-20 ENCOUNTER — Telehealth: Payer: Self-pay | Admitting: *Deleted

## 2013-01-20 NOTE — Telephone Encounter (Signed)
See telephone encounter.//AB/CMA 

## 2013-01-20 NOTE — Telephone Encounter (Signed)
Lm @ (8:23am) asking the to RTC.//AB/CMA

## 2013-01-20 NOTE — Telephone Encounter (Signed)
Patient states that he is returning your phone call. [employer] # [phone] and he says that it is okay to leave a detailed message

## 2013-01-20 NOTE — Telephone Encounter (Signed)
Called and left message informing the pt that his ins denied the Restasis, so Dr. Beverely Low needs for him to bring in his formulary so we can see for his ins will pay for.  Asked the pt if he has any questions to please give me a call back.//AB/CMA

## 2013-02-20 ENCOUNTER — Encounter (INDEPENDENT_AMBULATORY_CARE_PROVIDER_SITE_OTHER): Payer: Self-pay

## 2013-02-20 ENCOUNTER — Ambulatory Visit (INDEPENDENT_AMBULATORY_CARE_PROVIDER_SITE_OTHER): Payer: 59 | Admitting: Physician Assistant

## 2013-02-20 DIAGNOSIS — Z9884 Bariatric surgery status: Secondary | ICD-10-CM

## 2013-02-20 NOTE — Patient Instructions (Signed)
Return in one month. Focus on good food choices as well as physical activity. Return sooner if you have an increase in hunger, portion sizes or weight. Return also for difficulty swallowing, night cough, reflux.   

## 2013-02-20 NOTE — Progress Notes (Signed)
  HISTORY: DONG NIMMONS is a 42 y.o.male who received an AP-Large lap-band in July 2013 by Dr. Daphine Deutscher. He comes in with 3 lbs weight loss since his last visit one month ago. He denies persistent regurgitation or reflux symptoms. His hunger is under excellent control and his portion sizes remain small. He has completed a 5k run in 30 minutes and he's purchased a mountain bike, which he did as a result of reaching a weight loss goal. He doesn't feel like he needs an adjustment today. He has also discontinued one of his hypertension medications.  VITAL SIGNS: Filed Vitals:   02/20/13 0841  Pulse: 76  Temp: 97 F (36.1 C)  Resp: 18    PHYSICAL EXAM: Physical exam reveals a very well-appearing 41 y.o.male in no apparent distress Neurologic: Awake, alert, oriented Psych: Bright affect, conversant Respiratory: Breathing even and unlabored. No stridor or wheezing Extremities: Atraumatic, good range of motion. Skin: Warm, Dry, no rashes Musculoskeletal: Normal gait, Joints normal  ASSESMENT: 42 y.o.  male  s/p AP-Large lap-band.   PLAN: As he is in the green zone, we opted to leave the band as-is today. We talked about continuing his exercise program and to follow-up in one month unless he needs to be seen earlier. He voiced understanding and agreement.

## 2013-03-20 ENCOUNTER — Encounter (INDEPENDENT_AMBULATORY_CARE_PROVIDER_SITE_OTHER): Payer: 59

## 2013-03-27 ENCOUNTER — Encounter (INDEPENDENT_AMBULATORY_CARE_PROVIDER_SITE_OTHER): Payer: Self-pay

## 2013-03-27 ENCOUNTER — Ambulatory Visit (INDEPENDENT_AMBULATORY_CARE_PROVIDER_SITE_OTHER): Payer: 59 | Admitting: Physician Assistant

## 2013-03-27 DIAGNOSIS — Z9884 Bariatric surgery status: Secondary | ICD-10-CM

## 2013-03-27 NOTE — Progress Notes (Signed)
  HISTORY: Gilbert [name] is a 42 y.o.male who received an AP-Large lap-band in July 2013 by Dr. Daphine Deutscher. He comes in with 4.4 more pounds of weight loss since his last visit. He denies hunger, large portion sizes or persistent reflux/regurgitation. He is exercising regularly. He does not feel like he needs an adjustment today.  VITAL SIGNS: Filed Vitals:   03/27/13 0847  BP: 120/84  Pulse: 73  Resp: 18    PHYSICAL EXAM: Physical exam reveals a very well-appearing 41 y.o.male in no apparent distress Neurologic: Awake, alert, oriented Psych: Bright affect, conversant Respiratory: Breathing even and unlabored. No stridor or wheezing Extremities: Atraumatic, good range of motion. Skin: Warm, Dry, no rashes Musculoskeletal: Normal gait, Joints normal  ASSESMENT: 42 y.o.  male  s/p AP-Large lap-band.   PLAN: We deferred a fill as he's in the green zone. We'll have him back in one month for his one-year visit. I asked him to contact us should he lose restriction or have difficulty swallowing.

## 2013-03-27 NOTE — Patient Instructions (Signed)
Return in one month. Focus on good food choices as well as physical activity. Return sooner if you have an increase in hunger, portion sizes or weight. Return also for difficulty swallowing, night cough, reflux.   

## 2013-05-01 ENCOUNTER — Encounter (INDEPENDENT_AMBULATORY_CARE_PROVIDER_SITE_OTHER): Payer: 59

## 2013-05-08 ENCOUNTER — Encounter: Payer: Self-pay | Admitting: Family Medicine

## 2013-05-08 ENCOUNTER — Ambulatory Visit (INDEPENDENT_AMBULATORY_CARE_PROVIDER_SITE_OTHER): Payer: 59 | Admitting: Family Medicine

## 2013-05-08 VITALS — BP 136/84 | HR 77 | Temp 98.3°F | Ht 74.5 in | Wt 256.0 lb

## 2013-05-08 DIAGNOSIS — I10 Essential (primary) hypertension: Secondary | ICD-10-CM

## 2013-05-08 NOTE — Assessment & Plan Note (Signed)
Chronic problem.  Adequate control.  Asymptomatic.  No anticipated changes.

## 2013-05-08 NOTE — Patient Instructions (Addendum)
Schedule your complete physical for Feb Keep up the good work!  You look great! Call with any questions or concerns Enjoy the rest of summer!!

## 2013-05-08 NOTE — Progress Notes (Signed)
  Subjective:    Patient ID: Gilbert [name], male    DOB: 01-01-1971, 42 y.o.   MRN: [id number]  HPI HTN- chronic problem, BP slightly elevated today b/c pt came directly from work.  BP last month at [facility] was 120/84.  No CP, SOB, HAs, visual changes, edema.   Review of Systems For ROS see HPI     Objective:   Physical Exam  Vitals reviewed. Constitutional: He is oriented to person, place, and time. He appears well-developed and well-nourished. No distress.  HENT:  Head: Normocephalic and atraumatic.  Eyes: Conjunctivae and EOM are normal. Pupils are equal, round, and reactive to light.  Neck: Normal range of motion. Neck supple. No thyromegaly present.  Cardiovascular: Normal rate, regular rhythm, normal heart sounds and intact distal pulses.   No murmur heard. Pulmonary/Chest: Effort normal and breath sounds normal. No respiratory distress.  Abdominal: Soft. Bowel sounds are normal. He exhibits no distension.  Musculoskeletal: He exhibits no edema.  Lymphadenopathy:    He has no cervical adenopathy.  Neurological: He is alert and oriented to person, place, and time. No cranial nerve deficit.  Skin: Skin is warm and dry.  Psychiatric: He has a normal mood and affect. His behavior is normal.          Assessment & Plan:

## 2013-05-20 ENCOUNTER — Other Ambulatory Visit: Payer: Self-pay | Admitting: Family Medicine

## 2013-05-21 NOTE — Telephone Encounter (Signed)
Med filled.  

## 2013-05-29 ENCOUNTER — Encounter (INDEPENDENT_AMBULATORY_CARE_PROVIDER_SITE_OTHER): Payer: 59

## 2013-06-26 ENCOUNTER — Encounter (INDEPENDENT_AMBULATORY_CARE_PROVIDER_SITE_OTHER): Payer: 59

## 2013-07-24 ENCOUNTER — Encounter (INDEPENDENT_AMBULATORY_CARE_PROVIDER_SITE_OTHER): Payer: 59

## 2013-07-31 ENCOUNTER — Encounter (INDEPENDENT_AMBULATORY_CARE_PROVIDER_SITE_OTHER): Payer: 59

## 2013-08-14 ENCOUNTER — Encounter (INDEPENDENT_AMBULATORY_CARE_PROVIDER_SITE_OTHER): Payer: 59

## 2013-09-13 DIAGNOSIS — J45909 Unspecified asthma, uncomplicated: Secondary | ICD-10-CM | POA: Insufficient documentation

## 2013-09-13 HISTORY — DX: Unspecified asthma, uncomplicated: J45.909

## 2013-10-04 IMAGING — CR DG CHEST 2V
2 series · 2 of 2 positions shown · non-contrast
Comparison: None.

CLINICAL DATA: Preop gastric banding

CHEST - 2 VIEW

[w chest pa]
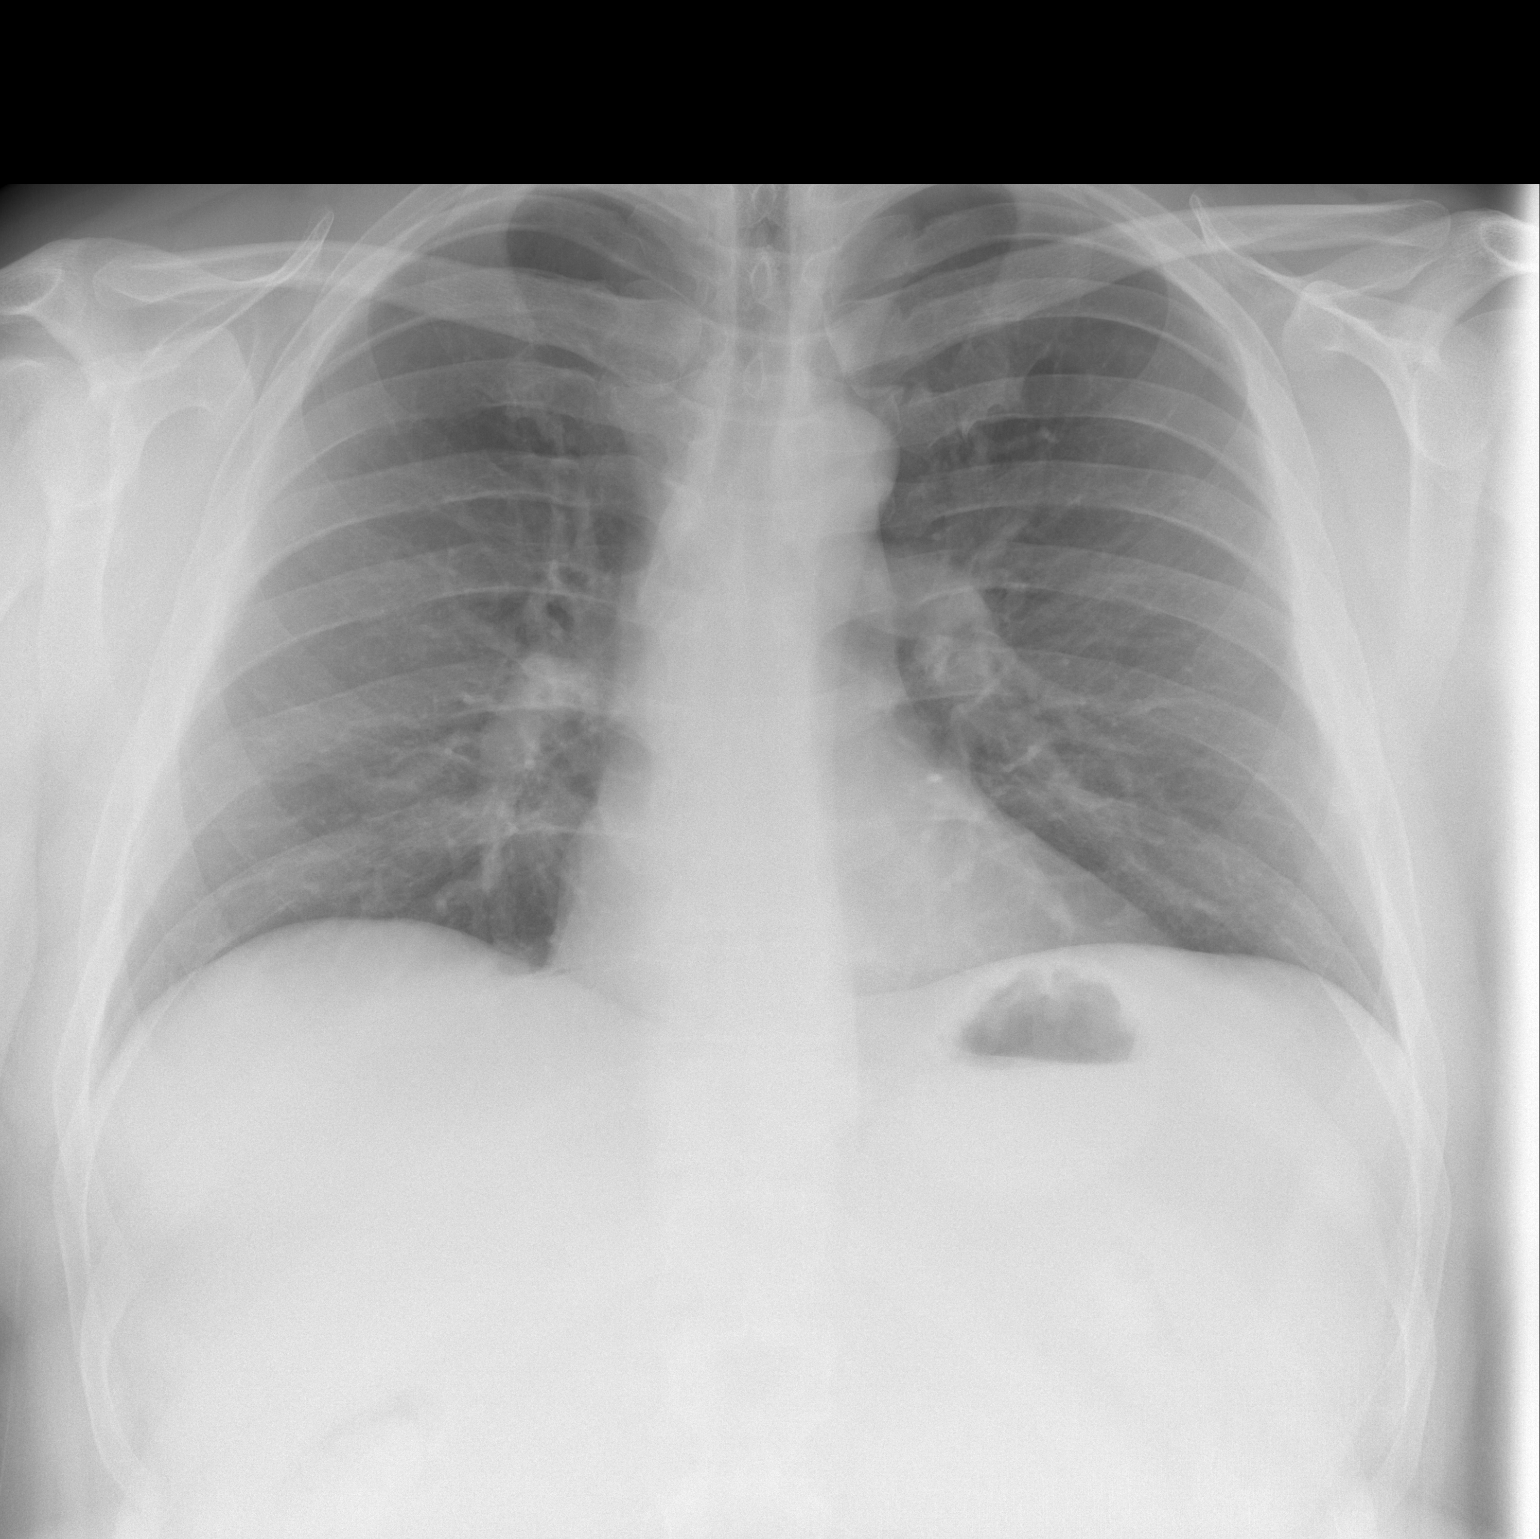

[w chest lat]
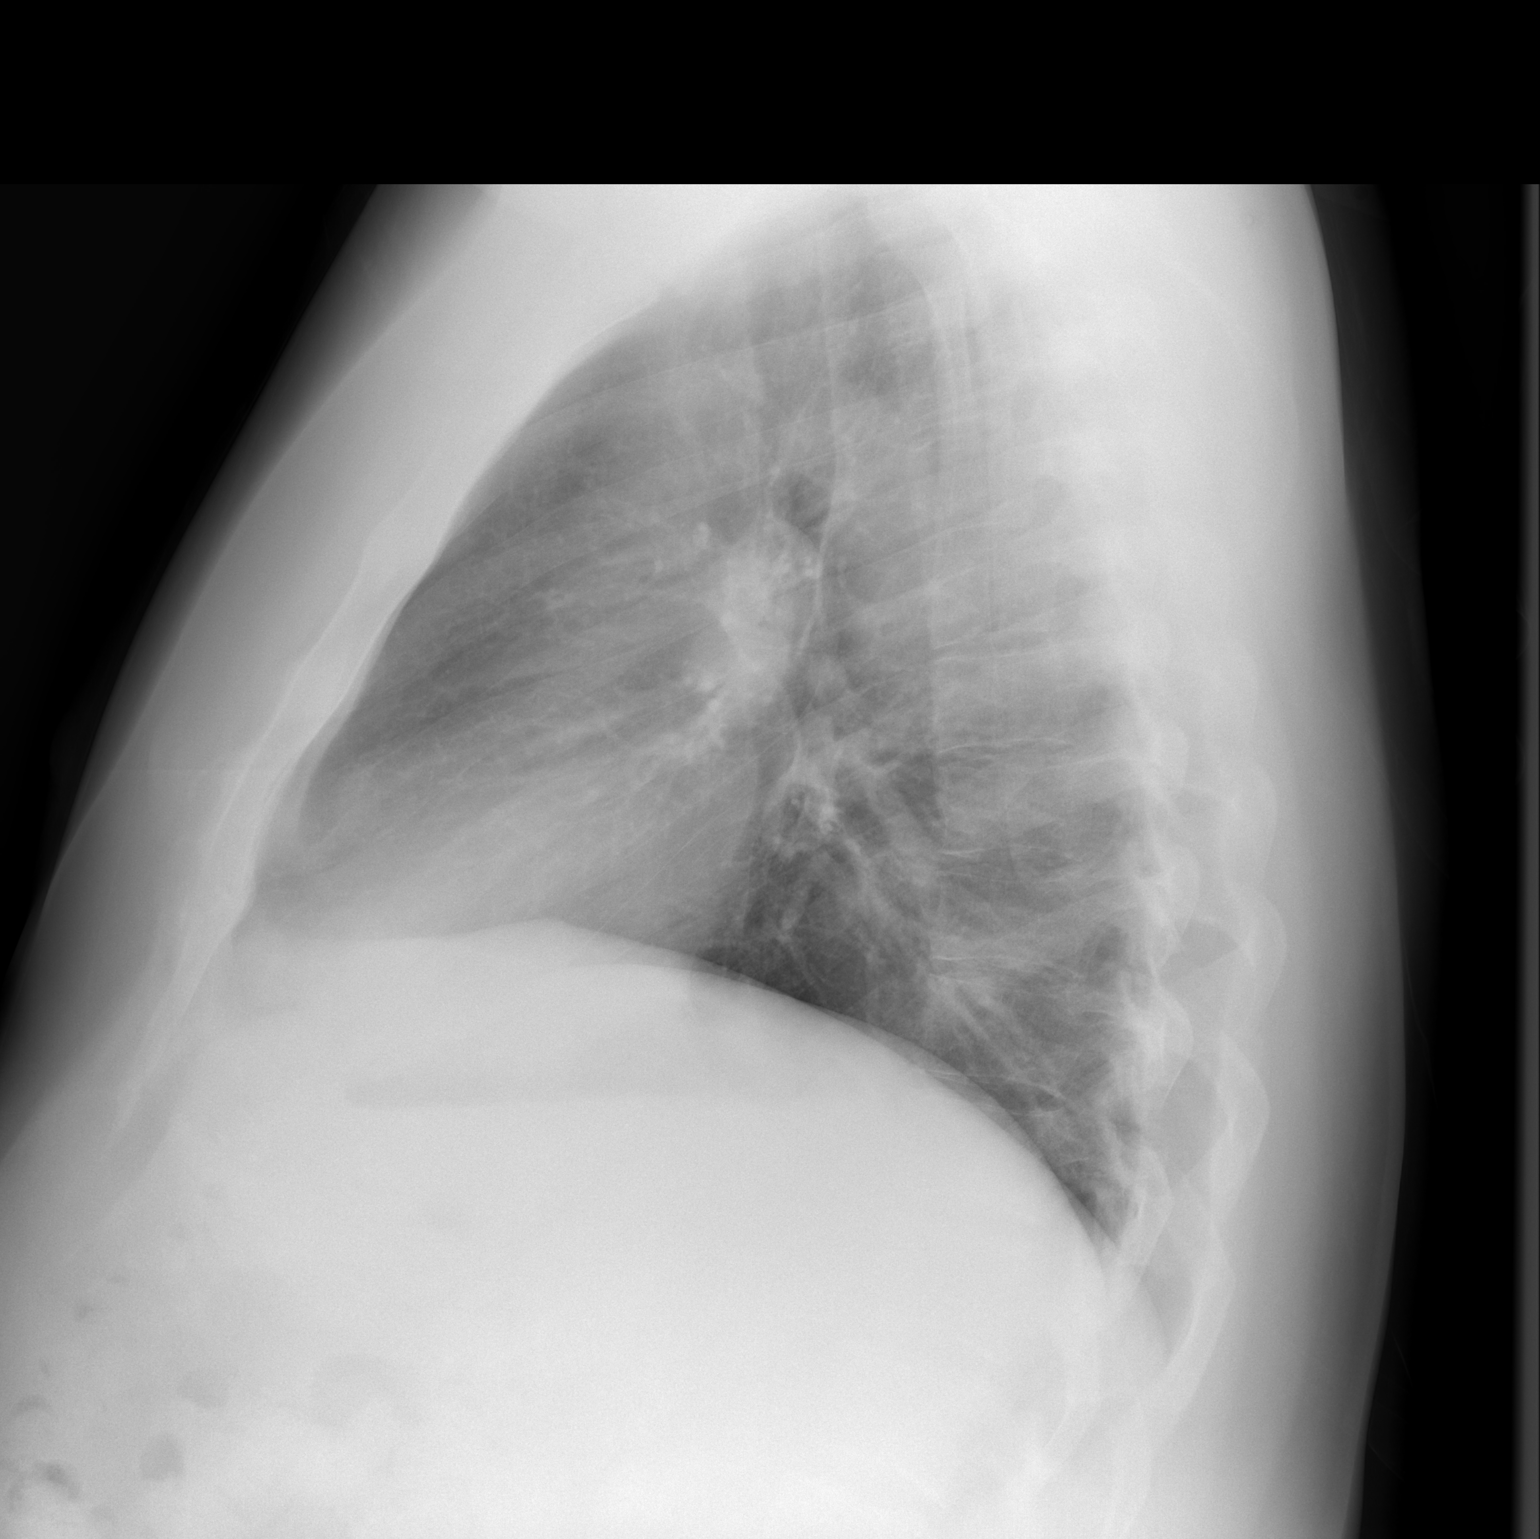

[2 of 2 positions shown; findings below may reference images not displayed]

FINDINGS: Heart size and vascularity are normal.  Lungs are clear
without infiltrate or effusion.  No mass lesion.
IMPRESSION: Negative

## 2013-10-12 IMAGING — RF DG UGI W/ GASTROGRAFIN
15 of 22 series · 15 of 22 positions shown · non-contrast
Comparison: 02/05/2012

CLINICAL DATA: Post gastric band.

UPPER GI SERIES WITH KUB
TECHNIQUE: Routine upper GI series was performed with 20 ml of
[id number].
Fluoroscopy Time: 1.3 minutes

[Series 1: run · 1 of 1 slices shown (1 of 14)]
[im 1/1]
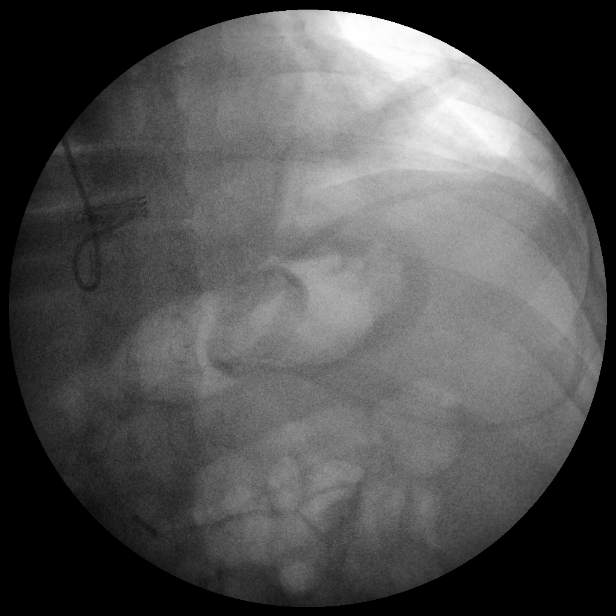

[Series 3: run · 1 of 1 slices shown (2 of 14)]
[im 1/1]
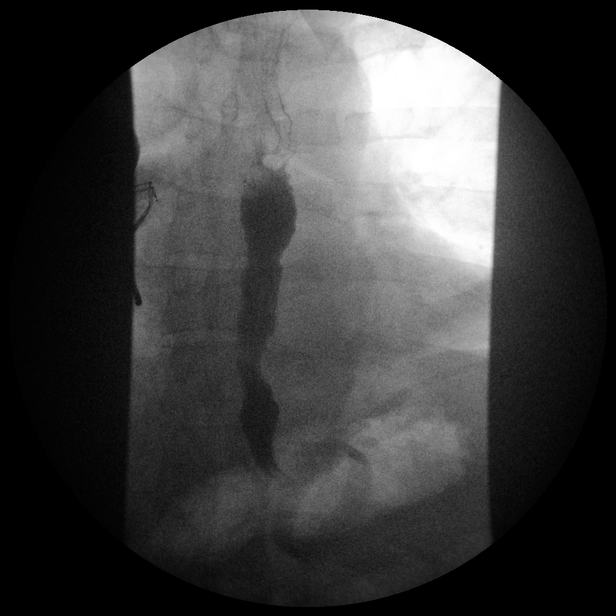

[Series 4: run · 1 of 1 slices shown (3 of 14)]
[im 1/1]
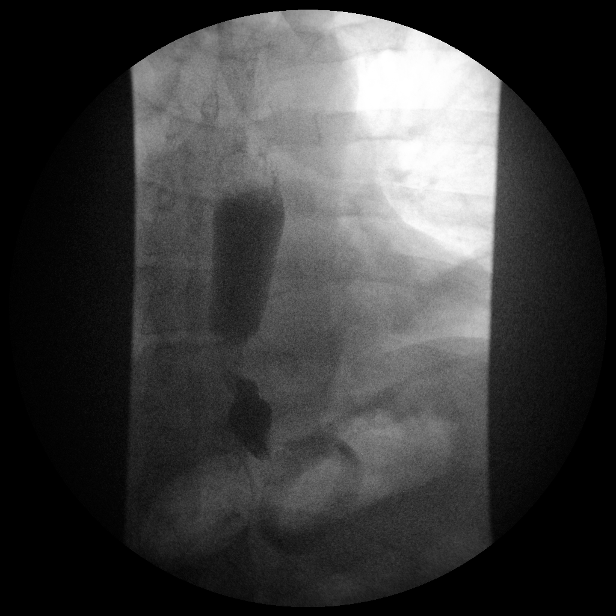

[Series 6: run · 1 of 1 slices shown (4 of 14)]
[im 1/1]
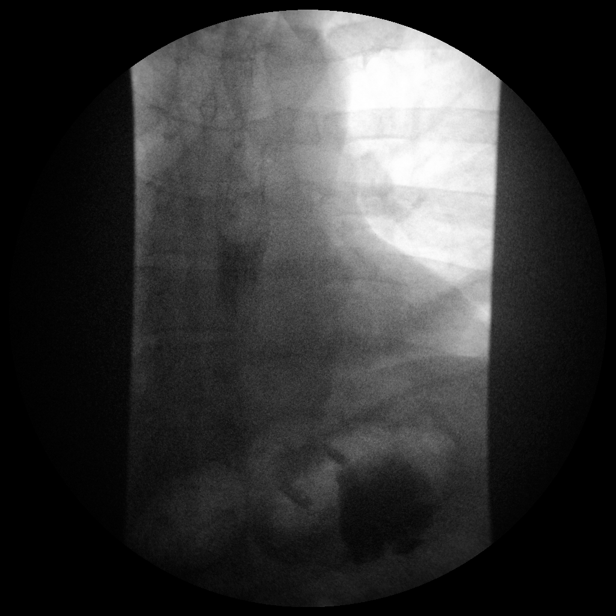

[Series 7: run · 1 of 1 slices shown (5 of 14)]
[im 1/1]
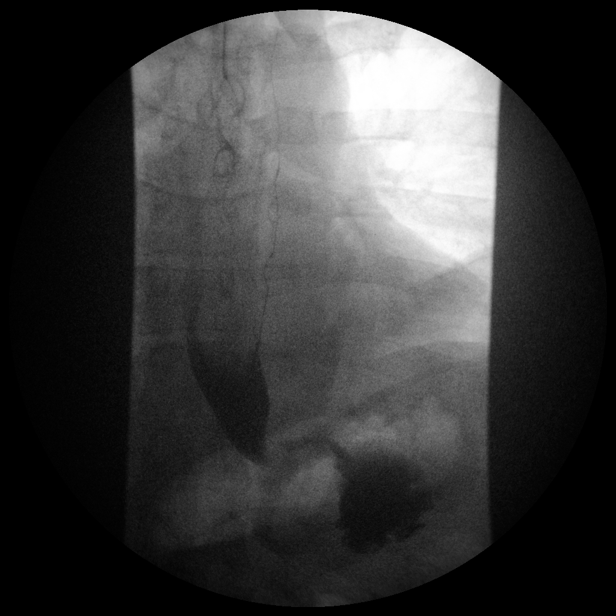

[Series 9: run · 1 of 1 slices shown (6 of 14)]
[im 1/1]
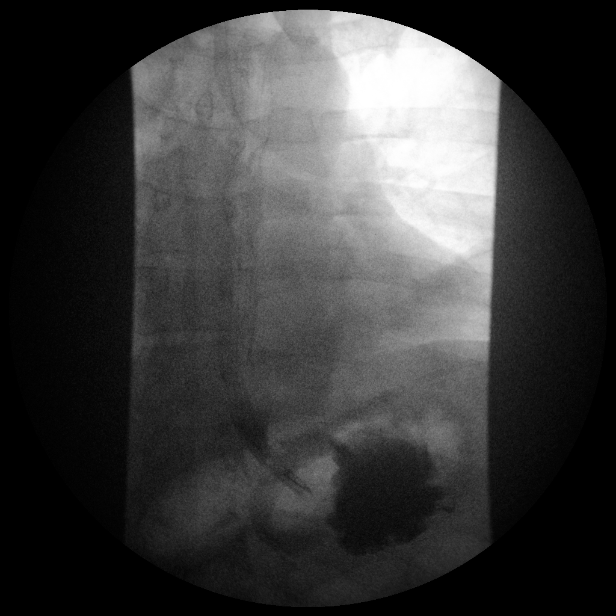

[Series 10: run · 1 of 1 slices shown (7 of 14)]
[im 1/1]
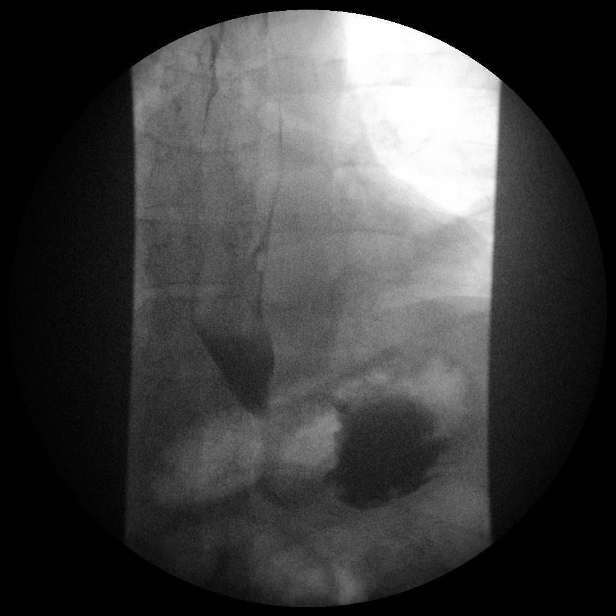

[Series 12: run · 1 of 1 slices shown (8 of 14)]
[im 1/1]
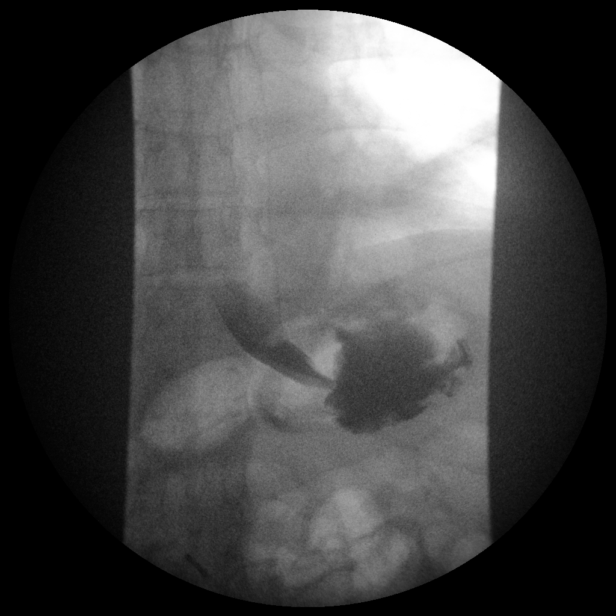

[Series 13: run · 1 of 1 slices shown (9 of 14)]
[im 1/1]
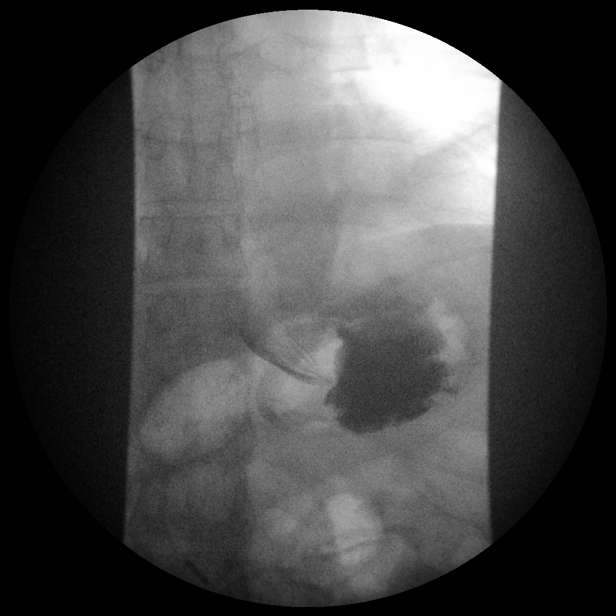

[Series 14: run · 1 of 1 slices shown (10 of 14)]
[im 1/1]
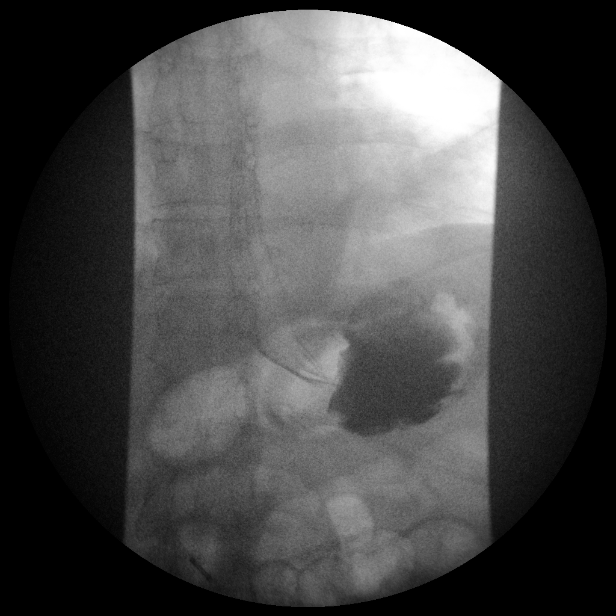

[Series 16: run · 1 of 1 slices shown (11 of 14)]
[im 1/1]
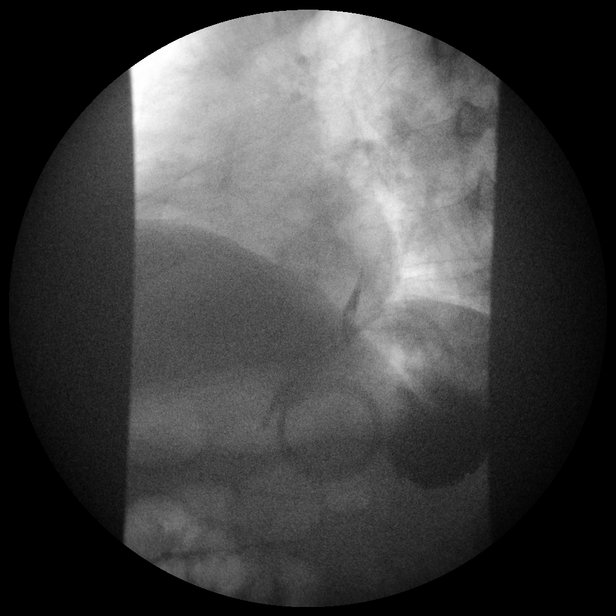

[Series 17: run · 1 of 1 slices shown (12 of 14)]
[im 1/1]
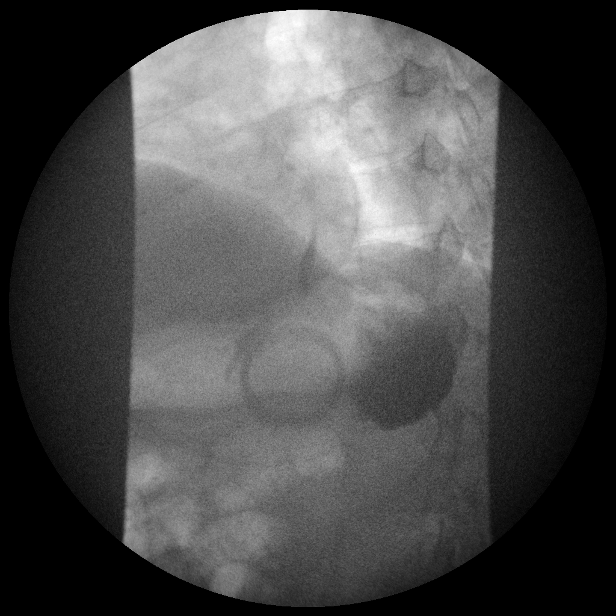

[Series 19: run · 1 of 1 slices shown (13 of 14)]
[im 1/1]
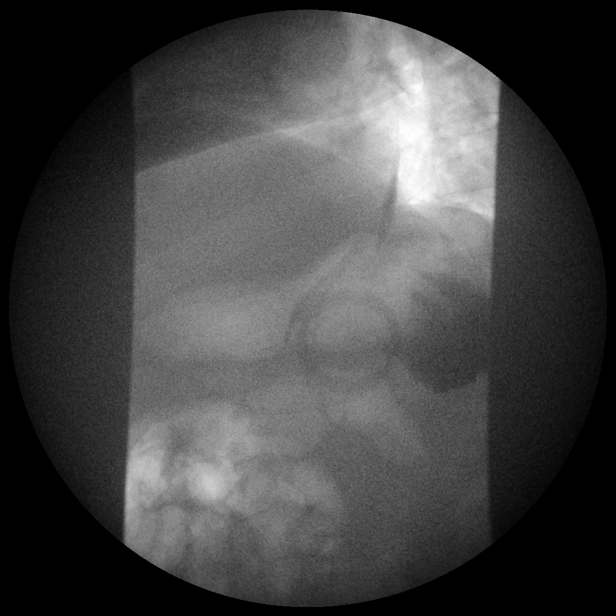

[Series 20: run · 1 of 1 slices shown (14 of 14)]
[im 1/1]
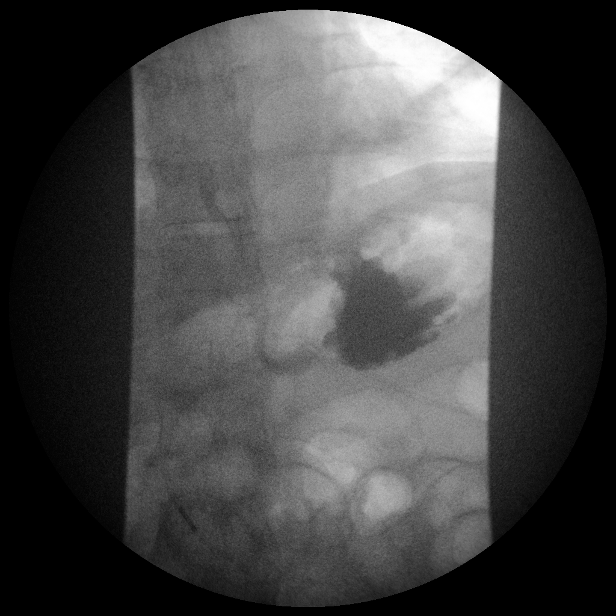

[Series 1002: view not recorded · 0.20mm/px · 1 of 1 slices shown]
[im 1/1]
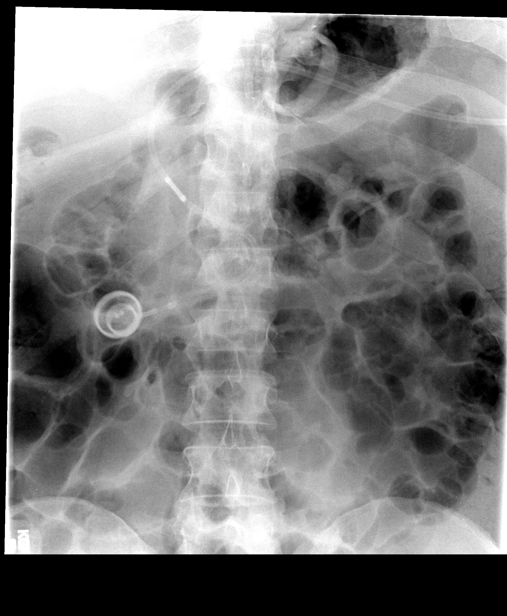

[15 of 22 positions shown; findings below may reference images not displayed]

FINDINGS: Scout image of the abdomen demonstrates a lap band device
in place.  The lab band is in the 8 o'clock to 2 o'clock
orientation in the left upper quadrant.

The initial swallow of Omnipaque demonstrates tertiary contractions
within the esophagus and slow emptying of the esophagus.  This
finally passes through the gastric band device.  Subsequent
swallows demonstrate quicker emptying of the esophagus.  No
evidence of obstruction or leak.
IMPRESSION: No evidence of obstruction or leak post gastric band.

## 2013-11-11 ENCOUNTER — Encounter: Payer: 59 | Admitting: Family Medicine

## 2013-12-16 ENCOUNTER — Telehealth: Payer: Self-pay

## 2013-12-16 NOTE — Telephone Encounter (Signed)
Left message for call back Non-identifiable   Flu- 07/03/13 Td- 09/26/09

## 2013-12-19 ENCOUNTER — Encounter: Payer: Self-pay | Admitting: Family Medicine

## 2013-12-19 ENCOUNTER — Encounter: Payer: Self-pay | Admitting: General Practice

## 2013-12-19 ENCOUNTER — Ambulatory Visit (INDEPENDENT_AMBULATORY_CARE_PROVIDER_SITE_OTHER): Payer: BC Managed Care – PPO | Admitting: Family Medicine

## 2013-12-19 VITALS — BP 120/76 | HR 79 | Temp 98.2°F | Resp 16 | Ht 74.75 in | Wt 245.4 lb

## 2013-12-19 DIAGNOSIS — I10 Essential (primary) hypertension: Secondary | ICD-10-CM

## 2013-12-19 DIAGNOSIS — Z0000 Encounter for general adult medical examination without abnormal findings: Secondary | ICD-10-CM

## 2013-12-19 DIAGNOSIS — Z3009 Encounter for other general counseling and advice on contraception: Secondary | ICD-10-CM

## 2013-12-19 LAB — HEPATIC FUNCTION PANEL
ALT: 29 U/L (ref 0–53)
AST: 30 U/L (ref 0–37)
Albumin: 4.7 g/dL (ref 3.5–5.2)
Alkaline Phosphatase: 55 U/L (ref 39–117)
Bilirubin, Direct: 0 mg/dL (ref 0.0–0.3)
Total Bilirubin: 0.5 mg/dL (ref 0.3–1.2)
Total Protein: 7.2 g/dL (ref 6.0–8.3)

## 2013-12-19 LAB — CBC WITH DIFFERENTIAL/PLATELET
Basophils Absolute: 0 10*3/uL (ref 0.0–0.1)
Basophils Relative: 0.5 % (ref 0.0–3.0)
EOS ABS: 0.1 10*3/uL (ref 0.0–0.7)
EOS PCT: 1.4 % (ref 0.0–5.0)
HEMATOCRIT: 43.5 % (ref 39.0–52.0)
Hemoglobin: 14.6 g/dL (ref 13.0–17.0)
LYMPHS ABS: 1.5 10*3/uL (ref 0.7–4.0)
Lymphocytes Relative: 20.6 % (ref 12.0–46.0)
MCHC: 33.7 g/dL (ref 30.0–36.0)
MCV: 98.5 fl (ref 78.0–100.0)
MONO ABS: 0.4 10*3/uL (ref 0.1–1.0)
Monocytes Relative: 6.2 % (ref 3.0–12.0)
Neutro Abs: 5.1 10*3/uL (ref 1.4–7.7)
Neutrophils Relative %: 71.3 % (ref 43.0–77.0)
PLATELETS: 268 10*3/uL (ref 150.0–400.0)
RBC: 4.41 Mil/uL (ref 4.22–5.81)
RDW: 12.6 % (ref 11.5–14.6)
WBC: 7.2 10*3/uL (ref 4.5–10.5)

## 2013-12-19 LAB — BASIC METABOLIC PANEL
BUN: 11 mg/dL (ref 6–23)
CHLORIDE: 103 meq/L (ref 96–112)
CO2: 26 mEq/L (ref 19–32)
Calcium: 9.4 mg/dL (ref 8.4–10.5)
Creatinine, Ser: 0.8 mg/dL (ref 0.4–1.5)
GFR: 110.31 mL/min (ref >60.00)
GLUCOSE: 71 mg/dL (ref 70–99)
POTASSIUM: 3.8 meq/L (ref 3.5–5.1)
Sodium: 138 mEq/L (ref 135–145)

## 2013-12-19 LAB — LIPID PANEL
CHOLESTEROL: 162 mg/dL (ref 0–200)
HDL: 81.3 mg/dL (ref >39.00)
LDL Cholesterol: 70 mg/dL (ref 0–99)
Total CHOL/HDL Ratio: 2
Triglycerides: 53 mg/dL (ref 0.0–149.0)
VLDL: 10.6 mg/dL (ref 0.0–40.0)

## 2013-12-19 LAB — TSH: TSH: 1.09 u[IU]/mL (ref 0.35–5.50)

## 2013-12-19 NOTE — Progress Notes (Signed)
   Subjective:    Patient ID: Gilbert [name], male    DOB: 07/25/1971, 43 y.o.   MRN: [id number]  HPI CPE- pt continues to lose weight s/p lap band procedure.     Review of Systems Patient reports no vision/hearing changes, anorexia, fever ,adenopathy, persistant/recurrent hoarseness, swallowing issues, chest pain, palpitations, edema, persistant/recurrent cough, hemoptysis, dyspnea (rest,exertional, paroxysmal nocturnal), gastrointestinal  bleeding (melena, rectal bleeding), abdominal pain, excessive heart burn, GU symptoms (dysuria, hematuria, voiding/incontinence issues) syncope, focal weakness, memory loss, numbness & tingling, skin/hair/nail changes, depression, anxiety, abnormal bruising/bleeding, musculoskeletal symptoms/signs.     Objective:   Physical Exam General Appearance:    Alert, cooperative, no distress, appears stated age  Head:    Normocephalic, without obvious abnormality, atraumatic  Eyes:    PERRL, conjunctiva/corneas clear, EOM's intact, fundi    benign, both eyes       Ears:    Normal TM's and external ear canals, both ears  Nose:   Nares normal, septum midline, mucosa normal, no drainage   or sinus tenderness  Throat:   Lips, mucosa, and tongue normal; teeth and gums normal  Neck:   Supple, symmetrical, trachea midline, no adenopathy;       thyroid:  No enlargement/tenderness/nodules  Back:     Symmetric, no curvature, ROM normal, no CVA tenderness  Lungs:     Clear to auscultation bilaterally, respirations unlabored  Chest wall:    No tenderness or deformity  Heart:    Regular rate and rhythm, S1 and S2 normal, no murmur, rub   or gallop  Abdomen:     Soft, non-tender, bowel sounds active all four quadrants,    no masses, no organomegaly  Genitalia:    Deferred to Urology  Rectal:    Deferred due to young age  Extremities:   Extremities normal, atraumatic, no cyanosis or edema  Pulses:   2+ and symmetric all extremities  Skin:   Skin color, texture, turgor  normal, no rashes or lesions  Lymph nodes:   Cervical, supraclavicular, and axillary nodes normal  Neurologic:   CNII-XII intact. Normal strength, sensation and reflexes      throughout          Assessment & Plan:

## 2013-12-19 NOTE — Patient Instructions (Signed)
Follow up in 2 months to recheck BP Decrease to 1 enalapril daily We'll notify you of your lab results and make any changes if needed We'll call you with your urology appt Keep up the good work!  You look great! Call with any questions or concerns Happy Spring!!!

## 2013-12-19 NOTE — Progress Notes (Signed)
Pre visit review using our clinic review tool, if applicable. No additional management support is needed unless otherwise documented below in the visit note. 

## 2013-12-19 NOTE — Assessment & Plan Note (Signed)
Since pt has lost so much weight since Lap Band procedure, will attempt to wean BP meds.  Pt to decrease to 1 tab of enalapril daily and monitor BP closely.

## 2013-12-19 NOTE — Assessment & Plan Note (Signed)
Pt's PE WNL.  Deferred testicular exam to upcoming urology appt.  Check labs.  Anticipatory guidance provided.

## 2013-12-20 ENCOUNTER — Telehealth: Payer: Self-pay | Admitting: Family Medicine

## 2013-12-20 NOTE — Telephone Encounter (Signed)
Relevant patient education assigned to patient using Emmi. ° °

## 2013-12-24 NOTE — Telephone Encounter (Signed)
Unable to reach patient pre visit.  

## 2014-01-21 ENCOUNTER — Telehealth: Payer: Self-pay | Admitting: Family Medicine

## 2014-01-21 NOTE — Telephone Encounter (Signed)
Patient returned called.    Stated that 3 weeks ago, his blood pressure reading was 180/90 at the [facility].  Last Thursday or Friday at a doctor's appointment his blood pressure was closed to 160/90.  This morning at the [facility] his blood pressure was 160/90.  No other changes other than occasional dizziness.    Please advise.

## 2014-01-21 NOTE — Telephone Encounter (Signed)
Increase back to 2 tabs of enalapril daily (this was his previous dose) and change prescription to reflect changes

## 2014-01-21 NOTE — Telephone Encounter (Signed)
Caller name:Carthel Marshell Levan Relation to UE:[id number] Call back [place]:  Reason for call: Patient called and stated that every since dr Birdie Riddle changed his blood pressure medication his BP has been high. His BP this morning was 160/90. Please advise.

## 2014-01-21 NOTE — Telephone Encounter (Signed)
Called patient to further assess his blood pressure readings.  No answer.  Left message for call back.  Please advise.

## 2014-01-21 NOTE — Telephone Encounter (Signed)
Recommendations discussed with patient.  He agreed with plan.  No further questions or concerns voiced.  He is to continue monitoring his blood pressures and to call with further questions or concerns.   Prescription changed to reflect changes.

## 2014-02-04 ENCOUNTER — Telehealth: Payer: Self-pay | Admitting: Family Medicine

## 2014-02-04 NOTE — Telephone Encounter (Signed)
C/o:  At 5:20 this morning patient checked his blood pressure at the gym prior to working out.  BP: 166/104.  He also mentioned a "couple of" episodes of chest pain that occurred during the middle of the night over the past week and half ago.  Described as L sided chest tightness, rated 4/10.  Last episode: Sunday night.  Denies accompanying symptoms such nausea, vomiting, diaphoresis, and shortness of breath.    Denies chest pain at the time of call.    Advice:  Appointment scheduled with Dr. Birdie Riddle on 02/05/14 at [place].  He was also encouraged to call EMS or to go directly to the ER if he starts to experience, chest tightness, n/v, shortness of breath, and diaphoresis.  He stated understanding and agreed with plan.

## 2014-02-04 NOTE — Telephone Encounter (Signed)
Caller name: [place] Relation to pt: self  Call back number:[phone]   Reason for call:   Pt called in stating that with his blood pressure medication change, he is seeing elevated readings.  Latest was 166/90. Pt needs advice

## 2014-02-05 ENCOUNTER — Ambulatory Visit (INDEPENDENT_AMBULATORY_CARE_PROVIDER_SITE_OTHER): Payer: BC Managed Care – PPO | Admitting: Family Medicine

## 2014-02-05 ENCOUNTER — Encounter: Payer: Self-pay | Admitting: Family Medicine

## 2014-02-05 ENCOUNTER — Other Ambulatory Visit: Payer: Self-pay | Admitting: General Practice

## 2014-02-05 VITALS — BP 128/78 | HR 71 | Temp 98.0°F | Resp 16 | Wt 249.1 lb

## 2014-02-05 DIAGNOSIS — R079 Chest pain, unspecified: Secondary | ICD-10-CM

## 2014-02-05 DIAGNOSIS — R200 Anesthesia of skin: Secondary | ICD-10-CM

## 2014-02-05 DIAGNOSIS — R209 Unspecified disturbances of skin sensation: Secondary | ICD-10-CM

## 2014-02-05 DIAGNOSIS — I10 Essential (primary) hypertension: Secondary | ICD-10-CM

## 2014-02-05 HISTORY — DX: Anesthesia of skin: R20.0

## 2014-02-05 LAB — BASIC METABOLIC PANEL
BUN: 15 mg/dL (ref 6–23)
CALCIUM: 9.5 mg/dL (ref 8.4–10.5)
CO2: 27 mEq/L (ref 19–32)
Chloride: 102 mEq/L (ref 96–112)
Creatinine, Ser: 0.9 mg/dL (ref 0.4–1.5)
GFR: 100.18 mL/min (ref >60.00)
Glucose, Bld: 74 mg/dL (ref 70–99)
Potassium: 3.7 mEq/L (ref 3.5–5.1)
SODIUM: 137 meq/L (ref 135–145)

## 2014-02-05 MED ORDER — ENALAPRIL MALEATE 20 MG PO TABS: 40.0000 mg | ORAL_TABLET | Freq: Every day | ORAL | Status: DC

## 2014-02-05 MED ORDER — CYCLOBENZAPRINE HCL 10 MG PO TABS: 10.0000 mg | ORAL_TABLET | Freq: Every day | ORAL | Status: DC

## 2014-02-05 NOTE — Assessment & Plan Note (Signed)
Chronic problem.  Excellent control today.  Asymptomatic today.  Will continue Enalapril at 2 tabs daily and recheck BMP.  Pt expressed understanding and is in agreement w/ plan.

## 2014-02-05 NOTE — Assessment & Plan Note (Signed)
Suspect this is due to anxiety.  Very atypical of cardiac as pt is able to exercise regularly and strenuously w/o any chest pain or SOB.  EKG done- see document for interpretation.  No additional w/u at this time but pt given strict return instructions.  Pt expressed understanding and is in agreement w/ plan.

## 2014-02-05 NOTE — Progress Notes (Signed)
   Subjective:    Patient ID: Gilbert [name], male    DOB: 1971/07/09, 43 y.o.   MRN: [id number]  HPI HTN- chronic problem, at [facility] we decreased Enalapril to 1 tab daily (down from 2).  He reports BP has been high recently, under a lot of stress, not sleeping well.  BP yesterday was 166/104.  Pt reports he has awoke a few times in the past week w/ some chest discomfort- denies feeling similar to previous reflux.  No SOB.  No HAs, visual changes, edema.  L arm numbness- pt denies loss of strength but when driving or other activities will have numbness down L arm.  Denies neck pain.   Review of Systems For ROS see HPI     Objective:   Physical Exam  Vitals reviewed. Constitutional: He is oriented to person, place, and time. He appears well-developed and well-nourished. No distress.  HENT:  Head: Normocephalic and atraumatic.  Eyes: Conjunctivae and EOM are normal. Pupils are equal, round, and reactive to light.  Neck: Normal range of motion. Neck supple. No thyromegaly present.  L trap spasm  Cardiovascular: Normal rate, regular rhythm, normal heart sounds and intact distal pulses.   No murmur heard. Pulmonary/Chest: Effort normal and breath sounds normal. No respiratory distress.  Abdominal: Soft. Bowel sounds are normal. He exhibits no distension.  Musculoskeletal: He exhibits no edema.  Lymphadenopathy:    He has no cervical adenopathy.  Neurological: He is alert and oriented to person, place, and time. He has normal reflexes. No cranial nerve deficit. Coordination normal.  Skin: Skin is warm and dry.  Psychiatric: He has a normal mood and affect. His behavior is normal.          Assessment & Plan:

## 2014-02-05 NOTE — Progress Notes (Signed)
Pre visit review using our clinic review tool, if applicable. No additional management support is needed unless otherwise documented below in the visit note. 

## 2014-02-05 NOTE — Patient Instructions (Signed)
Cancel your upcoming appt- we'll see you back in September for a BP follow up Start the Flexeril nightly for muscle spasm- if the arm numbness is not better in the next 1-2 weeks, call me and we'll send you to ortho Continue the 2 Enalapril daily We'll notify you of your lab results and make any changes if needed Call with any questions or concerns Hang in there!!!

## 2014-02-05 NOTE — Assessment & Plan Note (Signed)
New.  Suspect this is due to pt's very tight trap spasm on L and positional exacerbation.  Start flexeril nightly for spasm relief.  If no improvement in numbness, pt to call for ortho referral.  Pt expressed understanding and is in agreement w/ plan.

## 2014-02-18 ENCOUNTER — Ambulatory Visit: Payer: BC Managed Care – PPO | Admitting: Family Medicine

## 2014-03-09 ENCOUNTER — Ambulatory Visit (INDEPENDENT_AMBULATORY_CARE_PROVIDER_SITE_OTHER): Payer: BC Managed Care – PPO | Admitting: Internal Medicine

## 2014-03-09 ENCOUNTER — Encounter: Payer: Self-pay | Admitting: Internal Medicine

## 2014-03-09 ENCOUNTER — Ambulatory Visit: Payer: BC Managed Care – PPO | Admitting: Family Medicine

## 2014-03-09 VITALS — BP 125/81 | HR 73 | Temp 98.0°F | Wt 249.0 lb

## 2014-03-09 DIAGNOSIS — M25569 Pain in unspecified knee: Secondary | ICD-10-CM

## 2014-03-09 MED ORDER — PREDNISONE 10 MG PO TABS: 10.0000 mg | ORAL_TABLET | Freq: Every day | ORAL | Status: DC

## 2014-03-09 NOTE — Patient Instructions (Signed)
Ice twice a day  prednisone as prescribed  Take OTC tylenol or ibuprofen as needed

## 2014-03-09 NOTE — Progress Notes (Signed)
Subjective:    Patient ID: Gilbert [name], male    DOB: Apr 23, 1971, 43 y.o.   MRN: [id number]  DOS:  03/09/2014 Type of  Visit: acute  History: Went to [place] 2 weeks ago, was very active, since then it has a right knee pain and mild swelling. He did his regular running- mountain biking this weekend and in the last 2 days the pain and swelling are definitely worse.  does not recall any particular injury or fall. Taking motrin as needed    ROS No fever chills No knee locking  phenomena.   Past Medical History  Diagnosis Date  . Hypertension   . Chronic back pain   . Ocular rosacea   . GERD (gastroesophageal reflux disease)   . Morbid obesity   . Hepatitis     unknown type many yrs ago - no residual problems  . Sleep apnea     STOP BANG SCORE 6  . Insomnia     Past Surgical History  Procedure Laterality Date  . Knee surgery      right  . Lasik    . Laparoscopic gastric banding  04/23/2012    Procedure: LAPAROSCOPIC GASTRIC BANDING;  Surgeon: Pedro Earls, MD;  Location: [facility];  Service: General;  Laterality: N/A;  . Hiatal hernia repair  04/23/2012    Procedure: HERNIA REPAIR HIATAL;  Surgeon: Pedro Earls, MD;  Location: [facility];  Service: General;  Laterality: N/A;    History   Social History  . Marital Status: Married    Spouse Name: N/A    Number of Children: N/A  . Years of Education: N/A   Occupational History  . Not on file.   Social History Main Topics  . Smoking status: Never Smoker   . Smokeless tobacco: Never Used  . Alcohol Use: Yes     Comment: social  . Drug Use: No  . Sexual Activity: Not on file   Other Topics Concern  . Not on file   Social History Narrative  . No narrative on file        Medication List       This list is accurate as of: 03/09/14 11:59 PM.  Always use your most recent med list.               cyclobenzaprine 10 MG tablet  Commonly known as:  FLEXERIL  Take 1 tablet (10 mg total) by mouth at  bedtime.     enalapril 20 MG tablet  Commonly known as:  VASOTEC  Take 2 tablets (40 mg total) by mouth daily.     ibuprofen 200 MG tablet  Commonly known as:  ADVIL,MOTRIN  Take 400 mg by mouth every 6 (six) hours as needed. For back pain     MULTI VITAMIN MENS PO  Take by mouth.     predniSONE 10 MG tablet  Commonly known as:  DELTASONE  Take 1 tablet (10 mg total) by mouth daily. 2 tabs a day x 5 days           Objective:   Physical Exam BP 125/81  Pulse 73  Temp(Src) 98 F (36.7 C)  Wt 249 lb (112.946 kg)  SpO2 99%  General -- alert, well-developed, NAD.   Extremities--  no pretibial edema bilaterally  Left knee normal Right knee: Well-healed surgical scar, patella is less mobile, + mild effusion, not read/warm. Range of motion slightly decreased, unable to hyper flex; joint seems stable Calves  symmetric and not tender Neurologic--  alert & oriented X3. Speech normal, gait appropriate for age, strength symmetric and appropriate for age.    Psych-- Cognition and judgment appear intact. Cooperative with normal attention span and concentration. No anxious or depressed appearing.       Assessment & Plan:  Knee pain, Right knee pain, history of previous patellar fracture, status post surgery, had hardware  temporarily. DDX is DJD flare up, bursitis, meniscal tear etc. We agreed on the following: Ice, prednisone, ibuprofen, ortho ref

## 2014-03-09 NOTE — Progress Notes (Signed)
Pre visit review using our clinic review tool, if applicable. No additional management support is needed unless otherwise documented below in the visit note. 

## 2014-04-22 ENCOUNTER — Ambulatory Visit (INDEPENDENT_AMBULATORY_CARE_PROVIDER_SITE_OTHER): Payer: BC Managed Care – PPO | Admitting: Medical

## 2014-04-22 VITALS — BP 120/78 | HR 57 | Temp 98.2°F | Wt 244.0 lb

## 2014-04-22 DIAGNOSIS — R220 Localized swelling, mass and lump, head: Secondary | ICD-10-CM

## 2014-04-22 DIAGNOSIS — R221 Localized swelling, mass and lump, neck: Secondary | ICD-10-CM | POA: Insufficient documentation

## 2014-04-22 DIAGNOSIS — L739 Follicular disorder, unspecified: Secondary | ICD-10-CM

## 2014-04-22 DIAGNOSIS — L678 Other hair color and hair shaft abnormalities: Secondary | ICD-10-CM

## 2014-04-22 DIAGNOSIS — L738 Other specified follicular disorders: Secondary | ICD-10-CM

## 2014-04-22 HISTORY — DX: Localized swelling, mass and lump, neck: R22.1

## 2014-04-22 MED ORDER — DOXYCYCLINE HYCLATE 100 MG PO TABS: 100.0000 mg | ORAL_TABLET | Freq: Two times a day (BID) | ORAL | Status: DC

## 2014-04-22 NOTE — Progress Notes (Signed)
   Subjective:    Patient ID: [name], male    DOB: 07-11-1971, 43 y.o.   MRN: [id number]  HPI  Pt has midline swollen lump under his chin. Present times 2 wks. Not much pain but was sore when he moved his neck. He has had some ingrown hairs on his face recently. And some acne. Pt did make appointment with dermatologist 3 wks from now on August 17th.  Pt expresses fear of lump. He has concern could be serious dx. He is not a smoker.  Review of Systems  Constitutional: Negative for fever, chills and fatigue.  HENT: Negative for congestion, ear pain, facial swelling, sinus pressure, sore throat and voice change.        Midline lump under chin that he can feel when he moves his neck and he states can palpate easily.  Respiratory: Negative for choking, chest tightness, shortness of breath and wheezing.        None reported.  Cardiovascular: Negative for chest pain and palpitations.       None reported.  Skin:       Reports scattered acne under chin over neck.  Neurological: Negative.        None reported.  Hematological: Does not bruise/bleed easily.       Objective:   Physical Exam General  Mental Status - Alert. General Appearance - Well groomed. Not in acute distress.  Skin Rashes-folliculits and acne over anterior neck.  HEENT Head- Normal. Ear Auditory Canal - Left- Normal. Right - Normal.Tympanic Membrane- Left- Normal. Right- Normal. Eye Sclera/Conjunctiva- Left- Normal. Right- Normal. Nose & Sinuses Nasal Mucosa- Left- Not Boggy or Congested. Right- Not Boggy or Congested. Mouth & Throat Lips: Upper Lip- Normal: no dryness, cracking, pallor, cyanosis, or vesicular eruption. Lower Lip-Normal: no dryness, cracking, pallor, cyanosis or vesicular eruption. Buccal Mucosa- Bilateral- No Aphthous ulcers. Oropharynx- No Discharge or Erythema. Tonsils: Characteristics- Bilateral- No Erythema or Congestion. Size/Enlargement- Bilateral- No enlargement. Discharge-  bilateral-None.  Neck Neck- Supple. But under chin on palpation appears to have midline lump about 2 cm in size. No thryomegaly appreciated.   Chest and Lung Exam Auscultation: Breath Sounds:-Normal, even and unlabored.  Cardiovascular Auscultation:Rythm- Regular.  Murmurs & Other Heart Sounds:Ausculatation of the heart reveal- No Murmurs.  Lymphatic Head & Neck General Head & Neck Lymphatics: Bilateral: Description- No Localized lymphadenopathy.(area midline does not feel like lymph node)        Assessment & Plan:

## 2014-04-22 NOTE — Progress Notes (Signed)
Pre visit review using our clinic review tool, if applicable. No additional management support is needed unless otherwise documented below in the visit note. 

## 2014-04-22 NOTE — Assessment & Plan Note (Signed)
Rx doxycycline. RX advisement given. Asked to give  Update in 10 day whether or not antibiotics decreased size of neck  lump.

## 2014-04-22 NOTE — Assessment & Plan Note (Signed)
Will refer pt to ENT. He has concern for serious dx. Due to his expressed concern decided to refer direct to ENT.

## 2014-04-22 NOTE — Patient Instructions (Signed)
Presently you appear to have folliculitis under chin/neck region. I sent doxycycline antibiotic prescription to your pharmacy. For the swollen midline lump under chin will refer you to ENT.(Will attempt to make that appointment within 3-4 weeks but not on same date as your dermatology appointment.) Please give [place] update if antibiotic decreased size of midline lump. Follow up 7-10 days or sooner if needed.

## 2014-05-20 ENCOUNTER — Telehealth: Payer: Self-pay | Admitting: Medical

## 2014-05-20 NOTE — Telephone Encounter (Signed)
Opened erroneously

## 2014-06-05 ENCOUNTER — Ambulatory Visit: Payer: BC Managed Care – PPO | Admitting: Family Medicine

## 2014-09-01 ENCOUNTER — Telehealth: Payer: Self-pay | Admitting: Family Medicine

## 2014-09-01 MED ORDER — ENALAPRIL MALEATE 20 MG PO TABS: 40.0000 mg | ORAL_TABLET | Freq: Every day | ORAL | Status: DC

## 2014-09-01 NOTE — Telephone Encounter (Signed)
Caller name: Dace, Denn Relation to pt: self  Call back number: [phone] Pharmacy: [facility] [phone]  Reason for call:  Pt requesting a refill to hold him over until he's next appointment enalapril (VASOTEC) 20 MG tablet, pt

## 2014-09-01 NOTE — Telephone Encounter (Signed)
Med filled and pt notified.  

## 2014-09-14 ENCOUNTER — Ambulatory Visit: Payer: BC Managed Care – PPO | Admitting: Family Medicine

## 2014-09-16 ENCOUNTER — Encounter: Payer: Self-pay | Admitting: Family Medicine

## 2014-09-16 ENCOUNTER — Encounter: Payer: Self-pay | Admitting: General Practice

## 2014-09-16 ENCOUNTER — Ambulatory Visit (INDEPENDENT_AMBULATORY_CARE_PROVIDER_SITE_OTHER): Payer: BC Managed Care – PPO | Admitting: Family Medicine

## 2014-09-16 VITALS — BP 132/80 | HR 73 | Temp 98.2°F | Resp 16 | Wt 233.8 lb

## 2014-09-16 DIAGNOSIS — J309 Allergic rhinitis, unspecified: Secondary | ICD-10-CM

## 2014-09-16 DIAGNOSIS — S81802A Unspecified open wound, left lower leg, initial encounter: Secondary | ICD-10-CM | POA: Insufficient documentation

## 2014-09-16 DIAGNOSIS — I10 Essential (primary) hypertension: Secondary | ICD-10-CM

## 2014-09-16 DIAGNOSIS — G4700 Insomnia, unspecified: Secondary | ICD-10-CM

## 2014-09-16 DIAGNOSIS — J3089 Other allergic rhinitis: Secondary | ICD-10-CM

## 2014-09-16 HISTORY — DX: Unspecified open wound, left lower leg, initial encounter: S81.802A

## 2014-09-16 HISTORY — DX: Allergic rhinitis, unspecified: J30.9

## 2014-09-16 LAB — BASIC METABOLIC PANEL
BUN: 9 mg/dL (ref 6–23)
CO2: 29 meq/L (ref 19–32)
Calcium: 9.8 mg/dL (ref 8.4–10.5)
Chloride: 100 mEq/L (ref 96–112)
Creatinine, Ser: 0.8 mg/dL (ref 0.4–1.5)
GFR: 106.88 mL/min (ref >60.00)
GLUCOSE: 88 mg/dL (ref 70–99)
POTASSIUM: 3.8 meq/L (ref 3.5–5.1)
Sodium: 137 mEq/L (ref 135–145)

## 2014-09-16 MED ORDER — TRAZODONE HCL 50 MG PO TABS: 25.0000 mg | ORAL_TABLET | Freq: Every evening | ORAL | Status: DC | PRN

## 2014-09-16 NOTE — Progress Notes (Signed)
   Subjective:    Patient ID: [name], male    DOB: [birth year], 43 y.o.   MRN: [id number]  HPI HTN- chronic problem, on Enalapril 2 tabs daily.  Has lost 11 more lbs since July.  Denies CP, SOB, HAs, visual changes, edema.  Insomnia- occurs intermittently, has not slept well for last 2 nights.  Reports increased nasal congestion at night which causes difficulty w/ sleep.  L lower leg wound- fell on coral while on vacation.  1 week later got infected and went to UC and was treated w/ Bactrim.   Review of Systems For ROS see HPI     Objective:   Physical Exam  Constitutional: He is oriented to person, place, and time. He appears well-developed and well-nourished. No distress.  HENT:  Head: Normocephalic and atraumatic.  Eyes: Conjunctivae and EOM are normal. Pupils are equal, round, and reactive to light.  Neck: Normal range of motion. Neck supple. No thyromegaly present.  Cardiovascular: Normal rate, regular rhythm, normal heart sounds and intact distal pulses.   No murmur heard. Pulmonary/Chest: Effort normal and breath sounds normal. No respiratory distress.  Abdominal: Soft. Bowel sounds are normal. He exhibits no distension.  Musculoskeletal: He exhibits no edema.  Lymphadenopathy:    He has no cervical adenopathy.  Neurological: He is alert and oriented to person, place, and time. No cranial nerve deficit.  Skin: Skin is warm and dry.  Scabbed and scaring L lower leg wound along anterior tibia.  No evidence of infxn.  Psychiatric: He has a normal mood and affect. His behavior is normal.  Vitals reviewed.         Assessment & Plan:

## 2014-09-16 NOTE — Progress Notes (Signed)
Pre visit review using our clinic review tool, if applicable. No additional management support is needed unless otherwise documented below in the visit note. 

## 2014-09-16 NOTE — Assessment & Plan Note (Signed)
New.  Pt cut leg on coral which can cause severe infection and lead to gangrene.  Pt was appropriately treated w/ Bactrim.  No evidence of current infxn.  Wound is well healing.  No further intervention needed.

## 2014-09-16 NOTE — Assessment & Plan Note (Signed)
Recurrent problem for pt.  Start trazodone prn.  Reviewed sleep hygiene.  Will follow.

## 2014-09-16 NOTE — Patient Instructions (Signed)
Schedule your complete physical in 6 months We'll notify you of your lab results and make any changes if needed Keep up the good work on healthy diet and regular exercise Start the Trazodone- 1/2 tab to start, increasing to full tab as needed- for insomnia Claritin or Zyrtec daily for the allergy component Leg looks good! Call with any questions or concerns Happy Holidays!!!

## 2014-09-16 NOTE — Assessment & Plan Note (Signed)
Chronic problem.  Well controlled.  Asymptomatic.  Check labs.  No anticipated med changes. 

## 2014-09-16 NOTE — Assessment & Plan Note (Signed)
Chronic problem.  Recommended that pt start OTC antihistamine.  Reviewed supportive care and red flags that should prompt return.  Pt expressed understanding and is in agreement w/ plan.

## 2014-10-19 ENCOUNTER — Other Ambulatory Visit: Payer: Self-pay | Admitting: General Practice

## 2014-10-19 DIAGNOSIS — G4700 Insomnia, unspecified: Secondary | ICD-10-CM

## 2014-10-19 MED ORDER — TRAZODONE HCL 50 MG PO TABS
25.0000 mg | ORAL_TABLET | Freq: Every evening | ORAL | Status: DC | PRN
Start: 2014-10-19 — End: 2015-01-20

## 2014-12-29 ENCOUNTER — Telehealth: Payer: Self-pay | Admitting: Family Medicine

## 2014-12-29 NOTE — Telephone Encounter (Signed)
Pre visit letter sent  °

## 2015-01-19 ENCOUNTER — Telehealth: Payer: Self-pay

## 2015-01-19 NOTE — Telephone Encounter (Signed)
See speciality notes 

## 2015-01-20 ENCOUNTER — Encounter: Payer: Self-pay | Admitting: Family Medicine

## 2015-01-20 ENCOUNTER — Ambulatory Visit (INDEPENDENT_AMBULATORY_CARE_PROVIDER_SITE_OTHER): Payer: BLUE CROSS/BLUE SHIELD | Admitting: Family Medicine

## 2015-01-20 VITALS — BP 122/80 | HR 75 | Temp 98.3°F | Resp 16 | Ht 75.0 in | Wt 241.1 lb

## 2015-01-20 DIAGNOSIS — Z0000 Encounter for general adult medical examination without abnormal findings: Secondary | ICD-10-CM | POA: Diagnosis not present

## 2015-01-20 DIAGNOSIS — G4700 Insomnia, unspecified: Secondary | ICD-10-CM

## 2015-01-20 LAB — HEPATIC FUNCTION PANEL
ALBUMIN: 4.7 g/dL (ref 3.5–5.2)
ALT: 49 U/L (ref 0–53)
AST: 35 U/L (ref 0–37)
Alkaline Phosphatase: 54 U/L (ref 39–117)
Bilirubin, Direct: 0.2 mg/dL (ref 0.0–0.3)
Total Bilirubin: 0.7 mg/dL (ref 0.2–1.2)
Total Protein: 6.6 g/dL (ref 6.0–8.3)

## 2015-01-20 LAB — LIPID PANEL
CHOLESTEROL: 175 mg/dL (ref 0–200)
HDL: 103.5 mg/dL (ref >39.00)
LDL CALC: 61 mg/dL (ref 0–99)
NONHDL: 71.5
TRIGLYCERIDES: 55 mg/dL (ref 0.0–149.0)
Total CHOL/HDL Ratio: 2
VLDL: 11 mg/dL (ref 0.0–40.0)

## 2015-01-20 LAB — BASIC METABOLIC PANEL
BUN: 10 mg/dL (ref 6–23)
CALCIUM: 9.6 mg/dL (ref 8.4–10.5)
CO2: 31 mEq/L (ref 19–32)
CREATININE: 1.37 mg/dL (ref 0.40–1.50)
Chloride: 103 mEq/L (ref 96–112)
GFR: 59.85 mL/min — AB (ref >60.00)
Glucose, Bld: 84 mg/dL (ref 70–99)
Potassium: 4 mEq/L (ref 3.5–5.1)
Sodium: 140 mEq/L (ref 135–145)

## 2015-01-20 LAB — CBC WITH DIFFERENTIAL/PLATELET
Basophils Absolute: 0.1 10*3/uL (ref 0.0–0.1)
Basophils Relative: 0.8 % (ref 0.0–3.0)
EOS ABS: 0.2 10*3/uL (ref 0.0–0.7)
Eosinophils Relative: 2.3 % (ref 0.0–5.0)
HCT: 41.6 % (ref 39.0–52.0)
HEMOGLOBIN: 14.4 g/dL (ref 13.0–17.0)
Lymphocytes Relative: 16.9 % (ref 12.0–46.0)
Lymphs Abs: 1.1 10*3/uL (ref 0.7–4.0)
MCHC: 34.6 g/dL (ref 30.0–36.0)
MCV: 96.5 fl (ref 78.0–100.0)
Monocytes Absolute: 0.5 10*3/uL (ref 0.1–1.0)
Monocytes Relative: 7.6 % (ref 3.0–12.0)
NEUTROS PCT: 72.4 % (ref 43.0–77.0)
Neutro Abs: 4.8 10*3/uL (ref 1.4–7.7)
PLATELETS: 229 10*3/uL (ref 150.0–400.0)
RBC: 4.31 Mil/uL (ref 4.22–5.81)
RDW: 12.9 % (ref 11.5–15.5)
WBC: 6.7 10*3/uL (ref 4.0–10.5)

## 2015-01-20 LAB — TSH: TSH: 1.02 u[IU]/mL (ref 0.35–4.50)

## 2015-01-20 LAB — VITAMIN D 25 HYDROXY (VIT D DEFICIENCY, FRACTURES): VITD: 25.43 ng/mL — AB (ref 30.00–100.00)

## 2015-01-20 MED ORDER — ENALAPRIL MALEATE 20 MG PO TABS: 40.0000 mg | ORAL_TABLET | Freq: Every day | ORAL | Status: DC

## 2015-01-20 MED ORDER — TRAZODONE HCL 50 MG PO TABS: 25.0000 mg | ORAL_TABLET | Freq: Every evening | ORAL | Status: DC | PRN

## 2015-01-20 NOTE — Assessment & Plan Note (Signed)
Pt's PE WNL.  Check labs.  Anticipatory guidance provided.  

## 2015-01-20 NOTE — Patient Instructions (Signed)
Follow up in 6 months to recheck BP We'll notify you of your lab results and make any changes if needed Keep up the good work on healthy diet and regular exercise Call with any questions or concerns Safe [place]!  Enjoy your trip!!

## 2015-01-20 NOTE — Progress Notes (Signed)
Pre visit review using our clinic review tool, if applicable. No additional management support is needed unless otherwise documented below in the visit note. 

## 2015-01-20 NOTE — Progress Notes (Signed)
   Subjective:    Patient ID: Gilbert [name], male    DOB: 07/26/1971, 44 y.o.   MRN: [id number]  HPI CPE- no concerns today.   Review of Systems Patient reports no vision/hearing changes, anorexia, fever ,adenopathy, persistant/recurrent hoarseness, swallowing issues, chest pain, palpitations, edema, persistant/recurrent cough, hemoptysis, dyspnea (rest,exertional, paroxysmal nocturnal), gastrointestinal  bleeding (melena, rectal bleeding), abdominal pain, excessive heart burn, GU symptoms (dysuria, hematuria, voiding/incontinence issues) syncope, focal weakness, memory loss, numbness & tingling, skin/hair/nail changes, depression, anxiety, abnormal bruising/bleeding, musculoskeletal symptoms/signs.     Objective:   Physical Exam General Appearance:    Alert, cooperative, no distress, appears stated age  Head:    Normocephalic, without obvious abnormality, atraumatic  Eyes:    PERRL, conjunctiva/corneas clear, EOM's intact, fundi    benign, both eyes       Ears:    Normal TM's and external ear canals, both ears  Nose:   Nares normal, septum midline, mucosa normal, no drainage   or sinus tenderness  Throat:   Lips, mucosa, and tongue normal; teeth and gums normal  Neck:   Supple, symmetrical, trachea midline, no adenopathy;       thyroid:  No enlargement/tenderness/nodules  Back:     Symmetric, no curvature, ROM normal, no CVA tenderness  Lungs:     Clear to auscultation bilaterally, respirations unlabored  Chest wall:    No tenderness or deformity  Heart:    Regular rate and rhythm, S1 and S2 normal, no murmur, rub   or gallop  Abdomen:     Soft, non-tender, bowel sounds active all four quadrants,    no masses, no organomegaly  Genitalia:    Normal male without lesion, masses,discharge or tenderness  Rectal:    Deferred due to young age  Extremities:   Extremities normal, atraumatic, no cyanosis or edema  Pulses:   2+ and symmetric all extremities  Skin:   Skin color, texture,  turgor normal, no rashes or lesions  Lymph nodes:   Cervical, supraclavicular, and axillary nodes normal  Neurologic:   CNII-XII intact. Normal strength, sensation and reflexes      throughout          Assessment & Plan:

## 2015-02-17 ENCOUNTER — Encounter: Payer: Self-pay | Admitting: Medical

## 2015-02-17 ENCOUNTER — Ambulatory Visit (INDEPENDENT_AMBULATORY_CARE_PROVIDER_SITE_OTHER): Payer: BLUE CROSS/BLUE SHIELD | Admitting: Medical

## 2015-02-17 VITALS — BP 118/80 | HR 74 | Temp 98.4°F | Wt 242.8 lb

## 2015-02-17 DIAGNOSIS — L739 Follicular disorder, unspecified: Secondary | ICD-10-CM | POA: Diagnosis not present

## 2015-02-17 MED ORDER — ONDANSETRON 8 MG PO TBDP: 8.0000 mg | ORAL_TABLET | Freq: Three times a day (TID) | ORAL | Status: DC | PRN

## 2015-02-17 MED ORDER — SULFAMETHOXAZOLE-TRIMETHOPRIM 800-160 MG PO TABS: 1.0000 | ORAL_TABLET | Freq: Two times a day (BID) | ORAL | Status: DC

## 2015-02-17 NOTE — Patient Instructions (Addendum)
Folliculitis Rt axillary area. NO lymph node palpated. Rx bactrim DS. Warm compresses twice daily. If area persists by time back from vacation then would get cbc but past cbc one month ago normal.      Traveling to [place]. Concern for gi illness. Conservative precaution advised. Request zofran. Did give rx of 6 tabs. Use if needed as directed.  Follow up in 10-14 days or as needed  Use probiotics while taking the antibiotic.

## 2015-02-17 NOTE — Assessment & Plan Note (Signed)
Rt axillary area. NO lymph node palpated. Rx bactrim DS. Warm compresses twice daily. If area persists by time back from vacation then would get cbc but past cbc one month ago normal.

## 2015-02-17 NOTE — Progress Notes (Signed)
Subjective:    Patient ID: Gilbert [name], male    DOB: 12-03-1970, 44 y.o.   MRN: [id number]  HPI  Pt in with small area swollen on rt side axillary area. Present for about a week. Area on palpation is mildly tender to palpation. No fever, no chills or sweats.    Recent cbc was normal.    Review of Systems  Constitutional: Negative for fever, chills and fatigue.  HENT: Negative for congestion, ear pain, facial swelling, hearing loss, mouth sores, nosebleeds, postnasal drip, rhinorrhea and sneezing.   Respiratory: Negative for cough, chest tightness, shortness of breath and wheezing.   Cardiovascular: Negative for chest pain and palpitations.  Genitourinary: Negative for urgency, frequency, hematuria, flank pain and penile swelling.  Musculoskeletal: Negative for back pain, joint swelling and arthralgias.  Skin:       rx axillary small red area. Mild indurated.  Neurological: Negative for dizziness, seizures, weakness and headaches.  Hematological: Negative for adenopathy. Does not bruise/bleed easily.  Psychiatric/Behavioral: Negative for hallucinations, confusion and agitation. The patient is not nervous/anxious.       Past Medical History  Diagnosis Date  . Hypertension   . Chronic back pain   . Ocular rosacea   . GERD (gastroesophageal reflux disease)   . Morbid obesity   . Hepatitis     unknown type many yrs ago - no residual problems  . Sleep apnea     STOP BANG SCORE 6  . Insomnia     History   Social History  . Marital Status: Married    Spouse Name: N/A  . Number of Children: N/A  . Years of Education: N/A   Occupational History  . Not on file.   Social History Main Topics  . Smoking status: Never Smoker   . Smokeless tobacco: Never Used  . Alcohol Use: Yes     Comment: social  . Drug Use: No  . Sexual Activity: Not on file   Other Topics Concern  . Not on file   Social History Narrative    Past Surgical History  Procedure Laterality  Date  . Knee surgery      right  . Lasik    . Laparoscopic gastric banding  04/23/2012    Procedure: LAPAROSCOPIC GASTRIC BANDING;  Surgeon: Pedro Earls, MD;  Location: [facility];  Service: General;  Laterality: N/A;  . Hiatal hernia repair  04/23/2012    Procedure: HERNIA REPAIR HIATAL;  Surgeon: Pedro Earls, MD;  Location: [facility];  Service: General;  Laterality: N/A;    Family History  Problem Relation Age of Onset  . Hypertension Father   . Diabetes Father   . Heart disease Father     pacemaker    Allergies  Allergen Reactions  . Penicillins Rash    Current Outpatient Prescriptions on File Prior to Visit  Medication Sig Dispense Refill  . enalapril (VASOTEC) 20 MG tablet Take 2 tablets (40 mg total) by mouth daily. 180 tablet 1  . ibuprofen (ADVIL,MOTRIN) 200 MG tablet Take 400 mg by mouth every 6 (six) hours as needed. For back pain    . traZODone (DESYREL) 50 MG tablet Take 0.5-1 tablets (25-50 mg total) by mouth at bedtime as needed for sleep. 90 tablet 1   No current facility-administered medications on file prior to visit.    BP 118/80 mmHg  Pulse 74  Temp(Src) 98.4 F (36.9 C) (Oral)  Wt 242 lb 12.8 oz (110.133 kg)  SpO2  97%       Objective:   Physical Exam   General- No acute distress. Pleasant patient. Neck- Full range of motion, no jvd Lungs- Clear, even and unlabored. Heart- regular rate and rhythm. Neurologic- CNII- XII grossly intact. Rt axillary area- small red area. Slight induration. Faint tender. Area of redness about 9 mm at most. Lymphatic exam- on palpation no palpable regional lymphadenopathy.    Assessment & Plan:

## 2015-02-17 NOTE — Progress Notes (Signed)
Pre visit review using our clinic review tool, if applicable. No additional management support is needed unless otherwise documented below in the visit note. 

## 2015-07-27 ENCOUNTER — Ambulatory Visit: Payer: BLUE CROSS/BLUE SHIELD | Admitting: Family Medicine

## 2015-08-17 ENCOUNTER — Ambulatory Visit (INDEPENDENT_AMBULATORY_CARE_PROVIDER_SITE_OTHER): Payer: BLUE CROSS/BLUE SHIELD | Admitting: Family Medicine

## 2015-08-17 ENCOUNTER — Encounter: Payer: Self-pay | Admitting: Family Medicine

## 2015-08-17 VITALS — BP 120/80 | HR 76 | Temp 98.0°F | Resp 16 | Ht 75.0 in | Wt 233.1 lb

## 2015-08-17 DIAGNOSIS — I10 Essential (primary) hypertension: Secondary | ICD-10-CM

## 2015-08-17 LAB — BASIC METABOLIC PANEL
BUN: 9 mg/dL (ref 6–23)
CHLORIDE: 101 meq/L (ref 96–112)
CO2: 29 meq/L (ref 19–32)
Calcium: 9.5 mg/dL (ref 8.4–10.5)
Creatinine, Ser: 0.78 mg/dL (ref 0.40–1.50)
GFR: 114.34 mL/min (ref >60.00)
GLUCOSE: 79 mg/dL (ref 70–99)
POTASSIUM: 4.2 meq/L (ref 3.5–5.1)
SODIUM: 141 meq/L (ref 135–145)

## 2015-08-17 LAB — CBC WITH DIFFERENTIAL/PLATELET
BASOS PCT: 1.1 % (ref 0.0–3.0)
Basophils Absolute: 0 10*3/uL (ref 0.0–0.1)
Eosinophils Absolute: 0.1 10*3/uL (ref 0.0–0.7)
Eosinophils Relative: 3.1 % (ref 0.0–5.0)
HCT: 43.7 % (ref 39.0–52.0)
Hemoglobin: 14.7 g/dL (ref 13.0–17.0)
LYMPHS ABS: 1.3 10*3/uL (ref 0.7–4.0)
Lymphocytes Relative: 27.5 % (ref 12.0–46.0)
MCHC: 33.6 g/dL (ref 30.0–36.0)
MCV: 100.4 fl — AB (ref 78.0–100.0)
MONOS PCT: 9.1 % (ref 3.0–12.0)
Monocytes Absolute: 0.4 10*3/uL (ref 0.1–1.0)
NEUTROS ABS: 2.7 10*3/uL (ref 1.4–7.7)
Neutrophils Relative %: 59.2 % (ref 43.0–77.0)
PLATELETS: 247 10*3/uL (ref 150.0–400.0)
RBC: 4.35 Mil/uL (ref 4.22–5.81)
RDW: 13.3 % (ref 11.5–15.5)
WBC: 4.6 10*3/uL (ref 4.0–10.5)

## 2015-08-17 NOTE — Progress Notes (Signed)
   Subjective:    Patient ID: WALKER NEUMAN, male    DOB: May 24, 1971, 44 y.o.   MRN: [id number]  HPI HTN- chronic problem, on Enalapril w/ excellent control.  Pt has lost another 10 lbs since last visit.  Doing boot camp regularly, has completely changed diet.  No CP, SOB, HAs, visual changes, edema.   Review of Systems For ROS see HPI     Objective:   Physical Exam  Constitutional: He is oriented to person, place, and time. He appears well-developed and well-nourished. No distress.  HENT:  Head: Normocephalic and atraumatic.  Eyes: Conjunctivae and EOM are normal. Pupils are equal, round, and reactive to light.  Neck: Normal range of motion. Neck supple. No thyromegaly present.  Cardiovascular: Normal rate, regular rhythm, normal heart sounds and intact distal pulses.   No murmur heard. Pulmonary/Chest: Effort normal and breath sounds normal. No respiratory distress.  Abdominal: Soft. Bowel sounds are normal. He exhibits no distension.  Musculoskeletal: He exhibits no edema.  Lymphadenopathy:    He has no cervical adenopathy.  Neurological: He is alert and oriented to person, place, and time. No cranial nerve deficit.  Skin: Skin is warm and dry.  Psychiatric: He has a normal mood and affect. His behavior is normal.  Vitals reviewed.         Assessment & Plan:

## 2015-08-17 NOTE — Patient Instructions (Signed)
Schedule your complete physical in 6 months We'll notify you of your lab results and make any changes if needed Keep up the good work on healthy diet and regular exercise- you look great! Call with any questions or concerns If you want to join [place] at the new [place] office, any scheduled appointments will automatically transfer and we will see you at [address] [place] [address] [name],  [phone] (OPENING 09/28/15) Happy Holidays!!!

## 2015-08-17 NOTE — Progress Notes (Signed)
Pre visit review using our clinic review tool, if applicable. No additional management support is needed unless otherwise documented below in the visit note. 

## 2015-08-17 NOTE — Assessment & Plan Note (Signed)
Chronic problem.  Well controlled on current medication.  Applauded his ongoing weight loss- down another 10 lbs.  Check labs.  No anticipated med changes.

## 2015-08-18 ENCOUNTER — Encounter: Payer: Self-pay | Admitting: General Practice

## 2015-08-28 ENCOUNTER — Other Ambulatory Visit: Payer: Self-pay | Admitting: Family Medicine

## 2015-08-28 NOTE — Telephone Encounter (Signed)
Medication filled to pharmacy as requested.   

## 2016-01-27 ENCOUNTER — Ambulatory Visit (INDEPENDENT_AMBULATORY_CARE_PROVIDER_SITE_OTHER): Payer: BLUE CROSS/BLUE SHIELD | Admitting: Family Medicine

## 2016-01-27 ENCOUNTER — Encounter: Payer: Self-pay | Admitting: General Practice

## 2016-01-27 ENCOUNTER — Encounter: Payer: Self-pay | Admitting: Family Medicine

## 2016-01-27 VITALS — BP 126/82 | HR 76 | Temp 98.0°F | Resp 16 | Ht 75.0 in | Wt 242.0 lb

## 2016-01-27 DIAGNOSIS — Z0000 Encounter for general adult medical examination without abnormal findings: Secondary | ICD-10-CM

## 2016-01-27 LAB — BASIC METABOLIC PANEL
BUN: 12 mg/dL (ref 6–23)
CO2: 29 mEq/L (ref 19–32)
Calcium: 9.6 mg/dL (ref 8.4–10.5)
Chloride: 101 mEq/L (ref 96–112)
Creatinine, Ser: 0.9 mg/dL (ref 0.40–1.50)
GFR: 96.74 mL/min (ref >60.00)
Glucose, Bld: 64 mg/dL — ABNORMAL LOW (ref 70–99)
Potassium: 4.1 mEq/L (ref 3.5–5.1)
SODIUM: 141 meq/L (ref 135–145)

## 2016-01-27 LAB — CBC WITH DIFFERENTIAL/PLATELET
BASOS PCT: 0.5 % (ref 0.0–3.0)
Basophils Absolute: 0 10*3/uL (ref 0.0–0.1)
EOS PCT: 0.6 % (ref 0.0–5.0)
Eosinophils Absolute: 0.1 10*3/uL (ref 0.0–0.7)
HCT: 42.8 % (ref 39.0–52.0)
HEMOGLOBIN: 14.3 g/dL (ref 13.0–17.0)
LYMPHS ABS: 1.1 10*3/uL (ref 0.7–4.0)
Lymphocytes Relative: 12.4 % (ref 12.0–46.0)
MCHC: 33.5 g/dL (ref 30.0–36.0)
MCV: 99 fl (ref 78.0–100.0)
MONO ABS: 0.6 10*3/uL (ref 0.1–1.0)
Monocytes Relative: 6.6 % (ref 3.0–12.0)
Neutro Abs: 6.9 10*3/uL (ref 1.4–7.7)
Neutrophils Relative %: 79.9 % — ABNORMAL HIGH (ref 43.0–77.0)
Platelets: 234 10*3/uL (ref 150.0–400.0)
RBC: 4.32 Mil/uL (ref 4.22–5.81)
RDW: 12.9 % (ref 11.5–15.5)
WBC: 8.6 10*3/uL (ref 4.0–10.5)

## 2016-01-27 LAB — LIPID PANEL
CHOL/HDL RATIO: 2
Cholesterol: 202 mg/dL — ABNORMAL HIGH (ref 0–200)
HDL: 105.4 mg/dL (ref >39.00)
LDL CALC: 85 mg/dL (ref 0–99)
NonHDL: 96.86
TRIGLYCERIDES: 59 mg/dL (ref 0.0–149.0)
VLDL: 11.8 mg/dL (ref 0.0–40.0)

## 2016-01-27 LAB — HEPATIC FUNCTION PANEL
ALK PHOS: 59 U/L (ref 39–117)
ALT: 17 U/L (ref 0–53)
AST: 24 U/L (ref 0–37)
Albumin: 4.8 g/dL (ref 3.5–5.2)
BILIRUBIN TOTAL: 0.6 mg/dL (ref 0.2–1.2)
Bilirubin, Direct: 0.1 mg/dL (ref 0.0–0.3)
Total Protein: 6.9 g/dL (ref 6.0–8.3)

## 2016-01-27 LAB — TSH: TSH: 1.51 u[IU]/mL (ref 0.35–4.50)

## 2016-01-27 LAB — PSA: PSA: 0.71 ng/mL (ref 0.10–4.00)

## 2016-01-27 NOTE — Progress Notes (Signed)
   Subjective:    Patient ID: Gilbert [name], male    DOB: 05-Jun-1971, 45 y.o.   MRN: [id number]  HPI CPE- no concerns today.   Review of Systems Patient reports no vision/hearing changes, anorexia, fever ,adenopathy, persistant/recurrent hoarseness, swallowing issues, chest pain, palpitations, edema, persistant/recurrent cough, hemoptysis, dyspnea (rest,exertional, paroxysmal nocturnal), gastrointestinal  bleeding (melena, rectal bleeding), abdominal pain, excessive heart burn, GU symptoms (dysuria, hematuria, voiding/incontinence issues) syncope, focal weakness, memory loss, numbness & tingling, skin/hair/nail changes, depression, anxiety, abnormal bruising/bleeding, musculoskeletal symptoms/signs.     Objective:   Physical Exam General Appearance:    Alert, cooperative, no distress, appears stated age, obese  Head:    Normocephalic, without obvious abnormality, atraumatic  Eyes:    PERRL, conjunctiva/corneas clear, EOM's intact, fundi    benign, both eyes       Ears:    Normal TM's and external ear canals, both ears  Nose:   Nares normal, septum midline, mucosa normal, no drainage   or sinus tenderness  Throat:   Lips, mucosa, and tongue normal; teeth and gums normal  Neck:   Supple, symmetrical, trachea midline, no adenopathy;       thyroid:  No enlargement/tenderness/nodules  Back:     Symmetric, no curvature, ROM normal, no CVA tenderness  Lungs:     Clear to auscultation bilaterally, respirations unlabored  Chest wall:    No tenderness or deformity  Heart:    Regular rate and rhythm, S1 and S2 normal, no murmur, rub   or gallop  Abdomen:     Soft, non-tender, bowel sounds active all four quadrants,    no masses, no organomegaly  Genitalia:    Normal male without lesion, masses,discharge or tenderness  Rectal:    Deferred due to young age  Extremities:   Extremities normal, atraumatic, no cyanosis or edema  Pulses:   2+ and symmetric all extremities  Skin:   Skin color,  texture, turgor normal, no rashes or lesions  Lymph nodes:   Cervical, supraclavicular, and axillary nodes normal  Neurologic:   CNII-XII intact. Normal strength, sensation and reflexes      throughout          Assessment & Plan:

## 2016-01-27 NOTE — Progress Notes (Signed)
Pre visit review using our clinic review tool, if applicable. No additional management support is needed unless otherwise documented below in the visit note. 

## 2016-01-27 NOTE — Patient Instructions (Signed)
Follow up in 6 months to recheck BP We'll notify you of your lab results and make any changes if needed Continue to work on healthy diet and regular exercise- you look great! Call with any questions or concerns Thanks for sticking with [place]! Have a great summer!!!

## 2016-01-27 NOTE — Assessment & Plan Note (Signed)
Pt's PE WNL w/ exception of obesity.  Check labs.  Anticipatory guidance provided.  

## 2016-04-17 ENCOUNTER — Other Ambulatory Visit: Payer: Self-pay | Admitting: General Practice

## 2016-04-17 MED ORDER — ENALAPRIL MALEATE 20 MG PO TABS: 40.0000 mg | ORAL_TABLET | Freq: Every day | ORAL | 1 refills | Status: DC

## 2016-07-11 ENCOUNTER — Encounter (HOSPITAL_COMMUNITY): Payer: Self-pay

## 2016-08-03 ENCOUNTER — Ambulatory Visit: Payer: Self-pay | Admitting: Family Medicine

## 2016-08-04 ENCOUNTER — Encounter: Payer: Self-pay | Admitting: Family Medicine

## 2016-08-04 ENCOUNTER — Ambulatory Visit (INDEPENDENT_AMBULATORY_CARE_PROVIDER_SITE_OTHER): Payer: BLUE CROSS/BLUE SHIELD | Admitting: Family Medicine

## 2016-08-04 VITALS — BP 122/72 | HR 68 | Temp 98.4°F | Resp 16 | Ht 75.0 in | Wt 232.5 lb

## 2016-08-04 DIAGNOSIS — I10 Essential (primary) hypertension: Secondary | ICD-10-CM

## 2016-08-04 DIAGNOSIS — G4700 Insomnia, unspecified: Secondary | ICD-10-CM | POA: Diagnosis not present

## 2016-08-04 DIAGNOSIS — Z23 Encounter for immunization: Secondary | ICD-10-CM

## 2016-08-04 LAB — CBC WITH DIFFERENTIAL/PLATELET
BASOS PCT: 0.7 % (ref 0.0–3.0)
Basophils Absolute: 0.1 10*3/uL (ref 0.0–0.1)
EOS PCT: 2.1 % (ref 0.0–5.0)
Eosinophils Absolute: 0.2 10*3/uL (ref 0.0–0.7)
HEMATOCRIT: 44.5 % (ref 39.0–52.0)
HEMOGLOBIN: 15.2 g/dL (ref 13.0–17.0)
LYMPHS PCT: 18.7 % (ref 12.0–46.0)
Lymphs Abs: 1.4 10*3/uL (ref 0.7–4.0)
MCHC: 34.1 g/dL (ref 30.0–36.0)
MCV: 98.9 fl (ref 78.0–100.0)
MONOS PCT: 9.6 % (ref 3.0–12.0)
Monocytes Absolute: 0.7 10*3/uL (ref 0.1–1.0)
Neutro Abs: 5.2 10*3/uL (ref 1.4–7.7)
Neutrophils Relative %: 68.9 % (ref 43.0–77.0)
Platelets: 263 10*3/uL (ref 150.0–400.0)
RBC: 4.5 Mil/uL (ref 4.22–5.81)
RDW: 12.7 % (ref 11.5–15.5)
WBC: 7.5 10*3/uL (ref 4.0–10.5)

## 2016-08-04 LAB — BASIC METABOLIC PANEL
BUN: 14 mg/dL (ref 6–23)
CO2: 32 mEq/L (ref 19–32)
Calcium: 10 mg/dL (ref 8.4–10.5)
Chloride: 103 mEq/L (ref 96–112)
Creatinine, Ser: 0.86 mg/dL (ref 0.40–1.50)
GFR: 101.72 mL/min (ref >60.00)
Glucose, Bld: 71 mg/dL (ref 70–99)
POTASSIUM: 5.2 meq/L — AB (ref 3.5–5.1)
Sodium: 142 mEq/L (ref 135–145)

## 2016-08-04 MED ORDER — SUVOREXANT 15 MG PO TABS: 1.0000 | ORAL_TABLET | Freq: Every day | ORAL | 3 refills | Status: DC

## 2016-08-04 NOTE — Assessment & Plan Note (Signed)
Chronic problem, excellent control today.  Asymptomatic.  Applauded his 10 lb weight loss.  Check labs.  No anticipated med changes.  Will follow.

## 2016-08-04 NOTE — Assessment & Plan Note (Signed)
Deteriorated.  Trazodone is no longer working due to increased work stress.  Reviewed sleep hygiene.  Will switch to Belsomra for symptom improvement.  Will follow closely.

## 2016-08-04 NOTE — Progress Notes (Signed)
   Subjective:    Patient ID: [name], male    DOB: 1971-04-16, 45 y.o.   MRN: [id number]  HPI HTN- chronic problem, well controlled today on Enalapril 40mg  daily.  Pt has lost another 10 lbs since last visit.  Exercising regularly.  No CP, SOB, HAs, visual changes, edema.  Insomnia- trazodone is no longer working.  Took Nyquil last night w/o improvement.  Trouble both falling and staying asleep.    Review of Systems For ROS see HPI     Objective:   Physical Exam  Constitutional: He is oriented to person, place, and time. He appears well-developed and well-nourished. No distress.  HENT:  Head: Normocephalic and atraumatic.  Eyes: Conjunctivae and EOM are normal. Pupils are equal, round, and reactive to light.  Neck: Normal range of motion. Neck supple. No thyromegaly present.  Cardiovascular: Normal rate, regular rhythm, normal heart sounds and intact distal pulses.   No murmur heard. Pulmonary/Chest: Effort normal and breath sounds normal. No respiratory distress.  Abdominal: Soft. Bowel sounds are normal. He exhibits no distension.  Musculoskeletal: He exhibits no edema.  Lymphadenopathy:    He has no cervical adenopathy.  Neurological: He is alert and oriented to person, place, and time. No cranial nerve deficit.  Skin: Skin is warm and dry.  Psychiatric: He has a normal mood and affect. His behavior is normal.  Vitals reviewed.         Assessment & Plan:

## 2016-08-04 NOTE — Patient Instructions (Signed)
Schedule your complete physical in 6 months We'll notify you of your lab results and make any changes if needed Continue to work on healthy diet and regular exercise- you look great! Start the Belsomra nightly for insomnia (do NOT take this in conjunction with the Trazodone) Call with any questions or concerns Happy Holidays!!!

## 2016-08-04 NOTE — Progress Notes (Signed)
Pre visit review using our clinic review tool, if applicable. No additional management support is needed unless otherwise documented below in the visit note. 

## 2016-08-07 ENCOUNTER — Encounter: Payer: Self-pay | Admitting: General Practice

## 2016-08-10 ENCOUNTER — Telehealth: Payer: Self-pay | Admitting: Family Medicine

## 2016-08-10 NOTE — Telephone Encounter (Signed)
Did you receive anything on this?

## 2016-08-10 NOTE — Telephone Encounter (Signed)
PA started thru Cover My Meds

## 2016-08-10 NOTE — Telephone Encounter (Signed)
Pt calling checking status on the pre auth for belsomra.

## 2016-08-10 NOTE — Telephone Encounter (Signed)
Requested the PA from the pharmacy and they are faxing over to initiate PA.

## 2016-08-11 NOTE — Telephone Encounter (Signed)
PA approved and paperwork sent to pharmacy for rx refill.

## 2016-11-04 ENCOUNTER — Other Ambulatory Visit: Payer: Self-pay | Admitting: Family Medicine

## 2017-02-06 ENCOUNTER — Encounter: Payer: BLUE CROSS/BLUE SHIELD | Admitting: Family Medicine

## 2017-05-31 ENCOUNTER — Encounter: Payer: BLUE CROSS/BLUE SHIELD | Admitting: Family Medicine

## 2017-06-07 ENCOUNTER — Other Ambulatory Visit: Payer: Self-pay | Admitting: Family Medicine

## 2017-09-03 ENCOUNTER — Encounter: Payer: BLUE CROSS/BLUE SHIELD | Admitting: Family Medicine

## 2017-09-09 NOTE — Progress Notes (Signed)
Patient presents to clinic today for annual exam.  Patient is fasting for labs.  Diet -- Well-balanced overall. Could make better choices per patient but is working hard on this. Exercise -- Is going to the gym several times per week.   Body mass index is 30.75 kg/m.  Chronic Issues: Hypertension -- Patient currently taking Enalapril daily. Denies side effect of medication. Patient denies chest pain, palpitations, lightheadedness, dizziness, vision changes or frequent headaches.  BP Readings from Last 3 Encounters:  09/10/17 120/82  08/04/16 122/72  01/27/16 126/82   Patient with history of low testosterone, previously on depotestosterone. Has not taken in a while. Noting weight gain and fatigue. Would like levels rechecked today.   Health Maintenance: Immunizations -- Tetanus up-to-date. Flu shot today.  Past Medical History:  Diagnosis Date  . Chronic back pain   . GERD (gastroesophageal reflux disease)   . Hepatitis    unknown type many yrs ago - no residual problems  . Hypertension   . Insomnia   . Morbid obesity ([place])   . Ocular rosacea   . Sleep apnea    STOP BANG SCORE 6    Past Surgical History:  Procedure Laterality Date  . HIATAL HERNIA REPAIR  04/23/2012   Procedure: HERNIA REPAIR HIATAL;  Surgeon: Pedro Earls, MD;  Location: [facility];  Service: General;  Laterality: N/A;  . KNEE SURGERY     right  . LAPAROSCOPIC GASTRIC BANDING  04/23/2012   Procedure: LAPAROSCOPIC GASTRIC BANDING;  Surgeon: Pedro Earls, MD;  Location: [facility];  Service: General;  Laterality: N/A;  . LASIK      Current Outpatient Medications on File Prior to Visit  Medication Sig Dispense Refill  . enalapril (VASOTEC) 20 MG tablet TAKE 2 TABLETS (40 MG TOTAL) BY MOUTH DAILY. 180 tablet 1  . ibuprofen (ADVIL,MOTRIN) 200 MG tablet Take 400 mg by mouth every 6 (six) hours as needed. For back pain     No current facility-administered medications on file prior to visit.      Allergies  Allergen Reactions  . Penicillins Rash    Family History  Problem Relation Age of Onset  . Hypertension Father   . Diabetes Father   . Heart disease Father        pacemaker    Social History   Socioeconomic History  . Marital status: Married    Spouse name: Not on file  . Number of children: Not on file  . Years of education: Not on file  . Highest education level: Not on file  Social Needs  . Financial resource strain: Not on file  . Food insecurity - worry: Not on file  . Food insecurity - inability: Not on file  . Transportation needs - medical: Not on file  . Transportation needs - non-medical: Not on file  Occupational History  . Not on file  Tobacco Use  . Smoking status: Never Smoker  . Smokeless tobacco: Never Used  Substance and Sexual Activity  . Alcohol use: Yes    Comment: social  . Drug use: No  . Sexual activity: Not on file  Other Topics Concern  . Not on file  Social History Narrative  . Not on file    Review of Systems  Constitutional: Negative for fever and weight loss.  HENT: Negative for ear discharge, ear pain, hearing loss and tinnitus.   Eyes: Negative for blurred vision, double vision, photophobia and pain.  Respiratory: Negative for cough  and shortness of breath.   Cardiovascular: Negative for chest pain and palpitations.  Gastrointestinal: Negative for abdominal pain, blood in stool, constipation, diarrhea, heartburn, melena, nausea and vomiting.  Genitourinary: Negative for dysuria, flank pain, frequency, hematuria and urgency.  Musculoskeletal: Negative for falls.  Neurological: Negative for dizziness, loss of consciousness and headaches.  Endo/Heme/Allergies: Negative for environmental allergies.  Psychiatric/Behavioral: Negative for depression, hallucinations, substance abuse and suicidal ideas. The patient is not nervous/anxious and does not have insomnia.    BP 120/82   Pulse 77   Temp 98.6 F (37 C) (Oral)    Resp 14   Ht 6\' 3"  (1.905 m)   Wt 246 lb (111.6 kg)   SpO2 96%   BMI 30.75 kg/m   Physical Exam  Constitutional: He is oriented to person, place, and time and well-developed, well-nourished, and in no distress.  HENT:  Head: Normocephalic and atraumatic.  Right Ear: External ear normal.  Left Ear: External ear normal.  Nose: Nose normal.  Mouth/Throat: Oropharynx is clear and moist. No oropharyngeal exudate.  Eyes: Conjunctivae and EOM are normal. Pupils are equal, round, and reactive to light.  Neck: Neck supple. No thyromegaly present.  Cardiovascular: Normal rate, regular rhythm, normal heart sounds and intact distal pulses.  Pulmonary/Chest: Effort normal and breath sounds normal. No respiratory distress. He has no wheezes. He has no rales. He exhibits no tenderness.  Abdominal: Soft. Bowel sounds are normal. He exhibits no distension and no mass. There is no tenderness. There is no rebound and no guarding.  Genitourinary: Testes/scrotum normal and penis normal. No discharge found.  Lymphadenopathy:    He has no cervical adenopathy.  Neurological: He is alert and oriented to person, place, and time.  Skin: Skin is warm and dry. No rash noted.  Psychiatric: Affect normal.  Vitals reviewed.  Assessment/Plan: Essential hypertension BP stable. Asymptomatic. Repeat CMP today. Continue current regimen.   Visit for preventive health examination Depression screen negative. Health Maintenance reviewed -- Immunizations are up-to-date. Preventive schedule discussed and handout given in AVS. Will obtain fasting labs today.   Low testosterone Repeat levels today.     Leeanne Rio, PA-C

## 2017-09-10 ENCOUNTER — Other Ambulatory Visit: Payer: Self-pay

## 2017-09-10 ENCOUNTER — Ambulatory Visit (INDEPENDENT_AMBULATORY_CARE_PROVIDER_SITE_OTHER): Payer: BLUE CROSS/BLUE SHIELD | Admitting: Physician Assistant

## 2017-09-10 ENCOUNTER — Encounter: Payer: Self-pay | Admitting: Physician Assistant

## 2017-09-10 VITALS — BP 120/82 | HR 77 | Temp 98.6°F | Resp 14 | Ht 75.0 in | Wt 246.0 lb

## 2017-09-10 DIAGNOSIS — I10 Essential (primary) hypertension: Secondary | ICD-10-CM

## 2017-09-10 DIAGNOSIS — R7989 Other specified abnormal findings of blood chemistry: Secondary | ICD-10-CM

## 2017-09-10 DIAGNOSIS — Z0001 Encounter for general adult medical examination with abnormal findings: Secondary | ICD-10-CM | POA: Insufficient documentation

## 2017-09-10 DIAGNOSIS — Z0000 Encounter for general adult medical examination without abnormal findings: Secondary | ICD-10-CM

## 2017-09-10 HISTORY — DX: Other specified abnormal findings of blood chemistry: R79.89

## 2017-09-10 HISTORY — DX: Encounter for general adult medical examination without abnormal findings: Z00.00

## 2017-09-10 LAB — HEMOGLOBIN A1C: Hgb A1c MFr Bld: 5.1 % (ref 4.6–6.5)

## 2017-09-10 LAB — CBC WITH DIFFERENTIAL/PLATELET
BASOS PCT: 1.3 % (ref 0.0–3.0)
Basophils Absolute: 0.1 10*3/uL (ref 0.0–0.1)
Eosinophils Absolute: 0.1 10*3/uL (ref 0.0–0.7)
Eosinophils Relative: 2.6 % (ref 0.0–5.0)
HEMATOCRIT: 44.3 % (ref 39.0–52.0)
Hemoglobin: 14.9 g/dL (ref 13.0–17.0)
LYMPHS PCT: 30.1 % (ref 12.0–46.0)
Lymphs Abs: 1.5 10*3/uL (ref 0.7–4.0)
MCHC: 33.6 g/dL (ref 30.0–36.0)
MCV: 100.2 fl — AB (ref 78.0–100.0)
MONOS PCT: 6.2 % (ref 3.0–12.0)
Monocytes Absolute: 0.3 10*3/uL (ref 0.1–1.0)
NEUTROS ABS: 3.1 10*3/uL (ref 1.4–7.7)
Neutrophils Relative %: 59.8 % (ref 43.0–77.0)
Platelets: 271 10*3/uL (ref 150.0–400.0)
RBC: 4.42 Mil/uL (ref 4.22–5.81)
RDW: 12.6 % (ref 11.5–15.5)
WBC: 5.1 10*3/uL (ref 4.0–10.5)

## 2017-09-10 LAB — TESTOSTERONE: TESTOSTERONE: 372.96 ng/dL (ref 300.00–890.00)

## 2017-09-10 LAB — COMPREHENSIVE METABOLIC PANEL
ALT: 20 U/L (ref 0–53)
AST: 25 U/L (ref 0–37)
Albumin: 4.6 g/dL (ref 3.5–5.2)
Alkaline Phosphatase: 60 U/L (ref 39–117)
BILIRUBIN TOTAL: 0.4 mg/dL (ref 0.2–1.2)
BUN: 9 mg/dL (ref 6–23)
CALCIUM: 9.3 mg/dL (ref 8.4–10.5)
CHLORIDE: 102 meq/L (ref 96–112)
CO2: 29 meq/L (ref 19–32)
Creatinine, Ser: 0.79 mg/dL (ref 0.40–1.50)
GFR: 111.65 mL/min (ref >60.00)
Glucose, Bld: 76 mg/dL (ref 70–99)
Potassium: 4.2 mEq/L (ref 3.5–5.1)
Sodium: 142 mEq/L (ref 135–145)
Total Protein: 7 g/dL (ref 6.0–8.3)

## 2017-09-10 LAB — LIPID PANEL
CHOL/HDL RATIO: 2
CHOLESTEROL: 189 mg/dL (ref 0–200)
HDL: 121 mg/dL (ref >39.00)
LDL Cholesterol: 59 mg/dL (ref 0–99)
NonHDL: 68.33
TRIGLYCERIDES: 46 mg/dL (ref 0.0–149.0)
VLDL: 9.2 mg/dL (ref 0.0–40.0)

## 2017-09-10 LAB — TSH: TSH: 0.92 u[IU]/mL (ref 0.35–4.50)

## 2017-09-10 NOTE — Assessment & Plan Note (Signed)
Depression screen negative. Health Maintenance reviewed -- Immunizations are up-to-date. Preventive schedule discussed and handout given in AVS. Will obtain fasting labs today.

## 2017-09-10 NOTE — Patient Instructions (Signed)
Please go to the lab for blood work.   Our office will call you with your results unless you have chosen to receive results via MyChart.  If your blood work is normal we will follow-up each year for physicals and as scheduled for chronic medical problems.  If anything is abnormal we will treat accordingly and get you in for a follow-up.  Please continue Enalapril as directed.   Preventive Care 40-64 Years, Male Preventive care refers to lifestyle choices and visits with your health care provider that can promote health and wellness. What does preventive care include?  A yearly physical exam. This is also called an annual well check.  Dental exams once or twice a year.  Routine eye exams. Ask your health care provider how often you should have your eyes checked.  Personal lifestyle choices, including: ? Daily care of your teeth and gums. ? Regular physical activity. ? Eating a healthy diet. ? Avoiding tobacco and drug use. ? Limiting alcohol use. ? Practicing safe sex. ? Taking low-dose aspirin every day starting at age 89. What happens during an annual well check? The services and screenings done by your health care provider during your annual well check will depend on your age, overall health, lifestyle risk factors, and family history of disease. Counseling Your health care provider may ask you questions about your:  Alcohol use.  Tobacco use.  Drug use.  Emotional well-being.  Home and relationship well-being.  Sexual activity.  Eating habits.  Work and work Statistician.  Screening You may have the following tests or measurements:  Height, weight, and BMI.  Blood pressure.  Lipid and cholesterol levels. These may be checked every 5 years, or more frequently if you are over 68 years old.  Skin check.  Lung cancer screening. You may have this screening every year starting at age 76 if you have a 30-pack-year history of smoking and currently smoke or have  quit within the past 15 years.  Fecal occult blood test (FOBT) of the stool. You may have this test every year starting at age 44.  Flexible sigmoidoscopy or colonoscopy. You may have a sigmoidoscopy every 5 years or a colonoscopy every 10 years starting at age 77.  Prostate cancer screening. Recommendations will vary depending on your family history and other risks.  Hepatitis C blood test.  Hepatitis B blood test.  Sexually transmitted disease (STD) testing.  Diabetes screening. This is done by checking your blood sugar (glucose) after you have not eaten for a while (fasting). You may have this done every 1-3 years.  Discuss your test results, treatment options, and if necessary, the need for more tests with your health care provider. Vaccines Your health care provider may recommend certain vaccines, such as:  Influenza vaccine. This is recommended every year.  Tetanus, diphtheria, and acellular pertussis (Tdap, Td) vaccine. You may need a Td booster every 10 years.  Varicella vaccine. You may need this if you have not been vaccinated.  Zoster vaccine. You may need this after age 20.  Measles, mumps, and rubella (MMR) vaccine. You may need at least one dose of MMR if you were born in 1957 or later. You may also need a second dose.  Pneumococcal 13-valent conjugate (PCV13) vaccine. You may need this if you have certain conditions and have not been vaccinated.  Pneumococcal polysaccharide (PPSV23) vaccine. You may need one or two doses if you smoke cigarettes or if you have certain conditions.  Meningococcal vaccine. You may  need this if you have certain conditions.  Hepatitis A vaccine. You may need this if you have certain conditions or if you travel or work in places where you may be exposed to hepatitis A.  Hepatitis B vaccine. You may need this if you have certain conditions or if you travel or work in places where you may be exposed to hepatitis B.  Haemophilus  influenzae type b (Hib) vaccine. You may need this if you have certain risk factors.  Talk to your health care provider about which screenings and vaccines you need and how often you need them. This information is not intended to replace advice given to you by your health care provider. Make sure you discuss any questions you have with your health care provider. Document Released: 10/08/2015 Document Revised: 05/31/2016 Document Reviewed: 07/13/2015 Elsevier Interactive Patient Education  2017 [employer].

## 2017-09-10 NOTE — Assessment & Plan Note (Signed)
Repeat levels today

## 2017-09-10 NOTE — Assessment & Plan Note (Signed)
BP stable. Asymptomatic. Repeat CMP today. Continue current regimen.

## 2017-11-28 ENCOUNTER — Encounter (HOSPITAL_COMMUNITY): Payer: Self-pay

## 2017-12-22 ENCOUNTER — Other Ambulatory Visit: Payer: Self-pay | Admitting: Family Medicine

## 2018-03-06 ENCOUNTER — Telehealth: Payer: Self-pay | Admitting: Family Medicine

## 2018-03-06 NOTE — Telephone Encounter (Signed)
Okay for transfer 

## 2018-03-06 NOTE — Telephone Encounter (Signed)
Copied from [place] [id number]. Topic: Quick Communication - See Telephone Encounter >> Mar 06, 2018 11:40 AM Cleaster Corin, NT wrote: CRM for notification. See Telephone encounter for: 03/06/18.  Pt. Calling wanting to Transfer pcp care form Dr. Birdie Riddle ([facility]) to Dr. Ethelene Hal or Dr. Deborra Medina Cherylann Banas) (whoever has 1st availability)

## 2018-03-07 NOTE — Telephone Encounter (Signed)
Transfer approved per Dr. Ethelene Hal.

## 2018-03-07 NOTE — Telephone Encounter (Signed)
I called and spoke to patient. Patient informed transfer was approved. Patient scheduled to see Dr. Ethelene Hal on 03/08/18 @ 8am.

## 2018-03-07 NOTE — Telephone Encounter (Signed)
Would be happy to see him

## 2018-03-08 ENCOUNTER — Encounter: Payer: Self-pay | Admitting: Family Medicine

## 2018-03-08 ENCOUNTER — Ambulatory Visit: Payer: BLUE CROSS/BLUE SHIELD | Admitting: Family Medicine

## 2018-03-08 VITALS — BP 124/90 | HR 76 | Temp 98.4°F | Ht 75.0 in | Wt 248.1 lb

## 2018-03-08 DIAGNOSIS — K6289 Other specified diseases of anus and rectum: Secondary | ICD-10-CM | POA: Diagnosis not present

## 2018-03-08 DIAGNOSIS — J309 Allergic rhinitis, unspecified: Secondary | ICD-10-CM | POA: Diagnosis not present

## 2018-03-08 HISTORY — DX: Other specified diseases of anus and rectum: K62.89

## 2018-03-08 MED ORDER — FLUTICASONE PROPIONATE 50 MCG/ACT NA SUSP: 2.0000 | Freq: Every day | NASAL | 6 refills | Status: DC

## 2018-03-08 MED ORDER — HYDROCORTISONE ACE-PRAMOXINE 1-1 % RE CREA: 1.0000 "application " | TOPICAL_CREAM | Freq: Two times a day (BID) | RECTAL | 0 refills | Status: DC

## 2018-03-08 MED ORDER — TUCKS 50 % EX PADS: MEDICATED_PAD | CUTANEOUS | 2 refills | Status: DC

## 2018-03-08 NOTE — Progress Notes (Signed)
Subjective:  Patient ID: Gilbert [name], male    DOB: June 14, 1971  Age: 47 y.o. MRN: [id number]  CC: Establish Care   HPI Gilbert [name] presents for establishment of care and is to discuss a couple medical issues with me.  He has been dealing with some irritation in the anal area accompanied by sporadic itching.  Most of the irritation occurs with wiping.  Patient stools daily.  There is been no abdominal pain or change in his stooling patterns.  Stools are soft and formed.  Bowel movements are not particularly painful.  He does have a history of external hemorrhoids in the past.  He does feel as though he spends more time on the toilet than most people.  He denies straining.  There is no family history of colorectal disease.  Patient also has been dealing with itchy watery eyes and postnasal drip.  Occasional sneeze.  Denies facial pressure teeth pain cough or wheezing.  Denies fever or chills.  Patient lives with his wife and 2 boys who are 41 and 71 years old.  He works in the [employer].  Outpatient Medications Prior to Visit  Medication Sig Dispense Refill  . enalapril (VASOTEC) 20 MG tablet TAKE 2 TABLETS (40 MG TOTAL) BY MOUTH DAILY. 180 tablet 1  . ibuprofen (ADVIL,MOTRIN) 200 MG tablet Take 400 mg by mouth every 6 (six) hours as needed. For back pain     No facility-administered medications prior to visit.     ROS Review of Systems  Constitutional: Negative for chills, fatigue, fever and unexpected weight change.  HENT: Positive for congestion, postnasal drip and sneezing. Negative for rhinorrhea, sinus pressure, sinus pain, trouble swallowing and voice change.   Eyes: Positive for itching.  Respiratory: Negative.   Cardiovascular: Negative.   Gastrointestinal: Negative.  Negative for abdominal pain, anal bleeding, blood in stool, constipation and diarrhea.  Endocrine: Negative for polyphagia and polyuria.  Genitourinary: Negative for difficulty  urinating, hematuria and urgency.  Skin: Negative for pallor and rash.  Allergic/Immunologic: Negative for immunocompromised state.  Neurological: Negative for headaches.  Hematological: Does not bruise/bleed easily.  Psychiatric/Behavioral: Negative.     Objective:  BP 124/90   Pulse 76   Temp 98.4 F (36.9 C)   Ht 6\' 3"  (1.905 m)   Wt 248 lb 2 oz (112.5 kg)   SpO2 97%   BMI 31.01 kg/m   BP Readings from Last 3 Encounters:  03/08/18 124/90  09/10/17 120/82  08/04/16 122/72    Wt Readings from Last 3 Encounters:  03/08/18 248 lb 2 oz (112.5 kg)  09/10/17 246 lb (111.6 kg)  08/04/16 232 lb 8 oz (105.5 kg)    Physical Exam  Constitutional: He is oriented to person, place, and time. He appears well-developed and well-nourished. No distress.  HENT:  Head: Normocephalic and atraumatic.  Right Ear: External ear normal.  Left Ear: External ear normal.  Nose: Nose normal.  Mouth/Throat: Oropharynx is clear and moist. No oropharyngeal exudate.  Eyes: Pupils are equal, round, and reactive to light. Conjunctivae and EOM are normal. Right eye exhibits no discharge. Left eye exhibits no discharge. No scleral icterus.  Neck: Normal range of motion. No JVD present. No tracheal deviation present. No thyromegaly present.  Cardiovascular: Normal rate, regular rhythm and normal heart sounds.  Pulmonary/Chest: Effort normal and breath sounds normal.  Abdominal: Soft. Bowel sounds are normal. He exhibits no distension. There is no tenderness. There is no guarding.  Genitourinary: Rectal exam shows no external hemorrhoid, no internal hemorrhoid, no fissure, no mass, no tenderness, anal tone normal and guaiac negative stool.     Musculoskeletal: He exhibits no edema.  Lymphadenopathy:    He has no cervical adenopathy.  Neurological: He is alert and oriented to person, place, and time.  Skin: Skin is warm and dry. Capillary refill takes less than 2 seconds. He is not diaphoretic. No  erythema. No pallor.  Psychiatric: He has a normal mood and affect. His behavior is normal.    Lab Results  Component Value Date   WBC 5.1 09/10/2017   HGB 14.9 09/10/2017   HCT 44.3 09/10/2017   PLT 271.0 09/10/2017   GLUCOSE 76 09/10/2017   CHOL 189 09/10/2017   TRIG 46.0 09/10/2017   HDL 121.00 09/10/2017   LDLCALC 59 09/10/2017   ALT 20 09/10/2017   AST 25 09/10/2017   NA 142 09/10/2017   K 4.2 09/10/2017   CL 102 09/10/2017   CREATININE 0.79 09/10/2017   BUN 9 09/10/2017   CO2 29 09/10/2017   TSH 0.92 09/10/2017   PSA 0.71 01/27/2016   HGBA1C 5.1 09/10/2017    Dg Chest 2 View  Result Date: 05/02/2012 *RADIOLOGY REPORT* Clinical Data: Recent lap band surgery, some shoulder pain CHEST - 2 VIEW Comparison: Chest x-ray of 04/16/2012 Findings: The lungs appear clear.  Mediastinal contours are normal. The heart is within normal limits in size.  The lap band is not optimally visualized but appears to extend from the 2 o'clock to 8 o'clock position. IMPRESSION: No active lung disease. Original Report Authenticated By: Gilbert [name], M.D.   Assessment & Plan:   Gilbert [name] was seen today for establish care.  Diagnoses and all orders for this visit:  Allergic rhinitis, unspecified seasonality, unspecified trigger -     fluticasone (FLONASE) 50 MCG/ACT nasal spray; Place 2 sprays into both nostrils daily.  Anal inflammation -     pramoxine-hydrocortisone (PROCTOCREAM-HC) 1-1 % rectal cream; Place 1 application rectally 2 (two) times daily. -     Witch Hazel (TUCKS) 50 % PADS; Clean rectal area twice each day and after stooling with pad.   I am having Gilbert [name] start on pramoxine-hydrocortisone, fluticasone, and TUCKS. I am also having him maintain his ibuprofen and enalapril.  Meds ordered this encounter  Medications  . pramoxine-hydrocortisone (PROCTOCREAM-HC) 1-1 % rectal cream    Sig: Place 1 application rectally 2 (two) times daily.    Dispense:  30 g    Refill:  0    . fluticasone (FLONASE) 50 MCG/ACT nasal spray    Sig: Place 2 sprays into both nostrils daily.    Dispense:  16 g    Refill:  6  . Witch Hazel (TUCKS) 50 % PADS    Sig: Clean rectal area twice each day and after stooling with pad.    Dispense:  40 each    Refill:  2   Patient will use ProctoCream twice a day and Tucks pads liberally and after stooling.  His anal irritation should respond to this.  He will also avoid sitting on the toilet longer than it is necessary to complete stooling.  He will follow-up if there is these matters do not resolve his symptoms his symptoms.  He will give Flonase a fair 2-week trial and follow-up if his symptoms are not relieved.  Otherwise will see him in the fall for a physical exam.  Follow-up: Return in about 3 months (around  06/08/2018), or if symptoms worsen or fail to improve.  Libby Maw, MD

## 2018-03-08 NOTE — Patient Instructions (Signed)
Allergic Rhinitis, Adult Allergic rhinitis is an allergic reaction that affects the mucous membrane inside the nose. It causes sneezing, a runny or stuffy nose, and the feeling of mucus going down the back of the throat (postnasal drip). Allergic rhinitis can be mild to severe. There are two types of allergic rhinitis:  Seasonal. This type is also called hay fever. It happens only during certain seasons.  Perennial. This type can happen at any time of the year.  What are the causes? This condition happens when the body's defense system (immune system) responds to certain harmless substances called allergens as though they were germs.  Seasonal allergic rhinitis is triggered by pollen, which can come from grasses, trees, and weeds. Perennial allergic rhinitis may be caused by:  House dust mites.  Pet dander.  Mold spores.  What are the signs or symptoms? Symptoms of this condition include:  Sneezing.  Runny or stuffy nose (nasal congestion).  Postnasal drip.  Itchy nose.  Tearing of the eyes.  Trouble sleeping.  Daytime sleepiness.  How is this diagnosed? This condition may be diagnosed based on:  Your medical history.  A physical exam.  Tests to check for related conditions, such as: ? Asthma. ? Pink eye. ? Ear infection. ? Upper respiratory infection.  Tests to find out which allergens trigger your symptoms. These may include skin or blood tests.  How is this treated? There is no cure for this condition, but treatment can help control symptoms. Treatment may include:  Taking medicines that block allergy symptoms, such as antihistamines. Medicine may be given as a shot, nasal spray, or pill.  Avoiding the allergen.  Desensitization. This treatment involves getting ongoing shots until your body becomes less sensitive to the allergen. This treatment may be done if other treatments do not help.  If taking medicine and avoiding the allergen does not work, new,  stronger medicines may be prescribed.  Follow these instructions at home:  Find out what you are allergic to. Common allergens include smoke, dust, and pollen.  Avoid the things you are allergic to. These are some things you can do to help avoid allergens: ? Replace carpet with wood, tile, or vinyl flooring. Carpet can trap dander and dust. ? Do not smoke. Do not allow smoking in your home. ? Change your heating and air conditioning filter at least once a month. ? During allergy season:  Keep windows closed as much as possible.  Plan outdoor activities when pollen counts are lowest. This is usually during the evening hours.  When coming indoors, change clothing and shower before sitting on furniture or bedding.  Take over-the-counter and prescription medicines only as told by your health care provider.  Keep all follow-up visits as told by your health care provider. This is important. Contact a health care provider if:  You have a fever.  You develop a persistent cough.  You make whistling sounds when you breathe (you wheeze).  Your symptoms interfere with your normal daily activities. Get help right away if:  You have shortness of breath. Summary  This condition can be managed by taking medicines as directed and avoiding allergens.  Contact your health care provider if you develop a persistent cough or fever.  During allergy season, keep windows closed as much as possible. This information is not intended to replace advice given to you by your health care provider. Make sure you discuss any questions you have with your health care provider. Document Released: 06/06/2001 Document Revised: 10/19/2016  Document Reviewed: 10/19/2016 Elsevier Interactive Patient Education  2018 [employer].  Anal Pruritus Anal pruritus is an itchy feeling in the anus and the skin in the anal area. This is common and can be caused by many things. It often occurs when the area becomes moist.  Moisture may be due to sweating or a small amount of stool (feces) that is left on the area because of poor personal cleaning. Some other causes include:  Perfumed soaps and sprays.  Colored toilet paper.  Chemicals in the foods that you eat.  Dietary factors, such as caffeine, beer, milk products, chocolate, nuts, citrus fruits, tomatoes, spicy seasonings, jalapeno peppers, and salsa.  Hemorrhoids, fissures, infections, and other anal diseases.  Excessive washing.  Overuse of laxatives.  Skin disorders (psoriasis, eczema, or seborrhea).  Some medical disorders, such as diabetes or thyroid problems.  Diarrhea.  STDs (sexually transmitted diseases).  Some cancers.  In many cases, the cause is not known. The itching usually goes away with treatment and home care. Scratching can cause further skin damage. Follow these instructions at home: Pay attention to any changes in your symptoms. Take these actions to help with your itching: Skin Care  Practice good hygiene. ? Clean the anal area gently with wet toilet paper, baby wipes, or a wet washcloth after every bowel movement and at bedtime. ? Avoid using soaps on the anal area. ? Dry the area thoroughly. Pat the area dry with toilet paper or a towel.  Do not scrub the anal area with anything, including toilet paper.  Do not scratch the itchy area. Scratching produces more damage and makes the itching worse.  Take sitz baths in warm water as told by your health care provider. Pat the area dry with a soft cloth after each bath.  Use creams or ointments as told by your health care provider. Zinc oxide ointment or a moisture barrier cream can be applied several times per day to protect the skin.  Do not use anything that irritates the skin, such as bubble baths, scented toilet paper, or genital deodorants. General instructions  Take over-the-counter and prescription medicines only as told by your health care provider.  Talk  with your health care provider about fiber supplements. These are helpful in keeping your stool normal if you have frequent loose stools.  Wear cotton underwear and loose clothing.  Keep all follow-up visits as told by your health care provider. This is important. Contact a health care provider if:  Your itching does not improve in several days.  Your itching gets worse.  You have a fever.  You have redness, swelling, or pain in the anal area.  You have fluid, blood, or pus coming from the anal area. This information is not intended to replace advice given to you by your health care provider. Make sure you discuss any questions you have with your health care provider. Document Released: 03/13/2011 Document Revised: 02/17/2016 Document Reviewed: 12/07/2014 Elsevier Interactive Patient Education  [employer].

## 2018-06-21 ENCOUNTER — Other Ambulatory Visit: Payer: Self-pay | Admitting: Family Medicine

## 2018-06-30 ENCOUNTER — Other Ambulatory Visit: Payer: Self-pay | Admitting: Family Medicine

## 2018-07-30 DIAGNOSIS — L570 Actinic keratosis: Secondary | ICD-10-CM | POA: Diagnosis not present

## 2018-07-30 DIAGNOSIS — B078 Other viral warts: Secondary | ICD-10-CM | POA: Diagnosis not present

## 2018-07-30 DIAGNOSIS — L731 Pseudofolliculitis barbae: Secondary | ICD-10-CM | POA: Diagnosis not present

## 2018-07-31 ENCOUNTER — Encounter (HOSPITAL_BASED_OUTPATIENT_CLINIC_OR_DEPARTMENT_OTHER): Payer: Self-pay | Admitting: Emergency Medicine

## 2018-07-31 ENCOUNTER — Observation Stay (HOSPITAL_BASED_OUTPATIENT_CLINIC_OR_DEPARTMENT_OTHER)
Admission: EM | Admit: 2018-07-31 | Discharge: 2018-08-01 | Disposition: A | Discharge status: Home / Self Care | Payer: BLUE CROSS/BLUE SHIELD | Attending: General Surgery | Admitting: General Surgery

## 2018-07-31 ENCOUNTER — Observation Stay (HOSPITAL_COMMUNITY): Payer: BLUE CROSS/BLUE SHIELD | Admitting: Certified Registered Nurse Anesthetist

## 2018-07-31 ENCOUNTER — Other Ambulatory Visit: Payer: Self-pay

## 2018-07-31 ENCOUNTER — Emergency Department (HOSPITAL_BASED_OUTPATIENT_CLINIC_OR_DEPARTMENT_OTHER): Payer: BLUE CROSS/BLUE SHIELD

## 2018-07-31 ENCOUNTER — Encounter (HOSPITAL_COMMUNITY)
Admission: EM | Disposition: A | Discharge status: Home / Self Care | Payer: Self-pay | Source: Home / Self Care | Attending: Emergency Medicine

## 2018-07-31 DIAGNOSIS — Z79899 Other long term (current) drug therapy: Secondary | ICD-10-CM | POA: Diagnosis not present

## 2018-07-31 DIAGNOSIS — K3580 Unspecified acute appendicitis: Secondary | ICD-10-CM | POA: Diagnosis not present

## 2018-07-31 DIAGNOSIS — Z9884 Bariatric surgery status: Secondary | ICD-10-CM | POA: Diagnosis not present

## 2018-07-31 DIAGNOSIS — Z6830 Body mass index (BMI) 30.0-30.9, adult: Secondary | ICD-10-CM | POA: Insufficient documentation

## 2018-07-31 DIAGNOSIS — Z880 Allergy status to penicillin: Secondary | ICD-10-CM | POA: Insufficient documentation

## 2018-07-31 DIAGNOSIS — I10 Essential (primary) hypertension: Secondary | ICD-10-CM | POA: Diagnosis not present

## 2018-07-31 DIAGNOSIS — R109 Unspecified abdominal pain: Secondary | ICD-10-CM | POA: Diagnosis not present

## 2018-07-31 DIAGNOSIS — Z7951 Long term (current) use of inhaled steroids: Secondary | ICD-10-CM | POA: Insufficient documentation

## 2018-07-31 DIAGNOSIS — K429 Umbilical hernia without obstruction or gangrene: Secondary | ICD-10-CM | POA: Diagnosis not present

## 2018-07-31 HISTORY — DX: Unspecified acute appendicitis: K35.80

## 2018-07-31 HISTORY — PX: LAPAROSCOPIC APPENDECTOMY: SHX408

## 2018-07-31 LAB — CBC WITH DIFFERENTIAL/PLATELET
ABS IMMATURE GRANULOCYTES: 0.02 10*3/uL (ref 0.00–0.07)
BASOS PCT: 1 %
Basophils Absolute: 0.1 10*3/uL (ref 0.0–0.1)
EOS ABS: 0.1 10*3/uL (ref 0.0–0.5)
Eosinophils Relative: 1 %
HCT: 44.5 % (ref 39.0–52.0)
Hemoglobin: 14.9 g/dL (ref 13.0–17.0)
Immature Granulocytes: 0 %
Lymphocytes Relative: 8 %
Lymphs Abs: 0.9 10*3/uL (ref 0.7–4.0)
MCH: 33.6 pg (ref 26.0–34.0)
MCHC: 33.5 g/dL (ref 30.0–36.0)
MCV: 100.5 fL — ABNORMAL HIGH (ref 80.0–100.0)
MONO ABS: 0.9 10*3/uL (ref 0.1–1.0)
Monocytes Relative: 8 %
NEUTROS ABS: 9 10*3/uL — AB (ref 1.7–7.7)
Neutrophils Relative %: 82 %
PLATELETS: 216 10*3/uL (ref 150–400)
RBC: 4.43 MIL/uL (ref 4.22–5.81)
RDW: 11.8 % (ref 11.5–15.5)
WBC: 10.9 10*3/uL — AB (ref 4.0–10.5)
nRBC: 0 % (ref 0.0–0.2)

## 2018-07-31 LAB — COMPREHENSIVE METABOLIC PANEL
ALT: 29 U/L (ref 0–44)
AST: 33 U/L (ref 15–41)
Albumin: 4.3 g/dL (ref 3.5–5.0)
Alkaline Phosphatase: 49 U/L (ref 38–126)
Anion gap: 13 (ref 5–15)
BUN: 12 mg/dL (ref 6–20)
CO2: 25 mmol/L (ref 22–32)
Calcium: 9.8 mg/dL (ref 8.9–10.3)
Chloride: 98 mmol/L (ref 98–111)
Creatinine, Ser: 0.8 mg/dL (ref 0.61–1.24)
GFR calc Af Amer: >60 mL/min (ref >60)
GFR calc non Af Amer: >60 mL/min (ref >60)
Glucose, Bld: 111 mg/dL — ABNORMAL HIGH (ref 70–99)
Potassium: 3.6 mmol/L (ref 3.5–5.1)
Sodium: 136 mmol/L (ref 135–145)
Total Bilirubin: 1 mg/dL (ref 0.3–1.2)
Total Protein: 7.1 g/dL (ref 6.5–8.1)

## 2018-07-31 LAB — URINALYSIS, ROUTINE W REFLEX MICROSCOPIC
Glucose, UA: NEGATIVE mg/dL
Hgb urine dipstick: NEGATIVE
KETONES UR: 15 mg/dL — AB
Leukocytes, UA: NEGATIVE
Nitrite: NEGATIVE
PROTEIN: NEGATIVE mg/dL
Specific Gravity, Urine: 1.01 (ref 1.005–1.030)
pH: 7 (ref 5.0–8.0)

## 2018-07-31 LAB — LIPASE, BLOOD: LIPASE: 27 U/L (ref 11–51)

## 2018-07-31 LAB — SURGICAL PCR SCREEN
MRSA, PCR: NEGATIVE
Staphylococcus aureus: POSITIVE — AB

## 2018-07-31 SURGERY — APPENDECTOMY, LAPAROSCOPIC
Anesthesia: General | Site: Abdomen

## 2018-07-31 MED ORDER — ONDANSETRON HCL 4 MG/2ML IJ SOLN
4.0000 mg | Freq: Once | INTRAMUSCULAR | Status: AC
  Administered 2018-07-31: 4 mg via INTRAVENOUS
  Filled 2018-07-31: qty 2

## 2018-07-31 MED ORDER — HYDRALAZINE HCL 20 MG/ML IJ SOLN: 10.0000 mg | INTRAMUSCULAR | Status: DC | PRN

## 2018-07-31 MED ORDER — SCOPOLAMINE 1 MG/3DAYS TD PT72
MEDICATED_PATCH | TRANSDERMAL | Status: AC
  Filled 2018-07-31: qty 1

## 2018-07-31 MED ORDER — DEXAMETHASONE SODIUM PHOSPHATE 10 MG/ML IJ SOLN
INTRAMUSCULAR | Status: AC
  Filled 2018-07-31: qty 2

## 2018-07-31 MED ORDER — PROPOFOL 10 MG/ML IV BOLUS
INTRAVENOUS | Status: DC | PRN
  Administered 2018-07-31: 200 mg via INTRAVENOUS

## 2018-07-31 MED ORDER — LACTATED RINGERS IR SOLN
Status: DC | PRN
  Administered 2018-07-31: 1000 mL

## 2018-07-31 MED ORDER — KETOROLAC TROMETHAMINE 30 MG/ML IJ SOLN: 30.0000 mg | Freq: Once | INTRAMUSCULAR | Status: DC | PRN

## 2018-07-31 MED ORDER — SUCCINYLCHOLINE CHLORIDE 200 MG/10ML IV SOSY
PREFILLED_SYRINGE | INTRAVENOUS | Status: DC | PRN
  Administered 2018-07-31: 140 mg via INTRAVENOUS

## 2018-07-31 MED ORDER — LACTATED RINGERS IV SOLN
INTRAVENOUS | Status: DC | PRN
  Administered 2018-07-31 (×2): via INTRAVENOUS

## 2018-07-31 MED ORDER — 0.9 % SODIUM CHLORIDE (POUR BTL) OPTIME
TOPICAL | Status: DC | PRN
  Administered 2018-07-31: 1000 mL

## 2018-07-31 MED ORDER — ONDANSETRON 4 MG PO TBDP
ORAL_TABLET | ORAL | Status: AC
  Filled 2018-07-31: qty 1

## 2018-07-31 MED ORDER — ACETAMINOPHEN 500 MG PO TABS
1000.0000 mg | ORAL_TABLET | ORAL | Status: AC
  Administered 2018-07-31: 1000 mg via ORAL
  Filled 2018-07-31: qty 2

## 2018-07-31 MED ORDER — ACETAMINOPHEN 650 MG RE SUPP: 650.0000 mg | Freq: Four times a day (QID) | RECTAL | Status: DC | PRN

## 2018-07-31 MED ORDER — DEXAMETHASONE SODIUM PHOSPHATE 10 MG/ML IJ SOLN
INTRAMUSCULAR | Status: DC | PRN
  Administered 2018-07-31: 10 mg via INTRAVENOUS

## 2018-07-31 MED ORDER — MIDAZOLAM HCL 2 MG/2ML IJ SOLN
INTRAMUSCULAR | Status: AC
  Filled 2018-07-31: qty 2

## 2018-07-31 MED ORDER — METRONIDAZOLE IN NACL 5-0.79 MG/ML-% IV SOLN
500.0000 mg | Freq: Three times a day (TID) | INTRAVENOUS | Status: DC
  Administered 2018-07-31 – 2018-08-01 (×3): 500 mg via INTRAVENOUS
  Filled 2018-07-31 (×3): qty 100

## 2018-07-31 MED ORDER — BUPIVACAINE-EPINEPHRINE 0.25% -1:200000 IJ SOLN
INTRAMUSCULAR | Status: DC | PRN
  Administered 2018-07-31: 20 mL

## 2018-07-31 MED ORDER — OXYCODONE HCL 5 MG PO TABS: 5.0000 mg | ORAL_TABLET | ORAL | 0 refills | Status: DC | PRN

## 2018-07-31 MED ORDER — FENTANYL CITRATE (PF) 250 MCG/5ML IJ SOLN
INTRAMUSCULAR | Status: AC
  Filled 2018-07-31: qty 5

## 2018-07-31 MED ORDER — ROCURONIUM BROMIDE 10 MG/ML (PF) SYRINGE
PREFILLED_SYRINGE | INTRAVENOUS | Status: DC | PRN
  Administered 2018-07-31: 50 mg via INTRAVENOUS

## 2018-07-31 MED ORDER — ENALAPRIL MALEATE 10 MG PO TABS
40.0000 mg | ORAL_TABLET | Freq: Every day | ORAL | Status: DC
  Administered 2018-08-01: 40 mg via ORAL
  Filled 2018-07-31 (×2): qty 4

## 2018-07-31 MED ORDER — ONDANSETRON 8 MG PO TBDP
8.0000 mg | ORAL_TABLET | Freq: Once | ORAL | Status: AC
  Administered 2018-07-31: 8 mg via ORAL
  Filled 2018-07-31: qty 1

## 2018-07-31 MED ORDER — SODIUM CHLORIDE 0.9 % IV SOLN
Freq: Once | INTRAVENOUS | Status: AC
  Administered 2018-07-31: 06:00:00 via INTRAVENOUS

## 2018-07-31 MED ORDER — MIDAZOLAM HCL 5 MG/5ML IJ SOLN
INTRAMUSCULAR | Status: DC | PRN
  Administered 2018-07-31: 2 mg via INTRAVENOUS

## 2018-07-31 MED ORDER — PROMETHAZINE HCL 25 MG/ML IJ SOLN: 6.2500 mg | INTRAMUSCULAR | Status: DC | PRN

## 2018-07-31 MED ORDER — PROPOFOL 10 MG/ML IV BOLUS
INTRAVENOUS | Status: AC
  Filled 2018-07-31: qty 20

## 2018-07-31 MED ORDER — LACTATED RINGERS IV SOLN
INTRAVENOUS | Status: DC
  Administered 2018-07-31: 16:00:00 via INTRAVENOUS

## 2018-07-31 MED ORDER — MORPHINE SULFATE (PF) 4 MG/ML IV SOLN
INTRAVENOUS | Status: AC
  Filled 2018-07-31: qty 1

## 2018-07-31 MED ORDER — ACETAMINOPHEN 325 MG PO TABS: 650.0000 mg | ORAL_TABLET | Freq: Four times a day (QID) | ORAL | Status: DC | PRN

## 2018-07-31 MED ORDER — FENTANYL CITRATE (PF) 100 MCG/2ML IJ SOLN
INTRAMUSCULAR | Status: DC | PRN
  Administered 2018-07-31: 50 ug via INTRAVENOUS
  Administered 2018-07-31 (×2): 100 ug via INTRAVENOUS

## 2018-07-31 MED ORDER — FENTANYL CITRATE (PF) 100 MCG/2ML IJ SOLN
100.0000 ug | Freq: Once | INTRAMUSCULAR | Status: AC
  Administered 2018-07-31: 100 ug via INTRAVENOUS
  Filled 2018-07-31: qty 2

## 2018-07-31 MED ORDER — LIDOCAINE 2% (20 MG/ML) 5 ML SYRINGE
INTRAMUSCULAR | Status: DC | PRN
  Administered 2018-07-31: 1.5 mg/kg/h via INTRAVENOUS

## 2018-07-31 MED ORDER — DIPHENHYDRAMINE HCL 50 MG/ML IJ SOLN: 25.0000 mg | Freq: Four times a day (QID) | INTRAMUSCULAR | Status: DC | PRN

## 2018-07-31 MED ORDER — ONDANSETRON HCL 4 MG/2ML IJ SOLN
INTRAMUSCULAR | Status: DC | PRN
  Administered 2018-07-31: 4 mg via INTRAVENOUS

## 2018-07-31 MED ORDER — DIPHENHYDRAMINE HCL 25 MG PO CAPS
25.0000 mg | ORAL_CAPSULE | Freq: Four times a day (QID) | ORAL | Status: DC | PRN
  Administered 2018-07-31: 25 mg via ORAL
  Filled 2018-07-31: qty 1

## 2018-07-31 MED ORDER — SCOPOLAMINE 1 MG/3DAYS TD PT72
MEDICATED_PATCH | TRANSDERMAL | Status: DC | PRN
  Administered 2018-07-31: 1 via TRANSDERMAL

## 2018-07-31 MED ORDER — GABAPENTIN 300 MG PO CAPS
300.0000 mg | ORAL_CAPSULE | ORAL | Status: AC
  Administered 2018-07-31: 300 mg via ORAL
  Filled 2018-07-31: qty 1

## 2018-07-31 MED ORDER — OXYCODONE HCL 5 MG PO TABS
5.0000 mg | ORAL_TABLET | ORAL | Status: DC | PRN
  Administered 2018-07-31: 5 mg via ORAL
  Administered 2018-08-01: 10 mg via ORAL
  Filled 2018-07-31: qty 1
  Filled 2018-07-31: qty 2

## 2018-07-31 MED ORDER — LACTATED RINGERS IV SOLN: INTRAVENOUS | Status: DC

## 2018-07-31 MED ORDER — KETAMINE HCL 10 MG/ML IJ SOLN
INTRAMUSCULAR | Status: DC | PRN
  Administered 2018-07-31: 50 mg via INTRAVENOUS

## 2018-07-31 MED ORDER — ONDANSETRON 4 MG PO TBDP: 4.0000 mg | ORAL_TABLET | Freq: Four times a day (QID) | ORAL | Status: DC | PRN

## 2018-07-31 MED ORDER — CELECOXIB 200 MG PO CAPS
400.0000 mg | ORAL_CAPSULE | ORAL | Status: AC
  Administered 2018-07-31: 400 mg via ORAL
  Filled 2018-07-31: qty 2

## 2018-07-31 MED ORDER — LABETALOL HCL 5 MG/ML IV SOLN
INTRAVENOUS | Status: DC | PRN
  Administered 2018-07-31: 5 mg via INTRAVENOUS

## 2018-07-31 MED ORDER — HYDROMORPHONE HCL 1 MG/ML IJ SOLN: 0.2500 mg | INTRAMUSCULAR | Status: DC | PRN

## 2018-07-31 MED ORDER — IOPAMIDOL (ISOVUE-300) INJECTION 61%
100.0000 mL | Freq: Once | INTRAVENOUS | Status: AC | PRN
  Administered 2018-07-31: 100 mL via INTRAVENOUS

## 2018-07-31 MED ORDER — KETAMINE HCL 10 MG/ML IJ SOLN
INTRAMUSCULAR | Status: AC
  Filled 2018-07-31: qty 1

## 2018-07-31 MED ORDER — SODIUM CHLORIDE 0.9 % IV SOLN
2.0000 g | INTRAVENOUS | Status: DC
  Administered 2018-07-31 – 2018-08-01 (×2): 2 g via INTRAVENOUS
  Filled 2018-07-31 (×2): qty 2
  Filled 2018-07-31: qty 20

## 2018-07-31 MED ORDER — MORPHINE SULFATE (PF) 2 MG/ML IV SOLN
1.0000 mg | INTRAVENOUS | Status: DC | PRN
  Administered 2018-07-31: 2 mg via INTRAVENOUS
  Filled 2018-07-31: qty 1

## 2018-07-31 MED ORDER — KCL IN DEXTROSE-NACL 20-5-0.45 MEQ/L-%-% IV SOLN
INTRAVENOUS | Status: DC
  Administered 2018-07-31 – 2018-08-01 (×3): via INTRAVENOUS
  Filled 2018-07-31 (×3): qty 1000

## 2018-07-31 MED ORDER — ONDANSETRON HCL 4 MG/2ML IJ SOLN
INTRAMUSCULAR | Status: AC
  Filled 2018-07-31: qty 2

## 2018-07-31 MED ORDER — MORPHINE SULFATE (PF) 4 MG/ML IV SOLN
4.0000 mg | Freq: Once | INTRAVENOUS | Status: AC
  Administered 2018-07-31: 4 mg via INTRAVENOUS

## 2018-07-31 MED ORDER — ONDANSETRON HCL 4 MG/2ML IJ SOLN
4.0000 mg | Freq: Four times a day (QID) | INTRAMUSCULAR | Status: DC | PRN
  Administered 2018-07-31: 4 mg via INTRAVENOUS
  Filled 2018-07-31: qty 2

## 2018-07-31 MED ORDER — SUGAMMADEX SODIUM 500 MG/5ML IV SOLN
INTRAVENOUS | Status: DC | PRN
  Administered 2018-07-31: 225 mg via INTRAVENOUS

## 2018-07-31 MED ORDER — BUPIVACAINE-EPINEPHRINE (PF) 0.25% -1:200000 IJ SOLN
INTRAMUSCULAR | Status: AC
  Filled 2018-07-31: qty 30

## 2018-07-31 SURGICAL SUPPLY — 38 items
ADH SKN CLS APL DERMABOND .7 (GAUZE/BANDAGES/DRESSINGS) ×1
APPLIER CLIP ROT 10 11.4 M/L (STAPLE)
APR CLP MED LRG 11.4X10 (STAPLE)
CABLE HIGH FREQUENCY MONO STRZ (ELECTRODE) ×3 IMPLANT
CHLORAPREP W/TINT 26ML (MISCELLANEOUS) ×3 IMPLANT
CLIP APPLIE ROT 10 11.4 M/L (STAPLE) IMPLANT
COVER WAND RF STERILE (DRAPES) IMPLANT
CUTTER FLEX LINEAR 45M (STAPLE) ×3 IMPLANT
DECANTER SPIKE VIAL GLASS SM (MISCELLANEOUS) ×3 IMPLANT
DERMABOND ADVANCED (GAUZE/BANDAGES/DRESSINGS) ×2
DERMABOND ADVANCED .7 DNX12 (GAUZE/BANDAGES/DRESSINGS) ×1 IMPLANT
ELECT REM PT RETURN 15FT ADLT (MISCELLANEOUS) ×3 IMPLANT
ENDOLOOP SUT PDS II  0 18 (SUTURE)
ENDOLOOP SUT PDS II 0 18 (SUTURE) IMPLANT
GLOVE BIO SURGEON STRL SZ7.5 (GLOVE) ×3 IMPLANT
GLOVE BIOGEL PI IND STRL 6.5 (GLOVE) ×1 IMPLANT
GLOVE BIOGEL PI INDICATOR 6.5 (GLOVE) ×2
GLOVE SURG SS PI 6.0 STRL IVOR (GLOVE) ×3 IMPLANT
GOWN STRL REUS W/ TWL LRG LVL3 (GOWN DISPOSABLE) ×1 IMPLANT
GOWN STRL REUS W/TWL LRG LVL3 (GOWN DISPOSABLE) ×3
GOWN STRL REUS W/TWL XL LVL3 (GOWN DISPOSABLE) ×3 IMPLANT
KIT BASIN OR (CUSTOM PROCEDURE TRAY) ×3 IMPLANT
PAD POSITIONER PINK NONSTERILE (MISCELLANEOUS) ×3 IMPLANT
POUCH SPECIMEN RETRIEVAL 10MM (ENDOMECHANICALS) ×3 IMPLANT
RELOAD 45 THICK GREEN (ENDOMECHANICALS) ×6 IMPLANT
RELOAD 45 VASCULAR/THIN (ENDOMECHANICALS) IMPLANT
RELOAD STAPLE TA45 3.5 REG BLU (ENDOMECHANICALS) IMPLANT
SCISSORS LAP 5X35 DISP (ENDOMECHANICALS) ×3 IMPLANT
SET IRRIG TUBING LAPAROSCOPIC (IRRIGATION / IRRIGATOR) ×3 IMPLANT
SHEARS HARMONIC ACE PLUS 36CM (ENDOMECHANICALS) ×3 IMPLANT
SLEEVE XCEL OPT CAN 5 100 (ENDOMECHANICALS) ×3 IMPLANT
SUT MNCRL AB 4-0 PS2 18 (SUTURE) ×3 IMPLANT
TOWEL OR 17X26 10 PK STRL BLUE (TOWEL DISPOSABLE) ×3 IMPLANT
TRAY FOLEY MTR SLVR 16FR STAT (SET/KITS/TRAYS/PACK) IMPLANT
TRAY LAPAROSCOPIC (CUSTOM PROCEDURE TRAY) ×3 IMPLANT
TROCAR BLADELESS OPT 5 100 (ENDOMECHANICALS) ×3 IMPLANT
TROCAR XCEL BLUNT TIP 100MML (ENDOMECHANICALS) ×3 IMPLANT
TUBING INSUF HEATED (TUBING) ×3 IMPLANT

## 2018-07-31 NOTE — ED Notes (Signed)
Patient transported to CT 

## 2018-07-31 NOTE — ED Provider Notes (Signed)
  Physical Exam  BP 138/90 (BP Location: Right Arm)   Pulse (!) 59   Temp 98.6 F (37 C) (Oral)   Resp 16   Ht 6\' 3"  (1.905 m)   Wt 111.1 kg   SpO2 98%   BMI 30.62 kg/m   Physical Exam  ED Course/Procedures     Procedures  MDM  Transfer from [facility].  Appendicitis.  Still has right lower quadrant tenderness.  Will discuss with general surgery       Davonna Belling, MD 07/31/18 [phone]

## 2018-07-31 NOTE — ED Notes (Signed)
Returned from CT.

## 2018-07-31 NOTE — Op Note (Signed)
07/31/2018  6:49 PM  PATIENT:  Gilbert [name]  47 y.o. male  PRE-OPERATIVE DIAGNOSIS:  acute appendicitis  POST-OPERATIVE DIAGNOSIS:  acute appendicitis  PROCEDURE:  Procedure(s): APPENDECTOMY LAPAROSCOPIC (N/A)  SURGEON:  Surgeon(s) and Role:    * Jovita Kussmaul, MD - Primary  PHYSICIAN ASSISTANT:   ASSISTANTS: none   ANESTHESIA:   general  EBL:  5 mL   BLOOD ADMINISTERED:none  DRAINS: none   LOCAL MEDICATIONS USED:  MARCAINE     SPECIMEN:  Source of Specimen:  appendix  DISPOSITION OF SPECIMEN:  PATHOLOGY  COUNTS:  YES  TOURNIQUET:  * No tourniquets in log *  DICTATION: .Dragon Dictation   After informed consent was obtained patient was brought to the operating room placed in the supine position on the operating room table. After adequate induction of general anesthesia the patient's abdomen was prepped with ChloraPrep, allowed to dry, and draped in usual sterile manner. The area below the umbilicus was infiltrated with quarter percent Marcaine. A small incision was made with a 15 blade knife. This incision was carried down through the subcutaneous tissue bluntly with a hemostat and [employer] retractors until the linea alba was identified. The linea alba was incised with a 15 blade knife. Each side was grasped Coker clamps and elevated anteriorly. The preperitoneal space was probed bluntly with a hemostat until the peritoneum was opened and access was gained to the abdominal cavity. A 0 Vicryl purse string stitch was placed in the fascia surrounding the opening. A Hassan cannula was placed through the opening and anchored in place with the previously placed Vicryl purse string stitch. The laparoscope was placed through the [facility] cannula. The abdomen was insufflated with carbon dioxide without difficulty. Next the suprapubic area was infiltrated with quarter percent Marcaine. A small incision was made with a 15 blade knife. A 5 mm port was placed bluntly through this incision  into the abdominal cavity. A site was then chosen between the 2 port for placement of a 5 mm port. The area was infiltrated with quarter percent Marcaine. A small stab incision was made with a 15 blade knife. A 5 mm port was placed bluntly through this incision and the abdominal cavity under direct vision. The laparoscope was then moved to the suprapubic port. Using a Glassman grasper and harmonic scalpel the right lower quadrant was inspected. The appendix was readily identified. The appendix was elevated anteriorly and the mesoappendix was taken down sharply with the harmonic scalpel. Once the base of the appendix where it joined the cecum was identified and cleared of any tissue then a laparoscopic GIA green load 6 row stapler was placed through the [facility] cannula. The stapler was placed across the base of the appendix clamped and fired thereby dividing the base of the appendix between staple lines. A laparoscopic bag was then inserted through the [facility] cannula. The appendix was placed within the bag and the bag was sealed. The abdomen was then irrigated with copious amounts of saline until the effluent was clear. No other abnormalities were noted. The appendix and bag were removed with the [facility] cannula through the infraumbilical port without difficulty. The fascial defect was closed with the previously placed Vicryl pursestring stitch as well as with another interrupted 0 Vicryl figure-of-eight stitch. The rest of the ports were removed under direct vision and were found to be hemostatic. The gas was allowed to escape. The skin incisions were closed with interrupted 4-0 Monocryl subcuticular stitches. Dermabond dressings were applied. The  patient tolerated the procedure well. At the end of the case all needle sponge and instrument counts were correct. The patient was then awakened and taken to recovery in stable condition.  PLAN OF CARE: Admit for overnight observation  PATIENT DISPOSITION:  PACU -  hemodynamically stable.   Delay start of Pharmacological VTE agent (>24hrs) due to surgical blood loss or risk of bleeding: no

## 2018-07-31 NOTE — Anesthesia Postprocedure Evaluation (Signed)
Anesthesia Post Note  Patient: Gilbert [name]  Procedure(s) Performed: APPENDECTOMY LAPAROSCOPIC (N/A Abdomen)     Patient location during evaluation: PACU Anesthesia Type: General Level of consciousness: awake and alert Pain management: pain level controlled Vital Signs Assessment: post-procedure vital signs reviewed and stable Respiratory status: spontaneous breathing, nonlabored ventilation, respiratory function stable and patient connected to nasal cannula oxygen Cardiovascular status: blood pressure returned to baseline and stable Postop Assessment: no apparent nausea or vomiting Anesthetic complications: no    Last Vitals:  Vitals:   07/31/18 1930 07/31/18 1945  BP: (!) 148/86 (!) 144/91  Pulse: 62 (!) 57  Resp: 14 16  Temp:  36.9 C  SpO2: 97% 98%    Last Pain:  Vitals:   07/31/18 1945  TempSrc:   PainSc: 5                  Ryan P Ellender

## 2018-07-31 NOTE — ED Provider Notes (Signed)
[facility] DEPT Provider Note: Georgena Spurling, MD, FACEP  CSN: [id number] MRN: [id number] ARRIVAL: 07/31/18 at 0534 ROOM: 1530/1530-01   CHIEF COMPLAINT  Abdominal Pain   HISTORY OF PRESENT ILLNESS  07/31/18 5:46 AM Gilbert [name] is a 47 y.o. male who has had lower abdominal pain since yesterday evening.  He describes the pain is sharp and constant and is located across his lower abdomen.  It is somewhat worse with movement.  He rates the pain as 6 out of 10 and is making it difficult to sleep.  There has been no associated fever, nausea, vomiting, diarrhea or dysuria.  He has been passing gas but this does not relieve the pain.   He has chronic postnasal drip which is been worse for the past few days.  This makes him gag but he does not believe that is related to his current abdominal pain.  Past Medical History:  Diagnosis Date  . Chronic back pain   . GERD (gastroesophageal reflux disease)   . Hepatitis    unknown type many yrs ago - no residual problems  . Hypertension   . Insomnia   . Morbid obesity ([place])   . Ocular rosacea   . Sleep apnea    STOP BANG SCORE 6    Past Surgical History:  Procedure Laterality Date  . HIATAL HERNIA REPAIR  04/23/2012   Procedure: HERNIA REPAIR HIATAL;  Surgeon: Pedro Earls, MD;  Location: [facility] ORS;  Service: General;  Laterality: N/A;  . KNEE SURGERY     right  . LAPAROSCOPIC GASTRIC BANDING  04/23/2012   Procedure: LAPAROSCOPIC GASTRIC BANDING;  Surgeon: Pedro Earls, MD;  Location: [facility] ORS;  Service: General;  Laterality: N/A;  . LASIK      Family History  Problem Relation Age of Onset  . Hypertension Father   . Diabetes Father   . Heart disease Father        pacemaker    Social History   Tobacco Use  . Smoking status: Never Smoker  . Smokeless tobacco: Never Used  Substance Use Topics  . Alcohol use: Yes    Comment: social  . Drug use: No    Prior to Admission medications   Medication Sig Start Date End  Date Taking? Authorizing Provider  enalapril (VASOTEC) 20 MG tablet TAKE 2 TABLETS (40 MG TOTAL) BY MOUTH DAILY. 12/24/17  Yes Midge Minium, MD  fluticasone (FLONASE) 50 MCG/ACT nasal spray Place 2 sprays into both nostrils daily. 03/08/18  Yes Libby Maw, MD  ibuprofen (ADVIL,MOTRIN) 200 MG tablet Take 400 mg by mouth every 6 (six) hours as needed. For back pain   Yes [provider]  Melatonin 10 MG TABS Take 1 tablet by mouth at bedtime as needed (sleep).   Yes [provider]  acetaminophen (TYLENOL) 325 MG tablet Take 2 tablets (650 mg total) by mouth every 6 (six) hours as needed for mild pain (or temp > 100). 07/31/18   Rayburn, Floyce Stakes, PA-C  oxyCODONE (OXY IR/ROXICODONE) 5 MG immediate release tablet Take 1 tablet (5 mg total) by mouth every 4 (four) hours as needed for severe pain. 07/31/18   Rayburn, Floyce Stakes, PA-C  pramoxine-hydrocortisone (PROCTOCREAM-HC) 1-1 % rectal cream Place 1 application rectally 2 (two) times daily. Patient not taking: Reported on 07/31/2018 03/08/18   Libby Maw, MD  Witch Hazel (TUCKS) 50 % PADS Clean rectal area twice each day and after stooling with pad. Patient not taking:  Reported on 07/31/2018 03/08/18   Libby Maw, MD    Allergies Penicillins   REVIEW OF SYSTEMS  Negative except as noted here or in the History of Present Illness.   PHYSICAL EXAMINATION  Initial Vital Signs Blood pressure (!) 133/99, pulse 70, temperature 98.6 F (37 C), temperature source Oral, resp. rate 18, height 6\' 3"  (1.905 m), weight 111.1 kg, SpO2 100 %.  Examination General: Well-developed, well-nourished male in no acute distress; appearance consistent with age of record HENT: normocephalic; atraumatic Eyes: pupils equal, round and reactive to light; extraocular muscles intact Neck: supple Heart: regular rate and rhythm Lungs: clear to auscultation bilaterally Abdomen: soft; nondistended; lower abdominal  tenderness most prominent in the center along with lesser right upper quadrant tenderness; no masses or hepatosplenomegaly; bowel sounds present Extremities: No deformity; full range of motion; pulses normal Neurologic: Awake, alert and oriented; motor function intact in all extremities and symmetric; no facial droop Skin: Warm and dry Psychiatric: Normal mood and affect   RESULTS  Summary of this visit's results, reviewed by myself:   EKG Interpretation  Date/Time:    Ventricular Rate:    PR Interval:    QRS Duration:   QT Interval:    QTC Calculation:   R Axis:     Text Interpretation:        Laboratory Studies: Results for orders placed or performed during the hospital encounter of 07/31/18 (from the past 24 hour(s))  CBC with Differential/Platelet     Status: Abnormal   Collection Time: 07/31/18  5:53 AM  Result Value Ref Range   WBC 10.9 (H) 4.0 - 10.5 K/uL   RBC 4.43 4.22 - 5.81 MIL/uL   Hemoglobin 14.9 13.0 - 17.0 g/dL   HCT 44.5 39.0 - 52.0 %   MCV 100.5 (H) 80.0 - 100.0 fL   MCH 33.6 26.0 - 34.0 pg   MCHC 33.5 30.0 - 36.0 g/dL   RDW 11.8 11.5 - 15.5 %   Platelets 216 150 - 400 K/uL   nRBC 0.0 0.0 - 0.2 %   Neutrophils Relative % 82 %   Neutro Abs 9.0 (H) 1.7 - 7.7 K/uL   Lymphocytes Relative 8 %   Lymphs Abs 0.9 0.7 - 4.0 K/uL   Monocytes Relative 8 %   Monocytes Absolute 0.9 0.1 - 1.0 K/uL   Eosinophils Relative 1 %   Eosinophils Absolute 0.1 0.0 - 0.5 K/uL   Basophils Relative 1 %   Basophils Absolute 0.1 0.0 - 0.1 K/uL   Immature Granulocytes 0 %   Abs Immature Granulocytes 0.02 0.00 - 0.07 K/uL  Comprehensive metabolic panel     Status: Abnormal   Collection Time: 07/31/18  5:53 AM  Result Value Ref Range   Sodium 136 135 - 145 mmol/L   Potassium 3.6 3.5 - 5.1 mmol/L   Chloride 98 98 - 111 mmol/L   CO2 25 22 - 32 mmol/L   Glucose, Bld 111 (H) 70 - 99 mg/dL   BUN 12 6 - 20 mg/dL   Creatinine, Ser 0.80 0.61 - 1.24 mg/dL   Calcium 9.8 8.9 - 10.3  mg/dL   Total Protein 7.1 6.5 - 8.1 g/dL   Albumin 4.3 3.5 - 5.0 g/dL   AST 33 15 - 41 U/L   ALT 29 0 - 44 U/L   Alkaline Phosphatase 49 38 - 126 U/L   Total Bilirubin 1.0 0.3 - 1.2 mg/dL   GFR calc non Af Amer >60 >60 mL/min  GFR calc Af Amer >60 >60 mL/min   Anion gap 13 5 - 15  Lipase, blood     Status: None   Collection Time: 07/31/18  5:53 AM  Result Value Ref Range   Lipase 27 11 - 51 U/L  Urinalysis, Routine w reflex microscopic     Status: Abnormal   Collection Time: 07/31/18  6:53 AM  Result Value Ref Range   Color, Urine YELLOW YELLOW   APPearance CLEAR CLEAR   Specific Gravity, Urine 1.010 1.005 - 1.030   pH 7.0 5.0 - 8.0   Glucose, UA NEGATIVE NEGATIVE mg/dL   Hgb urine dipstick NEGATIVE NEGATIVE   Bilirubin Urine SMALL (A) NEGATIVE   Ketones, ur 15 (A) NEGATIVE mg/dL   Protein, ur NEGATIVE NEGATIVE mg/dL   Nitrite NEGATIVE NEGATIVE   Leukocytes, UA NEGATIVE NEGATIVE  Surgical pcr screen     Status: Abnormal   Collection Time: 07/31/18  1:23 PM  Result Value Ref Range   MRSA, PCR NEGATIVE NEGATIVE   Staphylococcus aureus POSITIVE (A) NEGATIVE   Imaging Studies: Ct Abdomen Pelvis W Contrast  Result Date: 07/31/2018 CLINICAL DATA:  Acute pelvic pain. EXAM: CT ABDOMEN AND PELVIS WITH CONTRAST TECHNIQUE: Multidetector CT imaging of the abdomen and pelvis was performed using the standard protocol following bolus administration of intravenous contrast. CONTRAST:  172mL ISOVUE-300 IOPAMIDOL (ISOVUE-300) INJECTION 61% COMPARISON:  None. FINDINGS: Lower chest: No acute abnormality. Hepatobiliary: No focal liver abnormality is seen. No gallstones, gallbladder wall thickening, or biliary dilatation. Pancreas: Unremarkable. No pancreatic ductal dilatation or surrounding inflammatory changes. Spleen: Normal in size without focal abnormality. Adrenals/Urinary Tract: Adrenal glands are unremarkable. Kidneys are normal, without renal calculi, focal lesion, or hydronephrosis. Bladder  is unremarkable. Stomach/Bowel: Lap band is noted which is in grossly good position. There is no evidence of bowel obstruction. The appendix is enlarged with surrounding inflammation consistent with appendicitis. Appendix: Location: Right lower quadrant. Diameter: 13 mm. Appendicolith: Yes. Mucosal hyper-enhancement: No. Extraluminal gas: No. Periappendiceal collection: No. Vascular/Lymphatic: No significant vascular findings are present. No enlarged abdominal or pelvic lymph nodes. Reproductive: Prostate is unremarkable. Other: Small fat containing periumbilical hernia is noted. No abnormal fluid collection is noted. Musculoskeletal: No acute or significant osseous findings. IMPRESSION: Findings consistent with acute appendicitis.  No abscess is noted. Electronically Signed   By: Marijo Conception, M.D.   On: 07/31/2018 07:14    ED COURSE and MDM  Nursing notes and initial vitals signs, including pulse oximetry, reviewed.  Vitals:   07/31/18 1900 07/31/18 1915 07/31/18 1930 07/31/18 1945  BP: (!) 158/86 (!) 140/94 (!) 148/86 (!) 144/91  Pulse: 72 66 62 (!) 57  Resp: 12 12 14 16   Temp: 98.6 F (37 C)   98.4 F (36.9 C)  TempSrc:      SpO2: 100% 99% 97% 98%  Weight:      Height:        PROCEDURES    ED DIAGNOSES     ICD-10-CM   1. Acute appendicitis, unspecified acute appendicitis type K35.80        Alizeh Madril, Jenny Reichmann, MD 07/31/18 2236

## 2018-07-31 NOTE — ED Triage Notes (Signed)
Pt states he has been dealing with sinus drainage for the past couple of weeks that has made him gag and bring up some mucous but last night he started having sharp abdominal pain and nausea  Denies vomiting or diarrhea  Pt states he had a steroid shot yesterday from his dermatologist

## 2018-07-31 NOTE — Anesthesia Preprocedure Evaluation (Signed)
Anesthesia Evaluation  Patient identified by MRN, date of birth, ID band Patient awake    Reviewed: Allergy & Precautions, NPO status , Patient's Chart, lab work & pertinent test results  Airway Mallampati: II  TM Distance: <3 FB Neck ROM: Full    Dental no notable dental hx.    Pulmonary sleep apnea ,    Pulmonary exam normal breath sounds clear to auscultation       Cardiovascular hypertension, Pt. on medications Normal cardiovascular exam Rhythm:Regular Rate:Normal     Neuro/Psych negative neurological ROS  negative psych ROS   GI/Hepatic Neg liver ROS, GERD  ,  Endo/Other  negative endocrine ROS  Renal/GU negative Renal ROS  negative genitourinary   Musculoskeletal negative musculoskeletal ROS (+)   Abdominal   Peds negative pediatric ROS (+)  Hematology negative hematology ROS (+)   Anesthesia Other Findings   Reproductive/Obstetrics negative OB ROS                             Anesthesia Physical Anesthesia Plan  ASA: II  Anesthesia Plan: General   Post-op Pain Management:    Induction: Intravenous  PONV Risk Score and Plan: 2 and Ondansetron, Dexamethasone and Treatment may vary due to age or medical condition  Airway Management Planned: Oral ETT  Additional Equipment:   Intra-op Plan:   Post-operative Plan: Extubation in OR  Informed Consent: I have reviewed the patients History and Physical, chart, labs and discussed the procedure including the risks, benefits and alternatives for the proposed anesthesia with the patient or authorized representative who has indicated his/her understanding and acceptance.   Dental advisory given  Plan Discussed with: CRNA and Surgeon  Anesthesia Plan Comments:         Anesthesia Quick Evaluation

## 2018-07-31 NOTE — ED Provider Notes (Signed)
7:45 AM Spoke with Dr Marlou Starks, General Surgery who accepts the patient to the [facility] ER for Acute Appendicitis. Pt updated. Transfer via private vehicle  I personally reviewed the imaging tests through PACS system I reviewed available ER/hospitalization records through the EMR  Ct Abdomen Pelvis W Contrast FINDINGS: Lower chest: No acute abnormality. Hepatobiliary: No focal liver abnormality is seen. No gallstones, gallbladder wall thickening, or biliary dilatation. Pancreas: Unremarkable. No pancreatic ductal dilatation or surrounding inflammatory changes. Spleen: Normal in size without focal abnormality. Adrenals/Urinary Tract: Adrenal glands are unremarkable. Kidneys are normal, without renal calculi, focal lesion, or hydronephrosis. Bladder is unremarkable. Stomach/Bowel: Lap band is noted which is in grossly good position. There is no evidence of bowel obstruction. The appendix is enlarged with surrounding inflammation consistent with appendicitis. Appendix: Location: Right lower quadrant. Diameter: 13 mm. Appendicolith: Yes. Mucosal hyper-enhancement: No. Extraluminal gas: No. Periappendiceal collection: No. Vascular/Lymphatic: No significant vascular findings are present. No enlarged abdominal or pelvic lymph nodes. Reproductive: Prostate is unremarkable. Other: Small fat containing periumbilical hernia is noted. No abnormal fluid collection is noted. Musculoskeletal: No acute or significant osseous findings. IMPRESSION: Findings consistent with acute appendicitis.  No abscess is noted.     Jola Schmidt, MD 07/31/18 [phone]

## 2018-07-31 NOTE — Transfer of Care (Signed)
Immediate Anesthesia Transfer of Care Note  Patient: Gilbert [name]  Procedure(s) Performed: Procedure(s): APPENDECTOMY LAPAROSCOPIC (N/A)  Patient Location: PACU  Anesthesia Type:General  Level of Consciousness:  sedated, patient cooperative and responds to stimulation  Airway & Oxygen Therapy:Patient Spontanous Breathing and Patient connected to face mask oxgen  Post-op Assessment:  Report given to PACU RN and Post -op Vital signs reviewed and stable  Post vital signs:  Reviewed and stable  Last Vitals:  Vitals:   07/31/18 1318 07/31/18 1705  BP: 137/88   Pulse: (!) 58   Resp: 20   Temp: 37.7 C 37.9 C  SpO2: 27%     Complications: No apparent anesthesia complications

## 2018-07-31 NOTE — Discharge Instructions (Signed)
[facility] LAPAROSCOPIC SURGERY: POST OP INSTRUCTIONS Always review your discharge instruction sheet given to you by the facility where your surgery was performed. IF YOU HAVE DISABILITY OR FAMILY LEAVE FORMS, YOU MUST BRING THEM TO THE OFFICE FOR PROCESSING.   DO NOT GIVE THEM TO YOUR DOCTOR.  PAIN CONTROL  1. First take acetaminophen (Tylenol) AND/or ibuprofen (Advil) to control your pain after surgery.  Follow directions on package.  Taking acetaminophen (Tylenol) and/or ibuprofen (Advil) regularly after surgery will help to control your pain and lower the amount of prescription pain medication you may need.  You should not take more than 3,000 mg (3 grams) of acetaminophen (Tylenol) in 24 hours.  You should not take ibuprofen (Advil), aleve, motrin, naprosyn or other NSAIDS if you have a history of stomach ulcers or chronic kidney disease.  2. A prescription for pain medication may be given to you upon discharge.  Take your pain medication as prescribed, if you still have uncontrolled pain after taking acetaminophen (Tylenol) or ibuprofen (Advil). 3. Use ice packs to help control pain. 4. If you need a refill on your pain medication, please contact your pharmacy.  They will contact our office to request authorization. Prescriptions will not be filled after 5pm or on week-ends.  HOME MEDICATIONS 5. Take your usually prescribed medications unless otherwise directed.  DIET 6. You should follow a light diet the first few days after arrival home.  Be sure to include lots of fluids daily. Avoid fatty, fried foods.   CONSTIPATION 7. It is common to experience some constipation after surgery and if you are taking pain medication.  Increasing fluid intake and taking a stool softener (such as Colace) will usually help or prevent this problem from occurring.  A mild laxative (Milk of Magnesia or Miralax) should be taken according to package instructions if there are no bowel  movements after 48 hours.  WOUND/INCISION CARE 8. Most patients will experience some swelling and bruising in the area of the incisions.  Ice packs will help.  Swelling and bruising can take several days to resolve.  9. Unless discharge instructions indicate otherwise, follow guidelines below  a. STERI-STRIPS - you may remove your outer bandages 48 hours after surgery, and you may shower at that time.  You have steri-strips (small skin tapes) in place directly over the incision.  These strips should be left on the skin for 7-10 days.   b. DERMABOND/SKIN GLUE - you may shower in 24 hours.  The glue will flake off over the next 2-3 weeks. 10. Any sutures or staples will be removed at the office during your follow-up visit.  ACTIVITIES 11. You may resume regular (light) daily activities beginning the next day--such as daily self-care, walking, climbing stairs--gradually increasing activities as tolerated.  You may have sexual intercourse when it is comfortable.  Refrain from any heavy lifting or straining until approved by your doctor. a. You may drive when you are no longer taking prescription pain medication, you can comfortably wear a seatbelt, and you can safely maneuver your car and apply brakes.  FOLLOW-UP 12. You should see your doctor in the office for a follow-up appointment approximately 2-3 weeks after your surgery.  You should have been given your post-op/follow-up appointment when your surgery was scheduled.  If you did not receive a post-op/follow-up appointment, make sure that you call for this appointment within a day or two after you arrive home to insure a convenient appointment time.  WHEN   TO CALL YOUR DOCTOR: 1. Fever over 101.0 2. Inability to urinate 3. Continued bleeding from incision. 4. Increased pain, redness, or drainage from the incision. 5. Increasing abdominal pain  The clinic staff is available to answer your questions during regular business hours.  Please don't  hesitate to call and ask to speak to one of the nurses for clinical concerns.  If you have a medical emergency, go to the nearest emergency room or call 911.  A surgeon from [facility] is always on call at the hospital. [address], [address], [place], [place]  [address] ? [address] [address], ,    [address] [phone] ? [phone] ? FAX [phone] Web site: [email or web address]  

## 2018-07-31 NOTE — Anesthesia Procedure Notes (Addendum)
Procedure Name: Intubation Date/Time: 07/31/2018 5:50 PM Performed by: Lissa Morales, CRNA Pre-anesthesia Checklist: Patient identified, Emergency Drugs available, Suction available and Patient being monitored Patient Re-evaluated:Patient Re-evaluated prior to induction Oxygen Delivery Method: Circle system utilized Preoxygenation: Pre-oxygenation with 100% oxygen Induction Type: IV induction, Rapid sequence and Cricoid Pressure applied Ventilation: Mask ventilation without difficulty Grade View: Grade II Tube type: Oral Tube size: 8.0 mm Number of attempts: 1 Airway Equipment and Method: Stylet and Oral airway Placement Confirmation: ETT inserted through vocal cords under direct vision,  positive ETCO2 and breath sounds checked- equal and bilateral Secured at: 22 cm Tube secured with: Tape Dental Injury: Teeth and Oropharynx as per pre-operative assessment

## 2018-07-31 NOTE — ED Notes (Signed)
ED Provider at bedside. 

## 2018-07-31 NOTE — ED Notes (Signed)
IV site wrapped with kling for protection.  Pt taken to POV in WC in NAD.  Stated the cool air made him feel better.  Wife driving.

## 2018-07-31 NOTE — H&P (Signed)
[facility] Admission Note  Gilbert [name] 1971/01/27  [id number].    Requesting MD: Alvino Chapel Chief Complaint/Reason for Consult: Acute appendicitis HPI:  Patient is a 47 year old male who presented to [facility] early this AM with lower abdominal pain that started yesterday evening. Pain characterized as sharp and constant, worse with movement. Patient denied fevers at home, nausea, vomiting, diarrhea, urinary symptoms, chest pain or SOB. Patient does report some URI symptoms for the past 3-4 days. PMH significant for HTN and OSA. Past surgical history significant for laparoscopic gastric band and hiatal hernia repair in 2013 by Dr. Hassell Done. Denies blood thinning medications. Allergic to PCNs. Patient works in [employer] and has a work trip to [employer]. [place] scheduled for 11/16.   Patient was transferred to [facility] hospital for surgical evaluation. CT scan showed dilated appendix with surrounding inflammation and appendicolith.   ROS: Review of Systems  Constitutional: Positive for malaise/fatigue. Negative for chills and fever.  HENT: Positive for congestion and sinus pain.   Respiratory: Negative for shortness of breath and wheezing.   Cardiovascular: Negative for chest pain and palpitations.  Gastrointestinal: Positive for abdominal pain. Negative for blood in stool, constipation, diarrhea, melena, nausea and vomiting.  Genitourinary: Negative for dysuria, frequency and urgency.  All other systems reviewed and are negative.   Family History  Problem Relation Age of Onset  . Hypertension Father   . Diabetes Father   . Heart disease Father        pacemaker    Past Medical History:  Diagnosis Date  . Chronic back pain   . GERD (gastroesophageal reflux disease)   . Hepatitis    unknown type many yrs ago - no residual problems  . Hypertension   . Insomnia   . Morbid obesity ([place])   . Ocular rosacea   . Sleep apnea    STOP BANG SCORE 6    Past Surgical History:  Procedure  Laterality Date  . HIATAL HERNIA REPAIR  04/23/2012   Procedure: HERNIA REPAIR HIATAL;  Surgeon: Pedro Earls, MD;  Location: [facility];  Service: General;  Laterality: N/A;  . KNEE SURGERY     right  . LAPAROSCOPIC GASTRIC BANDING  04/23/2012   Procedure: LAPAROSCOPIC GASTRIC BANDING;  Surgeon: Pedro Earls, MD;  Location: [facility];  Service: General;  Laterality: N/A;  . LASIK      Social History:  reports that he has never smoked. He has never used smokeless tobacco. He reports that he drinks alcohol. He reports that he does not use drugs.  Allergies:  Allergies  Allergen Reactions  . Penicillins Rash     (Not in a hospital admission)  Blood pressure 138/90, pulse (!) 59, temperature 98.6 F (37 C), temperature source Oral, resp. rate 16, height '6\' 3"'$  (1.905 m), weight 111.1 kg, SpO2 98 %. Physical Exam: Physical Exam  Constitutional: He is oriented to person, place, and time. He appears well-developed and well-nourished. He is cooperative.  Non-toxic appearance. No distress.  HENT:  Head: Normocephalic and atraumatic.  Right Ear: External ear normal.  Left Ear: External ear normal.  Nose: Nose normal.  Mouth/Throat: Oropharynx is clear and moist and mucous membranes are normal.  Eyes: Pupils are equal, round, and reactive to light. Conjunctivae, EOM and lids are normal. No scleral icterus.  Neck: Normal range of motion and phonation normal. Neck supple.  Cardiovascular: Normal rate and regular rhythm.  Pulses:      Radial pulses are 2+ on the  right side, and 2+ on the left side.       Dorsalis pedis pulses are 2+ on the right side, and 2+ on the left side.  Pulmonary/Chest: Effort normal and breath sounds normal.  Abdominal: Soft. Bowel sounds are normal. He exhibits no distension. There is no hepatosplenomegaly. There is tenderness in the right lower quadrant, suprapubic area and left lower quadrant. There is rebound (mild ) and tenderness at McBurney's point. There is no  rigidity and no guarding. No hernia.  Musculoskeletal:  No gross deformity in all 4 extremities. ROM grossly intact in all 4 extremities.   Neurological: He is alert and oriented to person, place, and time. He has normal strength. No sensory deficit.  Skin: Skin is warm, dry and intact. No rash noted. He is not diaphoretic.  Psychiatric: He has a normal mood and affect. His speech is normal and behavior is normal.    Results for orders placed or performed during the hospital encounter of 07/31/18 (from the past 48 hour(s))  CBC with Differential/Platelet     Status: Abnormal   Collection Time: 07/31/18  5:53 AM  Result Value Ref Range   WBC 10.9 (H) 4.0 - 10.5 K/uL   RBC 4.43 4.22 - 5.81 MIL/uL   Hemoglobin 14.9 13.0 - 17.0 g/dL   HCT 44.5 39.0 - 52.0 %   MCV 100.5 (H) 80.0 - 100.0 fL   MCH 33.6 26.0 - 34.0 pg   MCHC 33.5 30.0 - 36.0 g/dL   RDW 11.8 11.5 - 15.5 %   Platelets 216 150 - 400 K/uL   nRBC 0.0 0.0 - 0.2 %   Neutrophils Relative % 82 %   Neutro Abs 9.0 (H) 1.7 - 7.7 K/uL   Lymphocytes Relative 8 %   Lymphs Abs 0.9 0.7 - 4.0 K/uL   Monocytes Relative 8 %   Monocytes Absolute 0.9 0.1 - 1.0 K/uL   Eosinophils Relative 1 %   Eosinophils Absolute 0.1 0.0 - 0.5 K/uL   Basophils Relative 1 %   Basophils Absolute 0.1 0.0 - 0.1 K/uL   Immature Granulocytes 0 %   Abs Immature Granulocytes 0.02 0.00 - 0.07 K/uL    Comment: Performed at [facility], [address] [place], [place] [address]  Comprehensive metabolic panel     Status: Abnormal   Collection Time: 07/31/18  5:53 AM  Result Value Ref Range   Sodium 136 135 - 145 mmol/L   Potassium 3.6 3.5 - 5.1 mmol/L   Chloride 98 98 - 111 mmol/L   CO2 25 22 - 32 mmol/L   Glucose, Bld 111 (H) 70 - 99 mg/dL   BUN 12 6 - 20 mg/dL   Creatinine, Ser 0.80 0.61 - 1.24 mg/dL   Calcium 9.8 8.9 - 10.3 mg/dL   Total Protein 7.1 6.5 - 8.1 g/dL   Albumin 4.3 3.5 - 5.0 g/dL   AST 33 15 - 41 U/L   ALT 29 0 - 44 U/L    Alkaline Phosphatase 49 38 - 126 U/L   Total Bilirubin 1.0 0.3 - 1.2 mg/dL   GFR calc non Af Amer >60 >60 mL/min   GFR calc Af Amer >60 >60 mL/min    Comment: (NOTE) The eGFR has been calculated using the CKD EPI equation. This calculation has not been validated in all clinical situations. eGFR's persistently <60 mL/min signify possible Chronic Kidney Disease.    Anion gap 13 5 - 15    Comment: Performed  at [facility], [address] [place], [place] [address]  Lipase, blood     Status: None   Collection Time: 07/31/18  5:53 AM  Result Value Ref Range   Lipase 27 11 - 51 U/L    Comment: Performed at [facility], [address] [place], [place] [address]  Urinalysis, Routine w reflex microscopic     Status: Abnormal   Collection Time: 07/31/18  6:53 AM  Result Value Ref Range   Color, Urine YELLOW YELLOW   APPearance CLEAR CLEAR   Specific Gravity, Urine 1.010 1.005 - 1.030   pH 7.0 5.0 - 8.0   Glucose, UA NEGATIVE NEGATIVE mg/dL   Hgb urine dipstick NEGATIVE NEGATIVE   Bilirubin Urine SMALL (A) NEGATIVE   Ketones, ur 15 (A) NEGATIVE mg/dL   Protein, ur NEGATIVE NEGATIVE mg/dL   Nitrite NEGATIVE NEGATIVE   Leukocytes, UA NEGATIVE NEGATIVE    Comment: Microscopic not done on urines with negative protein, blood, leukocytes, nitrite, or glucose < 500 mg/dL. Performed at [facility], [address] [place], [place] [address]    Ct Abdomen Pelvis W Contrast  Result Date: 07/31/2018 CLINICAL DATA:  Acute pelvic pain. EXAM: CT ABDOMEN AND PELVIS WITH CONTRAST TECHNIQUE: Multidetector CT imaging of the abdomen and pelvis was performed using the standard protocol following bolus administration of intravenous contrast. CONTRAST:  151m ISOVUE-300 IOPAMIDOL (ISOVUE-300) INJECTION 61% COMPARISON:  None. FINDINGS: Lower chest: No acute abnormality. Hepatobiliary: No focal liver abnormality is seen. No gallstones, gallbladder wall thickening, or biliary  dilatation. Pancreas: Unremarkable. No pancreatic ductal dilatation or surrounding inflammatory changes. Spleen: Normal in size without focal abnormality. Adrenals/Urinary Tract: Adrenal glands are unremarkable. Kidneys are normal, without renal calculi, focal lesion, or hydronephrosis. Bladder is unremarkable. Stomach/Bowel: Lap band is noted which is in grossly good position. There is no evidence of bowel obstruction. The appendix is enlarged with surrounding inflammation consistent with appendicitis. Appendix: Location: Right lower quadrant. Diameter: 13 mm. Appendicolith: Yes. Mucosal hyper-enhancement: No. Extraluminal gas: No. Periappendiceal collection: No. Vascular/Lymphatic: No significant vascular findings are present. No enlarged abdominal or pelvic lymph nodes. Reproductive: Prostate is unremarkable. Other: Small fat containing periumbilical hernia is noted. No abnormal fluid collection is noted. Musculoskeletal: No acute or significant osseous findings. IMPRESSION: Findings consistent with acute appendicitis.  No abscess is noted. Electronically Signed   By: JMarijo Conception M.D.   On: 07/31/2018 07:14      Assessment/Plan Hx of lap gastric band - Martin 2013 HTN OSA  Acute appendicitis - CT: dilated appendix with periappendiceal inflammation and appendicolith - WBC: 10.9, low grade temp - exam/hx consistent with acute appendicitis - plan for laparoscopic appendectomy later today  [facility] 07/31/2018, 9:52 AM Pager: [phone] Consults: [phone]-Fri 7:00 am-4:30 pm Sat-Sun 7:00 am-11:30 am

## 2018-07-31 NOTE — ED Notes (Signed)
ED TO INPATIENT HANDOFF REPORT  Name/Age/Gender Gilbert [name] 47 y.o. male  Code Status    Code Status Orders  (From admission, onward)         Start     Ordered   07/31/18 0946  Full code  Continuous     07/31/18 0952        Code Status History    This patient has a current code status but no historical code status.      Home/SNF/Other Home  Chief Complaint abdominal pain  Level of Care/Admitting Diagnosis ED Disposition    ED Disposition Condition [facility] Area: [facility] [[id number]]  Level of Care: Med-Surg [16]  Diagnosis: Acute appendicitis [809983]  Admitting Physician: CCS, [place]  Attending Physician: CCS, MD [[id number]  PT Class (Do Not Modify): Observation [104]  PT Acc Code (Do Not Modify): Observation [10022]       Medical History Past Medical History:  Diagnosis Date  . Chronic back pain   . GERD (gastroesophageal reflux disease)   . Hepatitis    unknown type many yrs ago - no residual problems  . Hypertension   . Insomnia   . Morbid obesity ([place])   . Ocular rosacea   . Sleep apnea    STOP BANG SCORE 6    Allergies Allergies  Allergen Reactions  . Penicillins Rash    Has patient had a PCN reaction causing immediate rash, facial/tongue/throat swelling, SOB or lightheadedness with hypotension: Unknown Has patient had a PCN reaction causing severe rash involving mucus membranes or skin necrosis: Unknown Has patient had a PCN reaction that required hospitalization: Unknown Has patient had a PCN reaction occurring within the last 10 years: No If all of the above answers are "NO", then may proceed with Cephalosporin use.     IV Location/Drains/Wounds Patient Lines/Drains/Airways Status   Active Line/Drains/Airways    Name:   Placement date:   Placement time:   Site:   Days:   Peripheral IV 07/31/18 Left Antecubital   07/31/18    0603    Antecubital   less than 1   Incision 04/23/12 Abdomen    04/23/12    1406     2290   Incision - 6 Ports Abdomen 1: Medial 2: Mid;Medial 3: Upper;Medial 4: Left 5: Left 6: Right   04/23/12    -     2290          Labs/Imaging Results for orders placed or performed during the hospital encounter of 07/31/18 (from the past 48 hour(s))  CBC with Differential/Platelet     Status: Abnormal   Collection Time: 07/31/18  5:53 AM  Result Value Ref Range   WBC 10.9 (H) 4.0 - 10.5 K/uL   RBC 4.43 4.22 - 5.81 MIL/uL   Hemoglobin 14.9 13.0 - 17.0 g/dL   HCT 44.5 39.0 - 52.0 %   MCV 100.5 (H) 80.0 - 100.0 fL   MCH 33.6 26.0 - 34.0 pg   MCHC 33.5 30.0 - 36.0 g/dL   RDW 11.8 11.5 - 15.5 %   Platelets 216 150 - 400 K/uL   nRBC 0.0 0.0 - 0.2 %   Neutrophils Relative % 82 %   Neutro Abs 9.0 (H) 1.7 - 7.7 K/uL   Lymphocytes Relative 8 %   Lymphs Abs 0.9 0.7 - 4.0 K/uL   Monocytes Relative 8 %   Monocytes Absolute 0.9 0.1 - 1.0 K/uL   Eosinophils Relative  1 %   Eosinophils Absolute 0.1 0.0 - 0.5 K/uL   Basophils Relative 1 %   Basophils Absolute 0.1 0.0 - 0.1 K/uL   Immature Granulocytes 0 %   Abs Immature Granulocytes 0.02 0.00 - 0.07 K/uL    Comment: Performed at [facility], [address] [place], [place] [address]  Comprehensive metabolic panel     Status: Abnormal   Collection Time: 07/31/18  5:53 AM  Result Value Ref Range   Sodium 136 135 - 145 mmol/L   Potassium 3.6 3.5 - 5.1 mmol/L   Chloride 98 98 - 111 mmol/L   CO2 25 22 - 32 mmol/L   Glucose, Bld 111 (H) 70 - 99 mg/dL   BUN 12 6 - 20 mg/dL   Creatinine, Ser 0.80 0.61 - 1.24 mg/dL   Calcium 9.8 8.9 - 10.3 mg/dL   Total Protein 7.1 6.5 - 8.1 g/dL   Albumin 4.3 3.5 - 5.0 g/dL   AST 33 15 - 41 U/L   ALT 29 0 - 44 U/L   Alkaline Phosphatase 49 38 - 126 U/L   Total Bilirubin 1.0 0.3 - 1.2 mg/dL   GFR calc non Af Amer >60 >60 mL/min   GFR calc Af Amer >60 >60 mL/min    Comment: (NOTE) The eGFR has been calculated using the CKD EPI equation. This calculation has not been  validated in all clinical situations. eGFR's persistently <60 mL/min signify possible Chronic Kidney Disease.    Anion gap 13 5 - 15    Comment: Performed at [facility], [address] [place], [place] [address]  Lipase, blood     Status: None   Collection Time: 07/31/18  5:53 AM  Result Value Ref Range   Lipase 27 11 - 51 U/L    Comment: Performed at [facility], [address] [place], [place] [address]  Urinalysis, Routine w reflex microscopic     Status: Abnormal   Collection Time: 07/31/18  6:53 AM  Result Value Ref Range   Color, Urine YELLOW YELLOW   APPearance CLEAR CLEAR   Specific Gravity, Urine 1.010 1.005 - 1.030   pH 7.0 5.0 - 8.0   Glucose, UA NEGATIVE NEGATIVE mg/dL   Hgb urine dipstick NEGATIVE NEGATIVE   Bilirubin Urine SMALL (A) NEGATIVE   Ketones, ur 15 (A) NEGATIVE mg/dL   Protein, ur NEGATIVE NEGATIVE mg/dL   Nitrite NEGATIVE NEGATIVE   Leukocytes, UA NEGATIVE NEGATIVE    Comment: Microscopic not done on urines with negative protein, blood, leukocytes, nitrite, or glucose < 500 mg/dL. Performed at [facility], [address] [place], [place] [address]    Ct Abdomen Pelvis W Contrast  Result Date: 07/31/2018 CLINICAL DATA:  Acute pelvic pain. EXAM: CT ABDOMEN AND PELVIS WITH CONTRAST TECHNIQUE: Multidetector CT imaging of the abdomen and pelvis was performed using the standard protocol following bolus administration of intravenous contrast. CONTRAST:  151m ISOVUE-300 IOPAMIDOL (ISOVUE-300) INJECTION 61% COMPARISON:  None. FINDINGS: Lower chest: No acute abnormality. Hepatobiliary: No focal liver abnormality is seen. No gallstones, gallbladder wall thickening, or biliary dilatation. Pancreas: Unremarkable. No pancreatic ductal dilatation or surrounding inflammatory changes. Spleen: Normal in size without focal abnormality. Adrenals/Urinary Tract: Adrenal glands are unremarkable. Kidneys are normal, without renal calculi, focal  lesion, or hydronephrosis. Bladder is unremarkable. Stomach/Bowel: Lap band is noted which is in grossly good position. There is no evidence of bowel obstruction. The appendix is enlarged  with surrounding inflammation consistent with appendicitis. Appendix: Location: Right lower quadrant. Diameter: 13 mm. Appendicolith: Yes. Mucosal hyper-enhancement: No. Extraluminal gas: No. Periappendiceal collection: No. Vascular/Lymphatic: No significant vascular findings are present. No enlarged abdominal or pelvic lymph nodes. Reproductive: Prostate is unremarkable. Other: Small fat containing periumbilical hernia is noted. No abnormal fluid collection is noted. Musculoskeletal: No acute or significant osseous findings. IMPRESSION: Findings consistent with acute appendicitis.  No abscess is noted. Electronically Signed   By: Marijo Conception, M.D.   On: 07/31/2018 07:14    Pending Labs Unresulted Labs (From admission, onward)    Start     Ordered   07/31/18 0946  HIV antibody (Routine Testing)  Once,   R     07/31/18 0952          Vitals/Pain Today's Vitals   07/31/18 0543 07/31/18 0654 07/31/18 0742 07/31/18 0802  BP: (!) 133/99  138/90   Pulse: 70  (!) 59   Resp: 18  16   Temp: 98.6 F (37 C)     TempSrc: Oral  Oral   SpO2: 100%  98%   Weight:      Height:      PainSc:  _0 Isolation Precautions No active isolations  Medications Medications  dextrose 5 % and 0.45 % NaCl with KCl 20 mEq/L infusion (has no administration in time range)  cefTRIAXone (ROCEPHIN) 2 g in sodium chloride 0.9 % 100 mL IVPB (has no administration in time range)    And  metroNIDAZOLE (FLAGYL) IVPB 500 mg (has no administration in time range)  oxyCODONE (Oxy IR/ROXICODONE) immediate release tablet 5-10 mg (has no administration in time range)  morphine 2 MG/ML injection 1-2 mg (has no administration in time range)  acetaminophen (TYLENOL) tablet 650 mg (has no administration in time range)    Or   acetaminophen (TYLENOL) suppository 650 mg (has no administration in time range)  diphenhydrAMINE (BENADRYL) capsule 25 mg (has no administration in time range)    Or  diphenhydrAMINE (BENADRYL) injection 25 mg (has no administration in time range)  ondansetron (ZOFRAN-ODT) disintegrating tablet 4 mg (has no administration in time range)    Or  ondansetron (ZOFRAN) injection 4 mg (has no administration in time range)  hydrALAZINE (APRESOLINE) injection 10 mg (has no administration in time range)  enalapril (VASOTEC) tablet 40 mg (has no administration in time range)  0.9 %  sodium chloride infusion ( Intravenous Stopped 07/31/18 0757)  ondansetron (ZOFRAN) injection 4 mg (4 mg Intravenous Given 07/31/18 0606)  fentaNYL (SUBLIMAZE) injection 100 mcg (100 mcg Intravenous Given 07/31/18 0607)  iopamidol (ISOVUE-300) 61 % injection 100 mL (100 mLs Intravenous Contrast Given 07/31/18 0631)  morphine 4 MG/ML injection 4 mg (4 mg Intravenous Given 07/31/18 0751)  ondansetron (ZOFRAN-ODT) disintegrating tablet 8 mg (8 mg Oral Given 07/31/18 0809)    Mobility walks

## 2018-08-01 ENCOUNTER — Encounter (HOSPITAL_COMMUNITY): Payer: Self-pay | Admitting: General Surgery

## 2018-08-01 LAB — HIV ANTIBODY (ROUTINE TESTING W REFLEX): HIV Screen 4th Generation wRfx: NONREACTIVE

## 2018-08-01 MED ORDER — CIPROFLOXACIN HCL 500 MG PO TABS: 500.0000 mg | ORAL_TABLET | Freq: Two times a day (BID) | ORAL | 0 refills | Status: DC

## 2018-08-01 MED ORDER — METRONIDAZOLE 500 MG PO TABS: 500.0000 mg | ORAL_TABLET | Freq: Two times a day (BID) | ORAL | 0 refills | Status: AC

## 2018-08-01 NOTE — Progress Notes (Signed)
Pt was discharged home today. Instructions were reviewed with patient, and questions were answered.Prescriptions were given to the patient.  Pt was taken to main entrance via wheelchair by NT.

## 2018-08-01 NOTE — Discharge Summary (Signed)
[facility] Discharge Summary   Patient ID: Gilbert [name] MRN: [id number] DOB/AGE: [birth year] 47 y.o.  Admit date: 07/31/2018 Discharge date: 08/01/2018  Admitting Diagnosis: Acute appendicitis  Discharge Diagnosis Acute appendicitis  Consultants None  Imaging: Ct Abdomen Pelvis W Contrast  Result Date: 07/31/2018 CLINICAL DATA:  Acute pelvic pain. EXAM: CT ABDOMEN AND PELVIS WITH CONTRAST TECHNIQUE: Multidetector CT imaging of the abdomen and pelvis was performed using the standard protocol following bolus administration of intravenous contrast. CONTRAST:  139mL ISOVUE-300 IOPAMIDOL (ISOVUE-300) INJECTION 61% COMPARISON:  None. FINDINGS: Lower chest: No acute abnormality. Hepatobiliary: No focal liver abnormality is seen. No gallstones, gallbladder wall thickening, or biliary dilatation. Pancreas: Unremarkable. No pancreatic ductal dilatation or surrounding inflammatory changes. Spleen: Normal in size without focal abnormality. Adrenals/Urinary Tract: Adrenal glands are unremarkable. Kidneys are normal, without renal calculi, focal lesion, or hydronephrosis. Bladder is unremarkable. Stomach/Bowel: Lap band is noted which is in grossly good position. There is no evidence of bowel obstruction. The appendix is enlarged with surrounding inflammation consistent with appendicitis. Appendix: Location: Right lower quadrant. Diameter: 13 mm. Appendicolith: Yes. Mucosal hyper-enhancement: No. Extraluminal gas: No. Periappendiceal collection: No. Vascular/Lymphatic: No significant vascular findings are present. No enlarged abdominal or pelvic lymph nodes. Reproductive: Prostate is unremarkable. Other: Small fat containing periumbilical hernia is noted. No abnormal fluid collection is noted. Musculoskeletal: No acute or significant osseous findings. IMPRESSION: Findings consistent with acute appendicitis.  No abscess is noted. Electronically Signed   By: Marijo Conception, M.D.   On: 07/31/2018  07:14    Procedures Dr. Marlou Starks (07/31/16) - Laparoscopic Appendectomy  Hospital Course:  47yr old male who presented to [facility] with lower abdominal pain.  Workup showed acute appendicitis.  Patient was admitted and underwent procedure listed above.  Tolerated procedure well and was transferred to the floor.  Diet was advanced as tolerated.  On POD1, the patient was voiding well, tolerating diet, ambulating well, pain well controlled, vital signs stable, incisions c/d/i and felt stable for discharge home.  Patient will follow up in our office in 2 weeks and knows to call with questions or concerns.  He will call to confirm appointment date/time.    Physical Exam: General:  Alert, NAD, pleasant, comfortable Abd:  Soft, ND, mild tenderness, incisions C/D/I  Allergies as of 08/01/2018      Reactions   Penicillins Rash   Has patient had a PCN reaction causing immediate rash, facial/tongue/throat swelling, SOB or lightheadedness with hypotension: Unknown Has patient had a PCN reaction causing severe rash involving mucus membranes or skin necrosis: Unknown Has patient had a PCN reaction that required hospitalization: Unknown Has patient had a PCN reaction occurring within the last 10 years: No If all of the above answers are "NO", then may proceed with Cephalosporin use.      Medication List    TAKE these medications   acetaminophen 325 MG tablet Commonly known as:  TYLENOL Take 2 tablets (650 mg total) by mouth every 6 (six) hours as needed for mild pain (or temp > 100).   ciprofloxacin 500 MG tablet Commonly known as:  CIPRO Take 1 tablet (500 mg total) by mouth 2 (two) times daily.   enalapril 20 MG tablet Commonly known as:  VASOTEC TAKE 2 TABLETS (40 MG TOTAL) BY MOUTH DAILY.   fluticasone 50 MCG/ACT nasal spray Commonly known as:  FLONASE Place 2 sprays into both nostrils daily.   ibuprofen 200 MG tablet Commonly known as:  ADVIL,MOTRIN Take 400 mg by  mouth every 6 (six) hours  as needed. For back pain   Melatonin 10 MG Tabs Take 1 tablet by mouth at bedtime as needed (sleep).   metroNIDAZOLE 500 MG tablet Commonly known as:  FLAGYL Take 1 tablet (500 mg total) by mouth 2 (two) times daily with a meal for 5 days. DO NOT CONSUME ALCOHOL WHILE TAKING THIS MEDICATION.   oxyCODONE 5 MG immediate release tablet Commonly known as:  Oxy IR/ROXICODONE Take 1 tablet (5 mg total) by mouth every 4 (four) hours as needed for severe pain.   pramoxine-hydrocortisone 1-1 % rectal cream Commonly known as:  PROCTOCREAM-HC Place 1 application rectally 2 (two) times daily.   TUCKS 50 % Pads Clean rectal area twice each day and after stooling with pad.        Follow-up Information    [facility]. Go on 08/15/2018.   Specialty:  General Surgery Why:  Appointment scheduled for 1:30 PM. Please arrive 30 min prior to appointment time. Bring photo ID and insurance information.  Contact information: [address] [place] [address] [place] [phone]           Signed: Vita Barley, PA-S 08/01/2018, 12:16 PM

## 2018-08-23 DIAGNOSIS — M79671 Pain in right foot: Secondary | ICD-10-CM | POA: Diagnosis not present

## 2018-08-23 DIAGNOSIS — M7731 Calcaneal spur, right foot: Secondary | ICD-10-CM | POA: Diagnosis not present

## 2018-08-27 ENCOUNTER — Other Ambulatory Visit: Payer: Self-pay | Admitting: Family Medicine

## 2018-08-27 DIAGNOSIS — M109 Gout, unspecified: Secondary | ICD-10-CM | POA: Diagnosis not present

## 2018-08-27 NOTE — Telephone Encounter (Signed)
Copied from [place] [phone]. Topic: Quick Communication - Rx Refill/Question >> Aug 27, 2018 10:16 AM Gilbert [name] wrote: Medication: enalapril (VASOTEC) 20 MG tablet / Pharmacy advised pt to contact practice / Pt als stated he used to see Gilbert [name] but is now being seen by Gilbert [name] at [place].  Has the patient contacted their pharmacy? Yes.   (Agent: If no, request that the patient contact the pharmacy for the refill.) (Agent: If yes, when and what did the pharmacy advise?)  Preferred Pharmacy (with phone number or street name): [id number] - [place], [place] - [address] [place] [phone] (Phone) [phone] (Fax)    Agent: Please be advised that RX refills may take up to 3 business days. We ask that you follow-up with your pharmacy.

## 2018-08-27 NOTE — Telephone Encounter (Signed)
LOV was 03-08-18 with Dr. Ethelene Hal but not for this medication / Patient does have an upcoming appointment on 09-12-2018 / Can the vasotec be refilled?  It was previously prescribed by Dr. Birdie Riddle

## 2018-08-28 MED ORDER — ENALAPRIL MALEATE 20 MG PO TABS: 40.0000 mg | ORAL_TABLET | Freq: Every day | ORAL | 0 refills | Status: DC

## 2018-09-02 DIAGNOSIS — M722 Plantar fascial fibromatosis: Secondary | ICD-10-CM | POA: Diagnosis not present

## 2018-09-11 ENCOUNTER — Ambulatory Visit (HOSPITAL_COMMUNITY)
Admission: RE | Admit: 2018-09-11 | Discharge: 2018-09-11 | Disposition: A | Discharge status: Home / Self Care | Payer: BLUE CROSS/BLUE SHIELD | Source: Ambulatory Visit | Attending: Orthopaedic Surgery | Admitting: Orthopaedic Surgery

## 2018-09-11 ENCOUNTER — Other Ambulatory Visit (HOSPITAL_COMMUNITY): Payer: Self-pay | Admitting: Orthopaedic Surgery

## 2018-09-11 DIAGNOSIS — M7989 Other specified soft tissue disorders: Secondary | ICD-10-CM | POA: Diagnosis not present

## 2018-09-11 DIAGNOSIS — M79604 Pain in right leg: Secondary | ICD-10-CM | POA: Diagnosis not present

## 2018-09-11 DIAGNOSIS — M19071 Primary osteoarthritis, right ankle and foot: Secondary | ICD-10-CM | POA: Diagnosis not present

## 2018-09-11 DIAGNOSIS — M25571 Pain in right ankle and joints of right foot: Secondary | ICD-10-CM | POA: Diagnosis not present

## 2018-09-11 DIAGNOSIS — M109 Gout, unspecified: Secondary | ICD-10-CM | POA: Diagnosis not present

## 2018-09-11 NOTE — Progress Notes (Signed)
Right Lower extremity venous duplex completed - Preliminary results. There is no evidence of a DVT or Baker's cyst. Full preliminary results are found in chart review CV Proc. [facility], [place] 09/11/2018, 11:53 AM

## 2018-09-12 ENCOUNTER — Encounter: Payer: BLUE CROSS/BLUE SHIELD | Admitting: Family Medicine

## 2018-09-13 DIAGNOSIS — M25571 Pain in right ankle and joints of right foot: Secondary | ICD-10-CM | POA: Diagnosis not present

## 2018-09-13 DIAGNOSIS — M79671 Pain in right foot: Secondary | ICD-10-CM | POA: Diagnosis not present

## 2018-09-19 DIAGNOSIS — M25571 Pain in right ankle and joints of right foot: Secondary | ICD-10-CM | POA: Diagnosis not present

## 2018-09-20 ENCOUNTER — Ambulatory Visit: Payer: BLUE CROSS/BLUE SHIELD | Admitting: Family Medicine

## 2018-09-20 ENCOUNTER — Encounter: Payer: Self-pay | Admitting: Family Medicine

## 2018-09-20 VITALS — BP 118/70 | HR 81 | Ht 75.0 in | Wt 235.5 lb

## 2018-09-20 DIAGNOSIS — Z789 Other specified health status: Secondary | ICD-10-CM | POA: Insufficient documentation

## 2018-09-20 DIAGNOSIS — G8929 Other chronic pain: Secondary | ICD-10-CM

## 2018-09-20 DIAGNOSIS — Z7289 Other problems related to lifestyle: Secondary | ICD-10-CM

## 2018-09-20 DIAGNOSIS — M25571 Pain in right ankle and joints of right foot: Secondary | ICD-10-CM | POA: Diagnosis not present

## 2018-09-20 DIAGNOSIS — F1090 Alcohol use, unspecified, uncomplicated: Secondary | ICD-10-CM | POA: Insufficient documentation

## 2018-09-20 DIAGNOSIS — G4700 Insomnia, unspecified: Secondary | ICD-10-CM

## 2018-09-20 DIAGNOSIS — E559 Vitamin D deficiency, unspecified: Secondary | ICD-10-CM

## 2018-09-20 DIAGNOSIS — I10 Essential (primary) hypertension: Secondary | ICD-10-CM

## 2018-09-20 DIAGNOSIS — Z0001 Encounter for general adult medical examination with abnormal findings: Secondary | ICD-10-CM | POA: Diagnosis not present

## 2018-09-20 DIAGNOSIS — R739 Hyperglycemia, unspecified: Secondary | ICD-10-CM

## 2018-09-20 HISTORY — DX: Other chronic pain: G89.29

## 2018-09-20 HISTORY — DX: Other specified health status: Z78.9

## 2018-09-20 HISTORY — DX: Hyperglycemia, unspecified: R73.9

## 2018-09-20 HISTORY — DX: Alcohol use, unspecified, uncomplicated: F10.90

## 2018-09-20 LAB — COMPREHENSIVE METABOLIC PANEL
ALBUMIN: 4.2 g/dL (ref 3.5–5.2)
ALK PHOS: 62 U/L (ref 39–117)
ALT: 8 U/L (ref 0–53)
AST: 12 U/L (ref 0–37)
BILIRUBIN TOTAL: 0.4 mg/dL (ref 0.2–1.2)
BUN: 8 mg/dL (ref 6–23)
CALCIUM: 9 mg/dL (ref 8.4–10.5)
CO2: 27 meq/L (ref 19–32)
CREATININE: 0.71 mg/dL (ref 0.40–1.50)
Chloride: 102 mEq/L (ref 96–112)
GFR: 125.74 mL/min (ref >60.00)
Glucose, Bld: 78 mg/dL (ref 70–99)
Potassium: 4.1 mEq/L (ref 3.5–5.1)
Sodium: 140 mEq/L (ref 135–145)
TOTAL PROTEIN: 6.6 g/dL (ref 6.0–8.3)

## 2018-09-20 LAB — LIPID PANEL
CHOL/HDL RATIO: 2
Cholesterol: 174 mg/dL (ref 0–200)
HDL: 86.4 mg/dL (ref >39.00)
LDL Cholesterol: 79 mg/dL (ref 0–99)
NonHDL: 87.51
Triglycerides: 45 mg/dL (ref 0.0–149.0)
VLDL: 9 mg/dL (ref 0.0–40.0)

## 2018-09-20 LAB — URINALYSIS, ROUTINE W REFLEX MICROSCOPIC
BILIRUBIN URINE: NEGATIVE
Hgb urine dipstick: NEGATIVE
KETONES UR: NEGATIVE
LEUKOCYTES UA: NEGATIVE
Nitrite: NEGATIVE
PH: 6 (ref 5.0–8.0)
RBC / HPF: NONE SEEN (ref 0–?)
SPECIFIC GRAVITY, URINE: 1.015 (ref 1.000–1.030)
Total Protein, Urine: NEGATIVE
URINE GLUCOSE: NEGATIVE
UROBILINOGEN UA: 0.2 (ref 0.0–1.0)
WBC, UA: NONE SEEN (ref 0–?)

## 2018-09-20 LAB — CBC
HCT: 40.6 % (ref 39.0–52.0)
HEMOGLOBIN: 13.8 g/dL (ref 13.0–17.0)
MCHC: 34.1 g/dL (ref 30.0–36.0)
MCV: 98.3 fl (ref 78.0–100.0)
Platelets: 398 10*3/uL (ref 150.0–400.0)
RBC: 4.13 Mil/uL — AB (ref 4.22–5.81)
RDW: 12.3 % (ref 11.5–15.5)
WBC: 6.9 10*3/uL (ref 4.0–10.5)

## 2018-09-20 LAB — MICROALBUMIN / CREATININE URINE RATIO
Creatinine,U: 120.1 mg/dL
Microalb Creat Ratio: 0.6 mg/g (ref 0.0–30.0)
Microalb, Ur: <0.7 mg/dL (ref 0.0–1.9)

## 2018-09-20 LAB — GAMMA GT: GGT: 20 U/L (ref 7–51)

## 2018-09-20 LAB — VITAMIN D 25 HYDROXY (VIT D DEFICIENCY, FRACTURES): VITD: 20.07 ng/mL — ABNORMAL LOW (ref 30.00–100.00)

## 2018-09-20 LAB — HEMOGLOBIN A1C: Hgb A1c MFr Bld: 5.1 % (ref 4.6–6.5)

## 2018-09-20 MED ORDER — ENALAPRIL MALEATE 20 MG PO TABS: 40.0000 mg | ORAL_TABLET | Freq: Every day | ORAL | 0 refills | Status: DC

## 2018-09-20 MED ORDER — TRAZODONE HCL 50 MG PO TABS: 25.0000 mg | ORAL_TABLET | Freq: Every evening | ORAL | 1 refills | Status: DC | PRN

## 2018-09-20 NOTE — Progress Notes (Addendum)
Established Patient Office Visit  Subjective:  Patient ID: Gilbert [name], male    DOB: Nov 20, 1970  Age: 47 y.o. MRN: [id number]  CC:  Chief Complaint  Patient presents with  . Annual Exam    HPI Gilbert [name] presents for patient is here for physical and is to discuss multiple medical issues and concerns.  Blood pressures been well controlled with the Vasotec at 40 mg daily.  He recently had an appendectomy back in November and has recovered well from that procedure.  He is been able to lose a little bit of weight.  He is having chronic right ankle pain and and an arthroscopy has been recommended.  His ankle pain is limiting his ability to exercise.  He is not sleeping well.  He admits to being a little bit sad.  He is worried about his health.  He is fasting today.  His father lived to age 45 and had suffered from COPD.  His mother is 6 and doing well.  He lives with his wife and 87 and 39 year old boys.  Recent abdominal CT was negative except for acute appendicitis.  Patient does not smoke or use illicit drugs.  When asked about alcohol usage he did admit that it could be a problem and his wife is concerned.  Past Medical History:  Diagnosis Date  . Chronic back pain   . GERD (gastroesophageal reflux disease)   . Hepatitis    unknown type many yrs ago - no residual problems  . Hypertension   . Insomnia   . Morbid obesity ([place])   . Ocular rosacea   . Sleep apnea    STOP BANG SCORE 6    Past Surgical History:  Procedure Laterality Date  . HIATAL HERNIA REPAIR  04/23/2012   Procedure: HERNIA REPAIR HIATAL;  Surgeon: Pedro Earls, MD;  Location: [facility];  Service: General;  Laterality: N/A;  . KNEE SURGERY     right  . LAPAROSCOPIC APPENDECTOMY N/A 07/31/2018   Procedure: APPENDECTOMY LAPAROSCOPIC;  Surgeon: Jovita Kussmaul, MD;  Location: [facility];  Service: General;  Laterality: N/A;  . LAPAROSCOPIC GASTRIC BANDING  04/23/2012   Procedure: LAPAROSCOPIC GASTRIC BANDING;   Surgeon: Pedro Earls, MD;  Location: [facility];  Service: General;  Laterality: N/A;  . LASIK      Family History  Problem Relation Age of Onset  . Hypertension Father   . Diabetes Father   . Heart disease Father        pacemaker    Social History   Socioeconomic History  . Marital status: Married    Spouse name: Not on file  . Number of children: Not on file  . Years of education: Not on file  . Highest education level: Not on file  Occupational History  . Not on file  Social Needs  . Financial resource strain: Not on file  . Food insecurity:    Worry: Not on file    Inability: Not on file  . Transportation needs:    Medical: Not on file    Non-medical: Not on file  Tobacco Use  . Smoking status: Never Smoker  . Smokeless tobacco: Never Used  Substance and Sexual Activity  . Alcohol use: Yes    Comment: social  . Drug use: No  . Sexual activity: Not on file  Lifestyle  . Physical activity:    Days per week: Not on file    Minutes per session: Not on file  .  Stress: Not on file  Relationships  . Social connections:    Talks on phone: Not on file    Gets together: Not on file    Attends religious service: Not on file    Active member of club or organization: Not on file    Attends meetings of clubs or organizations: Not on file    Relationship status: Not on file  . Intimate partner violence:    Fear of current or ex partner: Not on file    Emotionally abused: Not on file    Physically abused: Not on file    Forced sexual activity: Not on file  Other Topics Concern  . Not on file  Social History Narrative  . Not on file    Outpatient Medications Prior to Visit  Medication Sig Dispense Refill  . enalapril (VASOTEC) 20 MG tablet Take 2 tablets (40 mg total) by mouth daily. 180 tablet 0  . acetaminophen (TYLENOL) 325 MG tablet Take 2 tablets (650 mg total) by mouth every 6 (six) hours as needed for mild pain (or temp > 100).    . ciprofloxacin (CIPRO)  500 MG tablet Take 1 tablet (500 mg total) by mouth 2 (two) times daily. 10 tablet 0  . fluticasone (FLONASE) 50 MCG/ACT nasal spray Place 2 sprays into both nostrils daily. 16 g 6  . ibuprofen (ADVIL,MOTRIN) 200 MG tablet Take 400 mg by mouth every 6 (six) hours as needed. For back pain    . Melatonin 10 MG TABS Take 1 tablet by mouth at bedtime as needed (sleep).    Marland Kitchen oxyCODONE (OXY IR/ROXICODONE) 5 MG immediate release tablet Take 1 tablet (5 mg total) by mouth every 4 (four) hours as needed for severe pain. 15 tablet 0  . pramoxine-hydrocortisone (PROCTOCREAM-HC) 1-1 % rectal cream Place 1 application rectally 2 (two) times daily. (Patient not taking: Reported on 07/31/2018) 30 g 0  . Witch Hazel (TUCKS) 50 % PADS Clean rectal area twice each day and after stooling with pad. (Patient not taking: Reported on 07/31/2018) 40 each 2   No facility-administered medications prior to visit.     Allergies  Allergen Reactions  . Penicillins Rash    Has patient had a PCN reaction causing immediate rash, facial/tongue/throat swelling, SOB or lightheadedness with hypotension: Unknown Has patient had a PCN reaction causing severe rash involving mucus membranes or skin necrosis: Unknown Has patient had a PCN reaction that required hospitalization: Unknown Has patient had a PCN reaction occurring within the last 10 years: No If all of the above answers are "NO", then may proceed with Cephalosporin use.     ROS Review of Systems  Constitutional: Negative for diaphoresis, fatigue, fever and unexpected weight change.  HENT: Negative.   Eyes: Negative for photophobia and visual disturbance.  Respiratory: Negative.   Cardiovascular: Negative.   Gastrointestinal: Negative.   Endocrine: Negative for polyphagia and polyuria.  Genitourinary: Negative for difficulty urinating, frequency and urgency.  Musculoskeletal: Positive for arthralgias and gait problem.  Skin: Negative for pallor and rash.    Neurological: Negative for seizures, speech difficulty and weakness.  Hematological: Does not bruise/bleed easily.  Psychiatric/Behavioral: Positive for sleep disturbance.   Depression screen [facility] 2/9 09/20/2018 09/10/2017 01/27/2016  Decreased Interest 1 0 0  Down, Depressed, Hopeless 1 0 0  PHQ - 2 Score 2 0 0  Altered sleeping 3 0 -  Tired, decreased energy 2 0 -  Change in appetite 1 0 -  Feeling bad or failure  about yourself  1 0 -  Trouble concentrating 0 0 -  Moving slowly or fidgety/restless 0 0 -  Suicidal thoughts 0 0 -  PHQ-9 Score 9 0 -  Difficult doing work/chores - Not difficult at all -      Objective:    Physical Exam  Constitutional: He is oriented to person, place, and time. He appears well-developed and well-nourished. No distress.  HENT:  Head: Normocephalic and atraumatic.  Right Ear: External ear normal.  Left Ear: External ear normal.  Mouth/Throat: Oropharynx is clear and moist. No oropharyngeal exudate.  Eyes: Pupils are equal, round, and reactive to light. Conjunctivae are normal. Right eye exhibits no discharge. Left eye exhibits no discharge. No scleral icterus.  Neck: Neck supple. No JVD present. No tracheal deviation present. No thyromegaly present.  Cardiovascular: Normal rate, regular rhythm and normal heart sounds.  Pulmonary/Chest: Effort normal and breath sounds normal. No stridor.  Abdominal: Soft. Bowel sounds are normal. He exhibits no distension. There is no abdominal tenderness. There is no rebound and no guarding. Hernia confirmed negative in the right inguinal area and confirmed negative in the left inguinal area.  Genitourinary: Right testis shows no mass, no swelling and no tenderness. Right testis is descended. Left testis shows no mass, no swelling and no tenderness. Left testis is descended. No hypospadias, penile erythema or penile tenderness. No discharge found.  Lymphadenopathy:    He has no cervical adenopathy.       Right: No  inguinal adenopathy present.       Left: No inguinal adenopathy present.  Neurological: He is alert and oriented to person, place, and time.  Skin: Skin is warm and dry. He is not diaphoretic.  Psychiatric: He has a normal mood and affect. His behavior is normal.    BP 118/70   Pulse 81   Ht 6\' 3"  (1.905 m)   Wt 235 lb 8 oz (106.8 kg)   SpO2 98%   BMI 29.44 kg/m  Wt Readings from Last 3 Encounters:  09/20/18 235 lb 8 oz (106.8 kg)  07/31/18 244 lb 11.4 oz (111 kg)  03/08/18 248 lb 2 oz (112.5 kg)   BP Readings from Last 3 Encounters:  09/20/18 118/70  08/01/18 135/86  03/08/18 124/90   Guideline developer:  UpToDate (see UpToDate for funding source) Date Released: June 2014  There are no preventive care reminders to display for this patient.  There are no preventive care reminders to display for this patient.  Lab Results  Component Value Date   TSH 0.92 09/10/2017   Lab Results  Component Value Date   WBC 10.9 (H) 07/31/2018   HGB 14.9 07/31/2018   HCT 44.5 07/31/2018   MCV 100.5 (H) 07/31/2018   PLT 216 07/31/2018   Lab Results  Component Value Date   NA 136 07/31/2018   K 3.6 07/31/2018   CO2 25 07/31/2018   GLUCOSE 111 (H) 07/31/2018   BUN 12 07/31/2018   CREATININE 0.80 07/31/2018   BILITOT 1.0 07/31/2018   ALKPHOS 49 07/31/2018   AST 33 07/31/2018   ALT 29 07/31/2018   PROT 7.1 07/31/2018   ALBUMIN 4.3 07/31/2018   CALCIUM 9.8 07/31/2018   ANIONGAP 13 07/31/2018   GFR 111.65 09/10/2017   Lab Results  Component Value Date   CHOL 189 09/10/2017   Lab Results  Component Value Date   HDL 121.00 09/10/2017   Lab Results  Component Value Date   LDLCALC 59 09/10/2017  Lab Results  Component Value Date   TRIG 46.0 09/10/2017   Lab Results  Component Value Date   CHOLHDL 2 09/10/2017   Lab Results  Component Value Date   HGBA1C 5.1 09/10/2017      Assessment & Plan:   Problem List Items Addressed This Visit      Cardiovascular  and Mediastinum   Essential hypertension   Relevant Medications   enalapril (VASOTEC) 20 MG tablet   Other Relevant Orders   CBC   Comprehensive metabolic panel   Urinalysis, Routine w reflex microscopic   Microalbumin / creatinine urine ratio     Other   Insomnia   Relevant Medications   traZODone (DESYREL) 50 MG tablet   Encounter for health maintenance examination with abnormal findings - Primary   Relevant Orders   CBC   Comprehensive metabolic panel   Lipid panel   VITAMIN D 25 Hydroxy (Vit-D Deficiency, Fractures)   Chronic pain of right ankle   Elevated blood sugar   Relevant Orders   Hemoglobin A1c   Alcohol use   Relevant Orders   Gamma GT      Meds ordered this encounter  Medications  . enalapril (VASOTEC) 20 MG tablet    Sig: Take 2 tablets (40 mg total) by mouth daily.    Dispense:  180 tablet    Refill:  0  . traZODone (DESYREL) 50 MG tablet    Sig: Take 0.5-1 tablets (25-50 mg total) by mouth at bedtime as needed for sleep.    Dispense:  30 tablet    Refill:  1    Follow-up: Return in about 2 months (around 11/21/2018).   Have asked patient to stop drinking completely and use the trazodone at night as needed for sleep instead of alcohol.  Have suggested that inserts will probably help in the management of his ankle pain.  He will follow-up with orthopedic surgeon as well for this issue.  Laboratory testing was essentially negative except his vitamin D was found to be low.  I discussed this with him.  He does agree to take a supplement weekly.  Have asked him to follow-up in 2 months.

## 2018-09-20 NOTE — Patient Instructions (Signed)
Health Maintenance, Male A healthy lifestyle and preventive care is important for your health and wellness. Ask your health care provider about what schedule of regular examinations is right for you. What should I know about weight and diet? Eat a Healthy Diet  Eat plenty of vegetables, fruits, whole grains, low-fat dairy products, and lean protein.  Do not eat a lot of foods high in solid fats, added sugars, or salt.  Maintain a Healthy Weight Regular exercise can help you achieve or maintain a healthy weight. You should:  Do at least 150 minutes of exercise each week. The exercise should increase your heart rate and make you sweat (moderate-intensity exercise).  Do strength-training exercises at least twice a week. Watch Your Levels of Cholesterol and Blood Lipids  Have your blood tested for lipids and cholesterol every 5 years starting at 48 years of age. If you are at high risk for heart disease, you should start having your blood tested when you are 48 years old. You may need to have your cholesterol levels checked more often if: ? Your lipid or cholesterol levels are high. ? You are older than 48 years of age. ? You are at high risk for heart disease. What should I know about cancer screening? Many types of cancers can be detected early and may often be prevented. Lung Cancer  You should be screened every year for lung cancer if: ? You are a current smoker who has smoked for at least 30 years. ? You are a former smoker who has quit within the past 15 years.  Talk to your health care provider about your screening options, when you should start screening, and how often you should be screened. Colorectal Cancer  Routine colorectal cancer screening usually begins at 48 years of age and should be repeated every 5-10 years until you are 48 years old. You may need to be screened more often if early forms of precancerous polyps or small growths are found. Your health care provider may  recommend screening at an earlier age if you have risk factors for colon cancer.  Your health care provider may recommend using home test kits to check for hidden blood in the stool.  A small camera at the end of a tube can be used to examine your colon (sigmoidoscopy or colonoscopy). This checks for the earliest forms of colorectal cancer. Prostate and Testicular Cancer  Depending on your age and overall health, your health care provider may do certain tests to screen for prostate and testicular cancer.  Talk to your health care provider about any symptoms or concerns you have about testicular or prostate cancer. Skin Cancer  Check your skin from head to toe regularly.  Tell your health care provider about any new moles or changes in moles, especially if: ? There is a change in a mole's size, shape, or color. ? You have a mole that is larger than a pencil eraser.  Always use sunscreen. Apply sunscreen liberally and repeat throughout the day.  Protect yourself by wearing long sleeves, pants, a wide-brimmed hat, and sunglasses when outside. What should I know about heart disease, diabetes, and high blood pressure?  If you are 18-39 years of age, have your blood pressure checked every 3-5 years. If you are 40 years of age or older, have your blood pressure checked every year. You should have your blood pressure measured twice-once when you are at a hospital or clinic, and once when you are not at a hospital   or clinic. Record the average of the two measurements. To check your blood pressure when you are not at a hospital or clinic, you can use: ? An automated blood pressure machine at a pharmacy. ? A home blood pressure monitor.  Talk to your health care provider about your target blood pressure.  If you are between 39-55 years old, ask your health care provider if you should take aspirin to prevent heart disease.  Have regular diabetes screenings by checking your fasting blood sugar  level. ? If you are at a normal weight and have a low risk for diabetes, have this test once every three years after the age of 10. ? If you are overweight and have a high risk for diabetes, consider being tested at a younger age or more often.  A one-time screening for abdominal aortic aneurysm (AAA) by ultrasound is recommended for men aged 1-75 years who are current or former smokers. What should I know about preventing infection? Hepatitis B If you have a higher risk for hepatitis B, you should be screened for this virus. Talk with your health care provider to find out if you are at risk for hepatitis B infection. Hepatitis C Blood testing is recommended for:  Everyone born from [birth year] through 1966.  Anyone with known risk factors for hepatitis C. Sexually Transmitted Diseases (STDs)  You should be screened each year for STDs including gonorrhea and chlamydia if: ? You are sexually active and are younger than 48 years of age. ? You are older than 48 years of age and your health care provider tells you that you are at risk for this type of infection. ? Your sexual activity has changed since you were last screened and you are at an increased risk for chlamydia or gonorrhea. Ask your health care provider if you are at risk.  Talk with your health care provider about whether you are at high risk of being infected with HIV. Your health care provider may recommend a prescription medicine to help prevent HIV infection. What else can I do?  Schedule regular health, dental, and eye exams.  Stay current with your vaccines (immunizations).  Do not use any tobacco products, such as cigarettes, chewing tobacco, and e-cigarettes. If you need help quitting, ask your health care provider.  Limit alcohol intake to no more than 2 drinks per day. One drink equals 12 ounces of beer, 5 ounces of wine, or 1 ounces of hard liquor.  Do not use street drugs.  Do not share needles.  Ask your health  care provider for help if you need support or information about quitting drugs.  Tell your health care provider if you often feel depressed.  Tell your health care provider if you have ever been abused or do not feel safe at home. This information is not intended to replace advice given to you by your health care provider. Make sure you discuss any questions you have with your health care provider. Document Released: 03/09/2008 Document Revised: 05/10/2016 Document Reviewed: 06/15/2015 Elsevier Interactive Patient Education  2019 [place].  Insomnia Insomnia is a sleep disorder that makes it difficult to fall asleep or stay asleep. Insomnia can cause fatigue, low energy, difficulty concentrating, mood swings, and poor performance at work or school. There are three different ways to classify insomnia:  Difficulty falling asleep.  Difficulty staying asleep.  Waking up too early in the morning. Any type of insomnia can be long-term (chronic) or short-term (acute). Both are common. Short-term  insomnia usually lasts for three months or less. Chronic insomnia occurs at least three times a week for longer than three months. What are the causes? Insomnia may be caused by another condition, situation, or substance, such as:  Anxiety.  Certain medicines.  Gastroesophageal reflux disease (GERD) or other gastrointestinal conditions.  Asthma or other breathing conditions.  Restless legs syndrome, sleep apnea, or other sleep disorders.  Chronic pain.  Menopause.  Stroke.  Abuse of alcohol, tobacco, or illegal drugs.  Mental health conditions, such as depression.  Caffeine.  Neurological disorders, such as Alzheimer's disease.  An overactive thyroid (hyperthyroidism). Sometimes, the cause of insomnia may not be known. What increases the risk? Risk factors for insomnia include:  Gender. Women are affected more often than men.  Age. Insomnia is more common as you get  older.  Stress.  Lack of exercise.  Irregular work schedule or working night shifts.  Traveling between different time zones.  Certain medical and mental health conditions. What are the signs or symptoms? If you have insomnia, the main symptom is having trouble falling asleep or having trouble staying asleep. This may lead to other symptoms, such as:  Feeling fatigued or having low energy.  Feeling nervous about going to sleep.  Not feeling rested in the morning.  Having trouble concentrating.  Feeling irritable, anxious, or depressed. How is this diagnosed? This condition may be diagnosed based on:  Your symptoms and medical history. Your health care provider may ask about: ? Your sleep habits. ? Any medical conditions you have. ? Your mental health.  A physical exam. How is this treated? Treatment for insomnia depends on the cause. Treatment may focus on treating an underlying condition that is causing insomnia. Treatment may also include:  Medicines to help you sleep.  Counseling or therapy.  Lifestyle adjustments to help you sleep better. Follow these instructions at home: Eating and drinking   Limit or avoid alcohol, caffeinated beverages, and cigarettes, especially close to bedtime. These can disrupt your sleep.  Do not eat a large meal or eat spicy foods right before bedtime. This can lead to digestive discomfort that can make it hard for you to sleep. Sleep habits   Keep a sleep diary to help you and your health care provider figure out what could be causing your insomnia. Write down: ? When you sleep. ? When you wake up during the night. ? How well you sleep. ? How rested you feel the next day. ? Any side effects of medicines you are taking. ? What you eat and drink.  Make your bedroom a dark, comfortable place where it is easy to fall asleep. ? Put up shades or blackout curtains to block light from outside. ? Use a white noise machine to block  noise. ? Keep the temperature cool.  Limit screen use before bedtime. This includes: ? Watching TV. ? Using your smartphone, tablet, or computer.  Stick to a routine that includes going to bed and waking up at the same times every day and night. This can help you fall asleep faster. Consider making a quiet activity, such as reading, part of your nighttime routine.  Try to avoid taking naps during the day so that you sleep better at night.  Get out of bed if you are still awake after 15 minutes of trying to sleep. Keep the lights down, but try reading or doing a quiet activity. When you feel sleepy, go back to bed. General instructions  Take over-the-counter and  prescription medicines only as told by your health care provider.  Exercise regularly, as told by your health care provider. Avoid exercise starting several hours before bedtime.  Use relaxation techniques to manage stress. Ask your health care provider to suggest some techniques that may work well for you. These may include: ? Breathing exercises. ? Routines to release muscle tension. ? Visualizing peaceful scenes.  Make sure that you drive carefully. Avoid driving if you feel very sleepy.  Keep all follow-up visits as told by your health care provider. This is important. Contact a health care provider if:  You are tired throughout the day.  You have trouble in your daily routine due to sleepiness.  You continue to have sleep problems, or your sleep problems get worse. Get help right away if:  You have serious thoughts about hurting yourself or someone else. If you ever feel like you may hurt yourself or others, or have thoughts about taking your own life, get help right away. You can go to your nearest emergency department or call:  Your local emergency services (911 in the [place]  A suicide crisis helpline, such as the [place] at 779-353-8126. This is open 24 hours a  day. Summary  Insomnia is a sleep disorder that makes it difficult to fall asleep or stay asleep.  Insomnia can be long-term (chronic) or short-term (acute).  Treatment for insomnia depends on the cause. Treatment may focus on treating an underlying condition that is causing insomnia.  Keep a sleep diary to help you and your health care provider figure out what could be causing your insomnia. This information is not intended to replace advice given to you by your health care provider. Make sure you discuss any questions you have with your health care provider. Document Released: 09/08/2000 Document Revised: 06/21/2017 Document Reviewed: 06/21/2017 Elsevier Interactive Patient Education  2019 [employer].

## 2018-09-23 DIAGNOSIS — E559 Vitamin D deficiency, unspecified: Secondary | ICD-10-CM

## 2018-09-23 HISTORY — DX: Vitamin D deficiency, unspecified: E55.9

## 2018-09-23 MED ORDER — VITAMIN D (ERGOCALCIFEROL) 1.25 MG (50000 UNIT) PO CAPS: 50000.0000 [IU] | ORAL_CAPSULE | ORAL | 0 refills | Status: DC

## 2018-09-23 NOTE — Addendum Note (Signed)
Addended by: Jon Billings on: 09/23/2018 09:12 AM   Modules accepted: Orders

## 2018-09-24 ENCOUNTER — Encounter (HOSPITAL_COMMUNITY): Payer: Self-pay | Admitting: *Deleted

## 2018-09-24 ENCOUNTER — Other Ambulatory Visit: Payer: Self-pay | Admitting: Orthopaedic Surgery

## 2018-09-24 ENCOUNTER — Other Ambulatory Visit: Payer: Self-pay

## 2018-09-24 NOTE — Progress Notes (Signed)
Gilbert [name] denies chest pain or shortness of breath.  PCP is Dr Abelino Derrick.  Patient does not see a cardiologist. I instructed patient to stop Ibuprofen, if no liver disease I informed patient that he could take Tylenol.

## 2018-09-26 ENCOUNTER — Encounter (HOSPITAL_COMMUNITY)
Admission: RE | Disposition: A | Discharge status: Home / Self Care | Payer: Self-pay | Source: Home / Self Care | Attending: Orthopaedic Surgery

## 2018-09-26 ENCOUNTER — Ambulatory Visit (HOSPITAL_COMMUNITY): Payer: BLUE CROSS/BLUE SHIELD | Admitting: Certified Registered Nurse Anesthetist

## 2018-09-26 ENCOUNTER — Encounter (HOSPITAL_COMMUNITY): Payer: Self-pay | Admitting: Certified Registered Nurse Anesthetist

## 2018-09-26 ENCOUNTER — Ambulatory Visit (HOSPITAL_COMMUNITY)
Admission: RE | Admit: 2018-09-26 | Discharge: 2018-09-26 | Disposition: A | Discharge status: Home / Self Care | Payer: BLUE CROSS/BLUE SHIELD | Attending: Orthopaedic Surgery | Admitting: Orthopaedic Surgery

## 2018-09-26 DIAGNOSIS — M24071 Loose body in right ankle: Secondary | ICD-10-CM | POA: Diagnosis not present

## 2018-09-26 DIAGNOSIS — M25871 Other specified joint disorders, right ankle and foot: Secondary | ICD-10-CM | POA: Diagnosis not present

## 2018-09-26 DIAGNOSIS — M659 Synovitis and tenosynovitis, unspecified: Secondary | ICD-10-CM | POA: Diagnosis not present

## 2018-09-26 DIAGNOSIS — M65871 Other synovitis and tenosynovitis, right ankle and foot: Secondary | ICD-10-CM | POA: Diagnosis not present

## 2018-09-26 DIAGNOSIS — G8929 Other chronic pain: Secondary | ICD-10-CM | POA: Diagnosis not present

## 2018-09-26 DIAGNOSIS — G8918 Other acute postprocedural pain: Secondary | ICD-10-CM | POA: Diagnosis not present

## 2018-09-26 DIAGNOSIS — Z9884 Bariatric surgery status: Secondary | ICD-10-CM | POA: Insufficient documentation

## 2018-09-26 DIAGNOSIS — G4700 Insomnia, unspecified: Secondary | ICD-10-CM | POA: Insufficient documentation

## 2018-09-26 DIAGNOSIS — Z6829 Body mass index (BMI) 29.0-29.9, adult: Secondary | ICD-10-CM | POA: Diagnosis not present

## 2018-09-26 DIAGNOSIS — M25771 Osteophyte, right ankle: Secondary | ICD-10-CM | POA: Diagnosis not present

## 2018-09-26 DIAGNOSIS — M93271 Osteochondritis dissecans, right ankle and joints of right foot: Secondary | ICD-10-CM | POA: Diagnosis not present

## 2018-09-26 DIAGNOSIS — I10 Essential (primary) hypertension: Secondary | ICD-10-CM | POA: Insufficient documentation

## 2018-09-26 DIAGNOSIS — M19071 Primary osteoarthritis, right ankle and foot: Secondary | ICD-10-CM | POA: Diagnosis not present

## 2018-09-26 DIAGNOSIS — Z880 Allergy status to penicillin: Secondary | ICD-10-CM | POA: Insufficient documentation

## 2018-09-26 DIAGNOSIS — Z79899 Other long term (current) drug therapy: Secondary | ICD-10-CM | POA: Diagnosis not present

## 2018-09-26 DIAGNOSIS — Z8249 Family history of ischemic heart disease and other diseases of the circulatory system: Secondary | ICD-10-CM | POA: Insufficient documentation

## 2018-09-26 HISTORY — PX: ANKLE ARTHROSCOPY WITH RECONSTRUCTION: SHX5583

## 2018-09-26 SURGERY — ARTHROSCOPY, ANKLE, WITH RECONSTRUCTION
Anesthesia: General | Site: Knee | Laterality: Right

## 2018-09-26 MED ORDER — BUPIVACAINE-EPINEPHRINE (PF) 0.5% -1:200000 IJ SOLN
INTRAMUSCULAR | Status: DC | PRN
  Administered 2018-09-26: 30 mL via PERINEURAL

## 2018-09-26 MED ORDER — FENTANYL CITRATE (PF) 250 MCG/5ML IJ SOLN
INTRAMUSCULAR | Status: DC | PRN
  Administered 2018-09-26 (×3): 25 ug via INTRAVENOUS

## 2018-09-26 MED ORDER — MEPERIDINE HCL 50 MG/ML IJ SOLN: 6.2500 mg | INTRAMUSCULAR | Status: DC | PRN

## 2018-09-26 MED ORDER — MIDAZOLAM HCL 2 MG/2ML IJ SOLN
INTRAMUSCULAR | Status: AC
  Administered 2018-09-26: 2 mg
  Filled 2018-09-26: qty 2

## 2018-09-26 MED ORDER — ONDANSETRON HCL 4 MG/2ML IJ SOLN
INTRAMUSCULAR | Status: AC
  Filled 2018-09-26: qty 2

## 2018-09-26 MED ORDER — LIDOCAINE 2% (20 MG/ML) 5 ML SYRINGE
INTRAMUSCULAR | Status: DC | PRN
  Administered 2018-09-26: 100 mg via INTRAVENOUS

## 2018-09-26 MED ORDER — DEXAMETHASONE SODIUM PHOSPHATE 10 MG/ML IJ SOLN
INTRAMUSCULAR | Status: AC
  Filled 2018-09-26: qty 1

## 2018-09-26 MED ORDER — ONDANSETRON HCL 4 MG/2ML IJ SOLN
INTRAMUSCULAR | Status: DC | PRN
  Administered 2018-09-26: 4 mg via INTRAVENOUS

## 2018-09-26 MED ORDER — FENTANYL CITRATE (PF) 100 MCG/2ML IJ SOLN: 100.0000 ug | Freq: Once | INTRAMUSCULAR | Status: DC

## 2018-09-26 MED ORDER — SODIUM CHLORIDE 0.9 % IR SOLN
Status: DC | PRN
  Administered 2018-09-26: 9000 mL

## 2018-09-26 MED ORDER — DEXAMETHASONE SODIUM PHOSPHATE 10 MG/ML IJ SOLN
INTRAMUSCULAR | Status: DC | PRN
  Administered 2018-09-26: 10 mg via INTRAVENOUS

## 2018-09-26 MED ORDER — MIDAZOLAM HCL 2 MG/2ML IJ SOLN: 2.0000 mg | Freq: Once | INTRAMUSCULAR | Status: DC

## 2018-09-26 MED ORDER — FENTANYL CITRATE (PF) 100 MCG/2ML IJ SOLN
INTRAMUSCULAR | Status: AC
  Administered 2018-09-26: 100 ug
  Filled 2018-09-26: qty 2

## 2018-09-26 MED ORDER — PROPOFOL 10 MG/ML IV BOLUS
INTRAVENOUS | Status: DC | PRN
  Administered 2018-09-26: 50 mg via INTRAVENOUS
  Administered 2018-09-26: 150 mg via INTRAVENOUS

## 2018-09-26 MED ORDER — CLINDAMYCIN PHOSPHATE 900 MG/50ML IV SOLN
900.0000 mg | INTRAVENOUS | Status: AC
  Administered 2018-09-26: 900 mg via INTRAVENOUS
  Filled 2018-09-26: qty 50

## 2018-09-26 MED ORDER — MIDAZOLAM HCL 2 MG/2ML IJ SOLN
INTRAMUSCULAR | Status: AC
  Filled 2018-09-26: qty 2

## 2018-09-26 MED ORDER — ONDANSETRON HCL 4 MG/2ML IJ SOLN
4.0000 mg | Freq: Once | INTRAMUSCULAR | Status: AC | PRN
  Administered 2018-09-26: 4 mg via INTRAVENOUS

## 2018-09-26 MED ORDER — 0.9 % SODIUM CHLORIDE (POUR BTL) OPTIME
TOPICAL | Status: DC | PRN
  Administered 2018-09-26: 1000 mL

## 2018-09-26 MED ORDER — HYDROMORPHONE HCL 1 MG/ML IJ SOLN: 0.2500 mg | INTRAMUSCULAR | Status: DC | PRN

## 2018-09-26 MED ORDER — FENTANYL CITRATE (PF) 250 MCG/5ML IJ SOLN
INTRAMUSCULAR | Status: AC
  Filled 2018-09-26: qty 5

## 2018-09-26 MED ORDER — MIDAZOLAM HCL 2 MG/2ML IJ SOLN
INTRAMUSCULAR | Status: DC | PRN
  Administered 2018-09-26: 2 mg via INTRAVENOUS

## 2018-09-26 MED ORDER — LIDOCAINE-EPINEPHRINE (PF) 1.5 %-1:200000 IJ SOLN
INTRAMUSCULAR | Status: DC | PRN
  Administered 2018-09-26: 30 mL via PERINEURAL

## 2018-09-26 MED ORDER — PROPOFOL 10 MG/ML IV BOLUS
INTRAVENOUS | Status: AC
  Filled 2018-09-26: qty 20

## 2018-09-26 MED ORDER — SCOPOLAMINE 1 MG/3DAYS TD PT72
MEDICATED_PATCH | TRANSDERMAL | Status: AC
  Filled 2018-09-26: qty 1

## 2018-09-26 MED ORDER — OXYCODONE HCL 5 MG PO TABS: 5.0000 mg | ORAL_TABLET | ORAL | 0 refills | Status: AC | PRN

## 2018-09-26 MED ORDER — SCOPOLAMINE 1 MG/3DAYS TD PT72
MEDICATED_PATCH | TRANSDERMAL | Status: DC | PRN
  Administered 2018-09-26: 1 via TRANSDERMAL

## 2018-09-26 MED ORDER — ONDANSETRON HCL 4 MG/2ML IJ SOLN: 4.0000 mg | Freq: Once | INTRAMUSCULAR | Status: DC

## 2018-09-26 MED ORDER — CHLORHEXIDINE GLUCONATE 4 % EX LIQD: 60.0000 mL | Freq: Once | CUTANEOUS | Status: DC

## 2018-09-26 MED ORDER — LIDOCAINE 2% (20 MG/ML) 5 ML SYRINGE
INTRAMUSCULAR | Status: AC
  Filled 2018-09-26: qty 5

## 2018-09-26 MED ORDER — LACTATED RINGERS IV SOLN
INTRAVENOUS | Status: DC
  Administered 2018-09-26 (×2): via INTRAVENOUS

## 2018-09-26 SURGICAL SUPPLY — 64 items
BANDAGE ACE 4X5 VEL STRL LF (GAUZE/BANDAGES/DRESSINGS) ×2 IMPLANT
BANDAGE ACE 6X5 VEL STRL LF (GAUZE/BANDAGES/DRESSINGS) ×2 IMPLANT
BANDAGE ESMARK 6X9 LF (GAUZE/BANDAGES/DRESSINGS) ×1 IMPLANT
BENZOIN TINCTURE PRP APPL 2/3 (GAUZE/BANDAGES/DRESSINGS) IMPLANT
BLADE CUDA GRT WHITE 3.5 (BLADE) IMPLANT
BLADE CUTTER GATOR 3.5 (BLADE) ×1 IMPLANT
BLADE SURG 15 STRL LF DISP TIS (BLADE) ×1 IMPLANT
BLADE SURG 15 STRL SS (BLADE) ×1
BNDG CMPR 9X4 STRL LF SNTH (GAUZE/BANDAGES/DRESSINGS) ×1
BNDG CMPR 9X6 STRL LF SNTH (GAUZE/BANDAGES/DRESSINGS) ×1
BNDG COHESIVE 4X5 TAN STRL (GAUZE/BANDAGES/DRESSINGS) IMPLANT
BNDG ELASTIC 6X10 VLCR STRL LF (GAUZE/BANDAGES/DRESSINGS) ×1 IMPLANT
BNDG ESMARK 4X9 LF (GAUZE/BANDAGES/DRESSINGS) ×1 IMPLANT
BNDG ESMARK 6X9 LF (GAUZE/BANDAGES/DRESSINGS) ×2
BUR CUDA 2.9 (BURR) IMPLANT
BUR GATOR 2.9 (BURR) IMPLANT
CHLORAPREP W/TINT 26ML (MISCELLANEOUS) ×2 IMPLANT
COVER BACK TABLE 60X90IN (DRAPES) ×2 IMPLANT
COVER WAND RF STERILE (DRAPES) IMPLANT
CUFF TOURNIQUET SINGLE 34IN LL (TOURNIQUET CUFF) ×2 IMPLANT
DECANTER SPIKE VIAL GLASS SM (MISCELLANEOUS) IMPLANT
DRAPE ARTHROSCOPY W/POUCH 114 (DRAPES) ×2 IMPLANT
DRAPE IMP U-DRAPE 54X76 (DRAPES) ×2 IMPLANT
DRAPE OEC MINIVIEW 54X84 (DRAPES) ×2 IMPLANT
DRAPE U-SHAPE 47X51 STRL (DRAPES) ×2 IMPLANT
DRSG XEROFORM 1X8 (GAUZE/BANDAGES/DRESSINGS) ×1 IMPLANT
ELECT REM PT RETURN 9FT ADLT (ELECTROSURGICAL)
ELECTRODE REM PT RTRN 9FT ADLT (ELECTROSURGICAL) IMPLANT
GAUZE SPONGE 4X4 12PLY STRL (GAUZE/BANDAGES/DRESSINGS) ×2 IMPLANT
GAUZE XEROFORM 1X8 LF (GAUZE/BANDAGES/DRESSINGS) ×2 IMPLANT
GLOVE BIOGEL M STRL SZ7.5 (GLOVE) ×2 IMPLANT
GLOVE BIOGEL PI IND STRL 8 (GLOVE) ×1 IMPLANT
GLOVE BIOGEL PI INDICATOR 8 (GLOVE) ×1
GOWN STRL REUS W/ TWL LRG LVL3 (GOWN DISPOSABLE) ×1 IMPLANT
GOWN STRL REUS W/ TWL XL LVL3 (GOWN DISPOSABLE) ×1 IMPLANT
GOWN STRL REUS W/TWL LRG LVL3 (GOWN DISPOSABLE) ×1
GOWN STRL REUS W/TWL XL LVL3 (GOWN DISPOSABLE) ×1
KIT BASIN OR (CUSTOM PROCEDURE TRAY) ×2 IMPLANT
NDL SPNL 18GX3.5 QUINCKE PK (NEEDLE) IMPLANT
NEEDLE SPNL 18GX3.5 QUINCKE PK (NEEDLE) ×2 IMPLANT
NS IRRIG 1000ML POUR BTL (IV SOLUTION) ×2 IMPLANT
PAD CAST 4YDX4 CTTN HI CHSV (CAST SUPPLIES) ×1 IMPLANT
PADDING CAST COTTON 4X4 STRL (CAST SUPPLIES) ×1
PADDING CAST SYNTHETIC 4 (CAST SUPPLIES) ×1
PADDING CAST SYNTHETIC 4X4 STR (CAST SUPPLIES) ×1 IMPLANT
PENCIL BUTTON HOLSTER BLD 10FT (ELECTRODE) IMPLANT
SLEEVE SCD COMPRESS KNEE MED (MISCELLANEOUS) ×2 IMPLANT
SPLINT FIBERGLASS 4X30 (CAST SUPPLIES) ×2 IMPLANT
SPONGE LAP 18X18 RF (DISPOSABLE) IMPLANT
STOCKINETTE 6  STRL (DRAPES) ×1
STOCKINETTE 6 STRL (DRAPES) ×1 IMPLANT
STRAP ANKLE FOOT DISTRACTOR (ORTHOPEDIC SUPPLIES) ×2 IMPLANT
STRIP CLOSURE SKIN 1/2X4 (GAUZE/BANDAGES/DRESSINGS) IMPLANT
SUCTION FRAZIER HANDLE 10FR (MISCELLANEOUS) ×1
SUCTION TUBE FRAZIER 10FR DISP (MISCELLANEOUS) ×1 IMPLANT
SUT ETHILON 3 0 PS 1 (SUTURE) ×2 IMPLANT
SUT MNCRL AB 3-0 PS2 18 (SUTURE) ×2 IMPLANT
SUT PDS AB 2-0 CT2 27 (SUTURE) ×2 IMPLANT
SUT VIC AB 3-0 FS2 27 (SUTURE) IMPLANT
SYR BULB 3OZ (MISCELLANEOUS) IMPLANT
TOWEL GREEN STERILE FF (TOWEL DISPOSABLE) ×4 IMPLANT
TUBE CONNECTING 20X1/4 (TUBING) ×2 IMPLANT
TUBING ARTHRO INFLOW-ONLY STRL (TUBING) ×2 IMPLANT
UNDERPAD 30X30 (UNDERPADS AND DIAPERS) ×2 IMPLANT

## 2018-09-26 NOTE — Transfer of Care (Signed)
Immediate Anesthesia Transfer of Care Note  Patient: Gilbert [name]  Procedure(s) Performed: ANKLE DEBRIDEMENT WITH POSSIBLE LATERAL LIGAMENT  RECONSTRUCTION (Right Knee)  Patient Location: PACU  Anesthesia Type:General and Regional  Level of Consciousness: awake and alert   Airway & Oxygen Therapy: Patient Spontanous Breathing  Post-op Assessment: Report given to RN, Post -op Vital signs reviewed and stable and Patient moving all extremities X 4  Post vital signs: Reviewed and stable  Last Vitals:  Vitals Value Taken Time  BP 155/94 09/26/2018  2:59 PM  Temp    Pulse 93 09/26/2018  2:59 PM  Resp 9 09/26/2018  2:59 PM  SpO2 97 % 09/26/2018  2:59 PM  Vitals shown include unvalidated device data.  Last Pain:  Vitals:   09/26/18 0931  PainSc: 3       Patients Stated Pain Goal: 7 (09/26/70 5366)  Complications: No apparent anesthesia complications

## 2018-09-26 NOTE — Progress Notes (Signed)
Orthopedic Tech Progress Note Patient Details:  Gilbert [name] February 25, 1971 [id number]  Ortho Devices Type of Ortho Device: Prafo boot/shoe, Crutches Ortho Device/Splint Interventions: Application   Post Interventions Patient Tolerated: Well Instructions Provided: Care of device   [place] Pink 09/26/2018, 3:45 PM

## 2018-09-26 NOTE — Discharge Instructions (Signed)
DR. Alyssamae Klinck FOOT & ANKLE SURGERY POST-OP INSTRUCTIONS ° ° °Pain Management °1. The numbing medicine and your leg will last around 8 hours, take a dose of your pain medicine as soon as you feel it wearing off to avoid rebound pain. °2. Keep your foot elevated above heart level.  Make sure that your heel hangs free ('floats'). °3. Take all prescribed medication as directed. °4. If taking narcotic pain medication you may want to use an over-the-counter stool softener to avoid constipation. °5. You may take over-the-counter NSAIDs (ibuprofen, naproxen, etc.) as well as over-the-counter acetaminophen as directed on the packaging as a supplement for your pain and may also use it to wean away from the prescription medication. ° °Activity °? Non-weightbearing °? Postoperatively, you will be placed into a splint which stays on for 2 weeks and then will be changed at your first postop visit. ° °First Postoperative Visit °1. Your first postop visit will be at least 2 weeks after surgery.  This should be scheduled when you schedule surgery. °2. If you do not have a postoperative visit scheduled please call [phone] to schedule an appointment. °3. At the appointment your incision will be evaluated for suture removal, x-rays will be obtained if necessary. ° °General Instructions °1. Swelling is very common after foot and ankle surgery.  It often takes 3 months for the foot and ankle to begin to feel comfortable.  Some amount of swelling will persist for 6-12 months. °2. DO NOT change the dressing.  If there is a problem with the dressing (too tight, loose, gets wet, etc.) please contact Dr. Neomia Herbel's office. °3. DO NOT get the dressing wet.  For showers you can use an over-the-counter cast cover or wrap a washcloth around the top of your dressing and then cover it with a plastic bag and tape it to your leg. °4. DO NOT soak the incision (no tubs, pools, bath, etc.) until you have approval from Dr. Carlyann Placide. ° °Contact Dr. Adairs  office or go to Emergency Room if: °1. Temperature above 101° F. °2. Increasing pain that is unresponsive to pain medication or elevation °3. Excessive redness or swelling in your foot °4. Dressing problems - excessive bloody drainage, looseness or tightness, or if dressing gets wet °5. Develop pain, swelling, warmth, or discoloration of your calf ° °

## 2018-09-26 NOTE — Op Note (Signed)
Gilbert [name] male 48 y.o. 09/26/2018  PreOperative Diagnosis: Ankle impingement Ankle synovitis Ankle arthritis  PostOperative Diagnosis: Ankle impingement Ankle synovitis Ankle arthritis Medial talar dome osteochondral lesion   Procedure(s) and Anesthesia Type: Arthroscopic extensive debridement of the right ankle Arthroscopic treatment of osteochondral lesion of the talus     Surgeon: Erle Crocker   Assistants: Larkin Ina, RNFA  Anesthesia: General LMA anesthesia with peripheral nerve block  Findings: Significant amount of ankle synovitis and loose bodies.  Loose bony fragments Anterior distal tibial osteophyte Medial talar dome osteochondral lesion Severe chondrosis  Implants: None  Indications:47 y.o. male has injured his ankle in the past.  Over the past several months he is developed pain and swelling in his right ankle.  He is failed conservative treatment form of boot immobilization, discal therapy, anti-inflammatories, ice, activity modification and was indicated for arthroscopic debridement of his ankle.  MRI was done which showed some large bony loose bodies as well as large anterior tibial osteophyte and evidence of likely medial talar dome osteochondral lesion.  The risk, benefits alternatives of the surgery were discussed with the patient and he wished to proceed.  The risk discussed included but were not limited to any other complications, infection, nonunion, malunion, need for second surgery, demonstrating structures, continued pain, less than optimal outcome, we also discussed the perioperative and anesthetic risks which included death. Again after weighing these risks he wished to proceed.  Procedure Detail: The patient was identified in the preoperative holding area.  The right lower extremity was marked by myself and the consent was signed by myself and the patient.  He was taken to the operative suite and placed supine on the operative table.  General  LMA anesthesia was induced without difficulty.  A thigh tourniquet was placed and a bump under the right hip was placed.  All bony prominences well-padded.  Preoperative antibiotics were given.  A surgical pause was performed.  The right lower extremity was prepped and draped in usual sterile fashion.  The leg was elevated and the thigh tourniquet was elevated to 250 mmHg.  The ankle distractor was placed.  I began by making an anteromedial portal medial to the tibialis anterior at the level of the ankle joint.  This was taken sharply through skin and dermis.  Then blunt dissection was used to get down to the joint capsule which was then entered with a hemostat.  I then placed the trocar and the camera within the joint and turn on the water.  Upon entering the joint it was noted there was significant amount of synovitis anterior about the ankle as well as some large osteophytes on the anterolateral aspect of the ankle joint as well as several osteochondral loose bodies.  Was unable to make an anterolateral portal and placed the probe.  There is found to be a large loose fragment of the anterior tibial osteophyte as well as several bony fragments.  I then inserted the shaver and begin extensive debridement of the ankle joint anteriorly.  After the better visualization was obtained I was able to visualize the tibial plafond and there was significant out of chondrosis and cartilaginous damage.  The the talus portion of the ankle joint was intact however on the medial aspect of the talar dome there was a large osteochondral lesion with a large free-floating flap of cartilage.  There was some synovitis within this and exposed subchondral bone.  Using a curette I was able to debride back the loose cartilaginous  pieces to a stable cartilage space and then using the shaver and a curette was able to clear out the devitalized bone back to healthy bleeding base.  Then further debridement of the ankle joint was performed I  was able to remove all the loose pieces and using an osteotome remove the impinging portion of the distal anterior tibial osteophyte.  The cartilage was debrided back to stable cartilaginous base throughout the anterior portion of the distal tibia.  The ankle was taken through range of motion and did not appear to have any evidence of impingement.  Prior to debridement it was noted that there was several troughs within the cartilage where the impingement and the loose bodies had been damaging the talar cartilage laterally.  After debridement of the osteochondral lesion it was noted that there was bleeding within the base of this lesion.  The arthroscope and shaver were then removed.  The ankle was stressed in the operative room and found to be stable to anterior drawer and talar tilt testing.  The anterior portals were closed with a 3-0 nylon stitch.  Xeroform, 4 x 4's and sterile she cotton was used.  A short leg splint was placed.  He was awakened from anesthesia and taken to the recovery room in stable condition.  There were no complications.  Post Op Instructions: Nonweightbearing right lower extremity Keep splint dry Return to clinic in 2 weeks for wound check He will likely need immobilization for a total of 6 weeks No need for formal DVT prophylaxis  Estimated Blood Loss:  Minimal         Drains: none  Blood Given: none         Specimens: none       Complications:  * No complications entered in OR log *         Disposition: PACU - hemodynamically stable.         Condition: stable

## 2018-09-26 NOTE — H&P (Signed)
Gilbert [name] is an 48 y.o. male.   Chief Complaint: Right ankle pain HPI: Patient is been dealing with right ankle pain for several months.  He had a previous injury.  MRI was done and showed some osteochondral defect of the talus as well as some anterior loose bodies.  He continued pain and swelling despite immobilization in the form of a Cam walking boot and ASO brace.  He was indicated for open debridement given the amount of pain and disability.  Past Medical History:  Diagnosis Date  . Chronic back pain   . GERD (gastroesophageal reflux disease)   . Hepatitis    unknown type many yrs ago - no residual problems  . Hypertension   . Insomnia   . Morbid obesity ([place])   . Ocular rosacea     Past Surgical History:  Procedure Laterality Date  . HIATAL HERNIA REPAIR  04/23/2012   Procedure: HERNIA REPAIR HIATAL;  Surgeon: Pedro Earls, MD;  Location: [facility];  Service: General;  Laterality: N/A;  . KNEE SURGERY Right    fracture knee cap  . LAPAROSCOPIC APPENDECTOMY N/A 07/31/2018   Procedure: APPENDECTOMY LAPAROSCOPIC;  Surgeon: Jovita Kussmaul, MD;  Location: [facility];  Service: General;  Laterality: N/A;  . LAPAROSCOPIC GASTRIC BANDING  04/23/2012   Procedure: LAPAROSCOPIC GASTRIC BANDING;  Surgeon: Pedro Earls, MD;  Location: [facility];  Service: General;  Laterality: N/A;  . LASIK      Family History  Problem Relation Age of Onset  . Hypertension Father   . Diabetes Father   . Heart disease Father        pacemaker   Social History:  reports that he has never smoked. He has never used smokeless tobacco. He reports current alcohol use of about 5.0 standard drinks of alcohol per week. He reports that he does not use drugs.  Allergies:  Allergies  Allergen Reactions  . Penicillins Rash    Has patient had a PCN reaction causing immediate rash, facial/tongue/throat swelling, SOB or lightheadedness with hypotension: Unknown Has patient had a PCN reaction causing severe rash  involving mucus membranes or skin necrosis: Unknown Has patient had a PCN reaction that required hospitalization: Unknown Has patient had a PCN reaction occurring within the last 10 years: No If all of the above answers are "NO", then may proceed with Cephalosporin use.     Medications Prior to Admission  Medication Sig Dispense Refill  . enalapril (VASOTEC) 20 MG tablet Take 2 tablets (40 mg total) by mouth daily. 180 tablet 0  . ibuprofen (ADVIL,MOTRIN) 200 MG tablet Take 600 mg by mouth 3 (three) times daily.    . traZODone (DESYREL) 50 MG tablet Take 0.5-1 tablets (25-50 mg total) by mouth at bedtime as needed for sleep. 30 tablet 1  . Vitamin D, Ergocalciferol, (DRISDOL) 1.25 MG (50000 UT) CAPS capsule Take 1 capsule (50,000 Units total) by mouth every 7 (seven) days. 30 capsule 0    No results found for this or any previous visit (from the past 48 hour(s)). No results found.  Review of Systems  Constitutional: Negative.   HENT: Negative.   Eyes: Negative.   Respiratory: Negative.   Cardiovascular: Negative.   Gastrointestinal: Negative.   Musculoskeletal:       Right ankle pain and swelling  Skin: Negative.   Neurological: Negative.   Psychiatric/Behavioral: Negative.     Blood pressure (!) 154/90, pulse 88, temperature 98.2 F (36.8 C), resp. rate 20, SpO2  97 %. Physical Exam  Constitutional: He appears well-developed.  HENT:  Head: Normocephalic.  Eyes: Conjunctivae are normal.  Neck: Neck supple.  Cardiovascular: Normal rate.  Respiratory: Effort normal.  GI: Soft.  Musculoskeletal:     Comments: Right ankle shows swelling.  Tender to palpation anterior ankle.  No instability noted with anterior drawer.  Some crepitance with motion.  Motor intact.  Sensation intact.  Palpable dorsalis pedis pulse.  Neurological: He is alert.  Skin: Skin is warm.     Assessment/Plan We will plan for arthroscopic debridement of the right ankle.  Possible arthroscopic treatment  of osteochondral lesion if 1 is found that needs debriding.  Will stress the ankle in the operating room and decide if we need to do lateral ligament reconstruction as well.  The risk, benefits and alternatives were discussed with him.  Erle Crocker, MD 09/26/2018, 12:54 PM

## 2018-09-26 NOTE — Anesthesia Preprocedure Evaluation (Signed)
Anesthesia Evaluation  Patient identified by MRN, date of birth, ID band Patient awake    Reviewed: Allergy & Precautions, NPO status , Patient's Chart, lab work & pertinent test results  Airway Mallampati: I  TM Distance: >3 FB Neck ROM: Full    Dental   Pulmonary    Pulmonary exam normal        Cardiovascular hypertension, Pt. on medications Normal cardiovascular exam     Neuro/Psych    GI/Hepatic GERD  Medicated and Controlled,(+) Hepatitis -  Endo/Other    Renal/GU      Musculoskeletal   Abdominal   Peds  Hematology   Anesthesia Other Findings   Reproductive/Obstetrics                             Anesthesia Physical Anesthesia Plan  ASA: III  Anesthesia Plan: General   Post-op Pain Management:    Induction: Intravenous  PONV Risk Score and Plan: 2 and Ondansetron and Treatment may vary due to age or medical condition  Airway Management Planned: LMA  Additional Equipment:   Intra-op Plan:   Post-operative Plan: Extubation in OR  Informed Consent: I have reviewed the patients History and Physical, chart, labs and discussed the procedure including the risks, benefits and alternatives for the proposed anesthesia with the patient or authorized representative who has indicated his/her understanding and acceptance.     Plan Discussed with: CRNA and Surgeon  Anesthesia Plan Comments:         Anesthesia Quick Evaluation

## 2018-09-26 NOTE — Brief Op Note (Signed)
09/26/2018  2:39 PM  PATIENT:  Gilbert [name]  48 y.o. male  PRE-OPERATIVE DIAGNOSIS:  RIGHT ANKLE ARTHRITIS AND LOOSE BODY  POST-OPERATIVE DIAGNOSIS:   RIGHT ANKLE ARTHRITIS AND LOOSE BODY Medial talar dome OCL  PROCEDURE:   Right ankle arthroscopic debridement Arthroscopic treatment of talar OCL  SURGEON:  Surgeon(s) and Role:    Erle Crocker, MD - Primary  PHYSICIAN ASSISTANT:   ASSISTANTS: none   ANESTHESIA:   general  EBL:  Minimal  BLOOD ADMINISTERED:none  DRAINS: none   LOCAL MEDICATIONS USED:  NONE  SPECIMEN:  No Specimen  DISPOSITION OF SPECIMEN:  N/A  COUNTS:  YES  TOURNIQUET:  * Missing tourniquet times found for documented tourniquets in log: [id number] *  DICTATION: .Viviann Spare Dictation  PLAN OF CARE: Discharge to home after PACU  PATIENT DISPOSITION:  PACU - hemodynamically stable.   Delay start of Pharmacological VTE agent (>24hrs) due to surgical blood loss or risk of bleeding: not applicable

## 2018-09-26 NOTE — Anesthesia Procedure Notes (Signed)
Anesthesia Regional Block: Popliteal block   Pre-Anesthetic Checklist: ,, timeout performed, Correct Patient, Correct Site, Correct Laterality, Correct Procedure, Correct Position, site marked, Risks and benefits discussed,  Surgical consent,  Pre-op evaluation,  At surgeon's request and post-op pain management  Laterality: Right  Prep: chloraprep       Needles:  Injection technique: Single-shot  Needle Type: Echogenic Stimulator Needle     Needle Length: 10cm  Needle Gauge: 21     Additional Needles:   Procedures:, nerve stimulator,,,,,,,   Nerve Stimulator or Paresthesia:  Response: 0.4 mA,   Additional Responses:   Narrative:  Start time: 09/26/2018 10:50 AM End time: 09/26/2018 11:05 AM Injection made incrementally with aspirations every 5 mL.  Performed by: Personally  Anesthesiologist: Lillia Abed, MD  Additional Notes: Monitors applied. Patient sedated. Sterile prep and drape,hand hygiene and sterile gloves were used. Relevant anatomy identified.Needle position confirmed.Local anesthetic injected incrementally after negative aspiration. Local anesthetic spread visualized around nerve(s). Vascular puncture avoided. No complications. Image printed for medical record.The patient tolerated the procedure well.  Additional Saphenous nerve block performed. 15cc Local Anesthetic mixture placed under ultrasonic guidance along the medio-inferior border of the Sartorious muscle 6 inches above the knee.  No Problems encountered.  Lillia Abed MD

## 2018-09-26 NOTE — Anesthesia Postprocedure Evaluation (Signed)
Anesthesia Post Note  Patient: Gilbert [name]  Procedure(s) Performed: ANKLE DEBRIDEMENT WITH POSSIBLE LATERAL LIGAMENT  RECONSTRUCTION (Right Knee)     Patient location during evaluation: PACU Anesthesia Type: General Level of consciousness: awake and alert Pain management: pain level controlled Vital Signs Assessment: post-procedure vital signs reviewed and stable Respiratory status: spontaneous breathing, nonlabored ventilation, respiratory function stable and patient connected to nasal cannula oxygen Cardiovascular status: blood pressure returned to baseline and stable Postop Assessment: no apparent nausea or vomiting Anesthetic complications: no    Last Vitals:  Vitals:   09/26/18 1642 09/26/18 1645  BP:  (!) 154/102  Pulse:    Resp: 16   Temp: 36.5 C   SpO2: 95%     Last Pain:  Vitals:   09/26/18 1500  PainSc: Coal                 Talaysia Pinheiro DAVID

## 2018-09-26 NOTE — Anesthesia Procedure Notes (Signed)
Procedure Name: LMA Insertion Date/Time: 09/26/2018 1:26 PM Performed by: Harden Mo, CRNA Pre-anesthesia Checklist: Patient identified, Emergency Drugs available, Suction available and Patient being monitored Patient Re-evaluated:Patient Re-evaluated prior to induction Oxygen Delivery Method: Circle System Utilized Preoxygenation: Pre-oxygenation with 100% oxygen Induction Type: IV induction LMA: LMA inserted LMA Size: 5.0 Number of attempts: 1 Airway Equipment and Method: Bite block Placement Confirmation: positive ETCO2 Tube secured with: Tape Dental Injury: Teeth and Oropharynx as per pre-operative assessment

## 2018-09-27 ENCOUNTER — Encounter (HOSPITAL_COMMUNITY): Payer: Self-pay | Admitting: Orthopaedic Surgery

## 2018-10-12 ENCOUNTER — Other Ambulatory Visit: Payer: Self-pay | Admitting: Family Medicine

## 2018-10-12 DIAGNOSIS — G4700 Insomnia, unspecified: Secondary | ICD-10-CM

## 2018-10-15 DIAGNOSIS — M19071 Primary osteoarthritis, right ankle and foot: Secondary | ICD-10-CM | POA: Diagnosis not present

## 2018-10-15 DIAGNOSIS — M93271 Osteochondritis dissecans, right ankle and joints of right foot: Secondary | ICD-10-CM | POA: Diagnosis not present

## 2018-10-15 DIAGNOSIS — M25871 Other specified joint disorders, right ankle and foot: Secondary | ICD-10-CM | POA: Diagnosis not present

## 2018-10-21 DIAGNOSIS — H04123 Dry eye syndrome of bilateral lacrimal glands: Secondary | ICD-10-CM | POA: Diagnosis not present

## 2018-11-04 DIAGNOSIS — M19071 Primary osteoarthritis, right ankle and foot: Secondary | ICD-10-CM | POA: Diagnosis not present

## 2018-11-05 DIAGNOSIS — M25571 Pain in right ankle and joints of right foot: Secondary | ICD-10-CM | POA: Diagnosis not present

## 2018-11-05 DIAGNOSIS — M25671 Stiffness of right ankle, not elsewhere classified: Secondary | ICD-10-CM | POA: Diagnosis not present

## 2018-11-08 DIAGNOSIS — M25571 Pain in right ankle and joints of right foot: Secondary | ICD-10-CM | POA: Diagnosis not present

## 2018-11-08 DIAGNOSIS — M25671 Stiffness of right ankle, not elsewhere classified: Secondary | ICD-10-CM | POA: Diagnosis not present

## 2018-11-11 DIAGNOSIS — M25571 Pain in right ankle and joints of right foot: Secondary | ICD-10-CM | POA: Diagnosis not present

## 2018-11-11 DIAGNOSIS — M25671 Stiffness of right ankle, not elsewhere classified: Secondary | ICD-10-CM | POA: Diagnosis not present

## 2018-11-13 DIAGNOSIS — M25671 Stiffness of right ankle, not elsewhere classified: Secondary | ICD-10-CM | POA: Diagnosis not present

## 2018-11-13 DIAGNOSIS — M25571 Pain in right ankle and joints of right foot: Secondary | ICD-10-CM | POA: Diagnosis not present

## 2018-11-15 DIAGNOSIS — M25871 Other specified joint disorders, right ankle and foot: Secondary | ICD-10-CM | POA: Diagnosis not present

## 2018-11-15 DIAGNOSIS — M19071 Primary osteoarthritis, right ankle and foot: Secondary | ICD-10-CM | POA: Diagnosis not present

## 2018-11-15 DIAGNOSIS — M93271 Osteochondritis dissecans, right ankle and joints of right foot: Secondary | ICD-10-CM | POA: Diagnosis not present

## 2018-11-18 DIAGNOSIS — M25671 Stiffness of right ankle, not elsewhere classified: Secondary | ICD-10-CM | POA: Diagnosis not present

## 2018-11-18 DIAGNOSIS — M25571 Pain in right ankle and joints of right foot: Secondary | ICD-10-CM | POA: Diagnosis not present

## 2018-11-20 DIAGNOSIS — M25571 Pain in right ankle and joints of right foot: Secondary | ICD-10-CM | POA: Diagnosis not present

## 2018-11-20 DIAGNOSIS — M25671 Stiffness of right ankle, not elsewhere classified: Secondary | ICD-10-CM | POA: Diagnosis not present

## 2018-11-21 ENCOUNTER — Encounter: Payer: Self-pay | Admitting: Family Medicine

## 2018-11-21 ENCOUNTER — Ambulatory Visit: Payer: BLUE CROSS/BLUE SHIELD | Admitting: Family Medicine

## 2018-11-21 VITALS — BP 120/74 | Ht 75.0 in | Wt 239.0 lb

## 2018-11-21 DIAGNOSIS — J0110 Acute frontal sinusitis, unspecified: Secondary | ICD-10-CM

## 2018-11-21 DIAGNOSIS — L290 Pruritus ani: Secondary | ICD-10-CM

## 2018-11-21 DIAGNOSIS — R61 Generalized hyperhidrosis: Secondary | ICD-10-CM

## 2018-11-21 DIAGNOSIS — Z7289 Other problems related to lifestyle: Secondary | ICD-10-CM | POA: Diagnosis not present

## 2018-11-21 DIAGNOSIS — J300 Vasomotor rhinitis: Secondary | ICD-10-CM

## 2018-11-21 DIAGNOSIS — F341 Dysthymic disorder: Secondary | ICD-10-CM

## 2018-11-21 DIAGNOSIS — Z789 Other specified health status: Secondary | ICD-10-CM

## 2018-11-21 DIAGNOSIS — I10 Essential (primary) hypertension: Secondary | ICD-10-CM | POA: Diagnosis not present

## 2018-11-21 HISTORY — DX: Dysthymic disorder: F34.1

## 2018-11-21 HISTORY — DX: Pruritus ani: L29.0

## 2018-11-21 HISTORY — DX: Vasomotor rhinitis: J30.0

## 2018-11-21 HISTORY — DX: Generalized hyperhidrosis: R61

## 2018-11-21 MED ORDER — BUPROPION HCL 75 MG PO TABS: ORAL_TABLET | ORAL | 0 refills | Status: DC

## 2018-11-21 MED ORDER — FLUTICASONE PROPIONATE 50 MCG/ACT NA SUSP: 2.0000 | Freq: Every day | NASAL | 6 refills | Status: DC

## 2018-11-21 MED ORDER — HYDROCORTISONE 2.5 % RE CREA: 1.0000 "application " | TOPICAL_CREAM | Freq: Two times a day (BID) | RECTAL | 0 refills | Status: DC

## 2018-11-21 MED ORDER — PREDNISONE 10 MG PO TABS: 10.0000 mg | ORAL_TABLET | Freq: Two times a day (BID) | ORAL | 0 refills | Status: AC

## 2018-11-21 MED ORDER — CLARITHROMYCIN ER 500 MG PO TB24: 1000.0000 mg | ORAL_TABLET | Freq: Every day | ORAL | 0 refills | Status: DC

## 2018-11-21 NOTE — Addendum Note (Signed)
Addended by: Jon Billings on: 11/21/2018 10:10 AM   Modules accepted: Orders

## 2018-11-21 NOTE — Progress Notes (Addendum)
Established Patient Office Visit  Subjective:  Patient ID: Gilbert [name], male    DOB: Aug 02, 1971  Age: 48 y.o. MRN: [id number]  CC:  Chief Complaint  Patient presents with  . Follow-up    HPI Gilbert [name] presents for follow-up of his blood pressure that is looking good on 40 mg of Vasotec.  Tells me that he has not had anything to drink since the first of the year.  For the last few weeks he is been experiencing upper respiratory tract symptoms with nasal congestion postnasal drip and frontal sinus pressure.  He has had an ongoing cough that is been dry without wheezing.  He has been experiencing some night sweats but is gaining weight.  Rhinorrhea has been clear.  Patient has been experiencing anal itching with occasional pain when stooling that is not consistent.  He has occasional blood when wiping.  There is been no abdominal pains or change in stooling patterns.  Past Medical History:  Diagnosis Date  . Chronic back pain   . GERD (gastroesophageal reflux disease)   . Hepatitis    unknown type many yrs ago - no residual problems  . Hypertension   . Insomnia   . Morbid obesity ([place])   . Ocular rosacea     Past Surgical History:  Procedure Laterality Date  . ANKLE ARTHROSCOPY WITH RECONSTRUCTION Right 09/26/2018   Procedure: ANKLE DEBRIDEMENT WITH POSSIBLE LATERAL LIGAMENT  RECONSTRUCTION;  Surgeon: Erle Crocker, MD;  Location: [facility];  Service: Orthopedics;  Laterality: Right;  . HIATAL HERNIA REPAIR  04/23/2012   Procedure: HERNIA REPAIR HIATAL;  Surgeon: Pedro Earls, MD;  Location: [facility];  Service: General;  Laterality: N/A;  . KNEE SURGERY Right    fracture knee cap  . LAPAROSCOPIC APPENDECTOMY N/A 07/31/2018   Procedure: APPENDECTOMY LAPAROSCOPIC;  Surgeon: Jovita Kussmaul, MD;  Location: [facility];  Service: General;  Laterality: N/A;  . LAPAROSCOPIC GASTRIC BANDING  04/23/2012   Procedure: LAPAROSCOPIC GASTRIC BANDING;  Surgeon: Pedro Earls, MD;   Location: [facility];  Service: General;  Laterality: N/A;  . LASIK      Family History  Problem Relation Age of Onset  . Hypertension Father   . Diabetes Father   . Heart disease Father        pacemaker    Social History   Socioeconomic History  . Marital status: Married    Spouse name: Not on file  . Number of children: Not on file  . Years of education: Not on file  . Highest education level: Not on file  Occupational History  . Not on file  Social Needs  . Financial resource strain: Not on file  . Food insecurity:    Worry: Not on file    Inability: Not on file  . Transportation needs:    Medical: Not on file    Non-medical: Not on file  Tobacco Use  . Smoking status: Never Smoker  . Smokeless tobacco: Never Used  Substance and Sexual Activity  . Alcohol use: Yes    Alcohol/week: 5.0 standard drinks    Types: 5 Shots of liquor per week    Comment: social  . Drug use: No  . Sexual activity: Not on file  Lifestyle  . Physical activity:    Days per week: Not on file    Minutes per session: Not on file  . Stress: Not on file  Relationships  . Social connections:    Talks  on phone: Not on file    Gets together: Not on file    Attends religious service: Not on file    Active member of club or organization: Not on file    Attends meetings of clubs or organizations: Not on file    Relationship status: Not on file  . Intimate partner violence:    Fear of current or ex partner: Not on file    Emotionally abused: Not on file    Physically abused: Not on file    Forced sexual activity: Not on file  Other Topics Concern  . Not on file  Social History Narrative  . Not on file    Outpatient Medications Prior to Visit  Medication Sig Dispense Refill  . enalapril (VASOTEC) 20 MG tablet Take 2 tablets (40 mg total) by mouth daily. 180 tablet 0  . ibuprofen (ADVIL,MOTRIN) 200 MG tablet Take 600 mg by mouth 3 (three) times daily.    . traZODone (DESYREL) 50 MG tablet  TAKE 1/2-1 TABLETS (25-50 MG TOTAL) BY MOUTH AT BEDTIME AS NEEDED FOR SLEEP. 90 tablet 1   No facility-administered medications prior to visit.     Allergies  Allergen Reactions  . Penicillins Rash    Has patient had a PCN reaction causing immediate rash, facial/tongue/throat swelling, SOB or lightheadedness with hypotension: Unknown Has patient had a PCN reaction causing severe rash involving mucus membranes or skin necrosis: Unknown Has patient had a PCN reaction that required hospitalization: Unknown Has patient had a PCN reaction occurring within the last 10 years: No If all of the above answers are "NO", then may proceed with Cephalosporin use.     ROS Review of Systems  Constitutional: Positive for fatigue. Negative for diaphoresis, fever and unexpected weight change.  HENT: Positive for congestion, postnasal drip, rhinorrhea, sinus pressure, sinus pain and sore throat. Negative for voice change.   Eyes: Negative for photophobia and visual disturbance.  Respiratory: Positive for cough. Negative for shortness of breath and wheezing.   Cardiovascular: Negative.   Gastrointestinal: Negative.   Endocrine: Negative for polyphagia and polyuria.  Genitourinary: Negative.   Musculoskeletal: Positive for arthralgias.  Skin: Negative for pallor and rash.  Allergic/Immunologic: Negative for immunocompromised state.  Neurological: Negative for numbness and headaches.  Hematological: Does not bruise/bleed easily.  Psychiatric/Behavioral: Negative.       Objective:    Physical Exam  Constitutional: He is oriented to person, place, and time. He appears well-developed and well-nourished. No distress.  HENT:  Head: Normocephalic and atraumatic.  Right Ear: External ear normal.  Left Ear: External ear normal.  Mouth/Throat: Oropharynx is clear and moist. No oropharyngeal exudate.  Eyes: Pupils are equal, round, and reactive to light. Conjunctivae are normal. Right eye exhibits no  discharge. Left eye exhibits no discharge. No scleral icterus.  Neck: Neck supple. No JVD present. No tracheal deviation present. No thyromegaly present.  Cardiovascular: Normal rate, regular rhythm and normal heart sounds.  Pulmonary/Chest: Effort normal and breath sounds normal.  Abdominal: Soft. Bowel sounds are normal. He exhibits no distension and no mass. There is no abdominal tenderness. There is no rebound and no guarding.  Genitourinary: Rectum:     No external hemorrhoid.   Lymphadenopathy:    He has no cervical adenopathy.  Neurological: He is alert and oriented to person, place, and time.  Skin: Skin is warm and dry. He is not diaphoretic.  Psychiatric: He has a normal mood and affect. His behavior is normal.   Depression  screen [facility] 2/9 11/21/2018 09/20/2018 09/10/2017  Decreased Interest 1 1 0  Down, Depressed, Hopeless 1 1 0  PHQ - 2 Score 2 2 0  Altered sleeping 1 3 0  Tired, decreased energy 2 2 0  Change in appetite 2 1 0  Feeling bad or failure about yourself  1 1 0  Trouble concentrating 0 0 0  Moving slowly or fidgety/restless 0 0 0  Suicidal thoughts 0 0 0  PHQ-9 Score 8 9 0  Difficult doing work/chores - - Not difficult at all    BP 120/74   Ht 6\' 3"  (1.905 m)   Wt 239 lb (108.4 kg)   BMI 29.87 kg/m  Wt Readings from Last 3 Encounters:  11/21/18 239 lb (108.4 kg)  09/20/18 235 lb 8 oz (106.8 kg)  07/31/18 244 lb 11.4 oz (111 kg)   BP Readings from Last 3 Encounters:  11/21/18 120/74  09/26/18 (!) 154/102  09/20/18 118/70   Guideline developer:  UpToDate (see UpToDate for funding source) Date Released: June 2014  There are no preventive care reminders to display for this patient.  There are no preventive care reminders to display for this patient.  Lab Results  Component Value Date   TSH 0.92 09/10/2017   Lab Results  Component Value Date   WBC 6.9 09/20/2018   HGB 13.8 09/20/2018   HCT 40.6 09/20/2018   MCV 98.3 09/20/2018   PLT 398.0  09/20/2018   Lab Results  Component Value Date   NA 140 09/20/2018   K 4.1 09/20/2018   CO2 27 09/20/2018   GLUCOSE 78 09/20/2018   BUN 8 09/20/2018   CREATININE 0.71 09/20/2018   BILITOT 0.4 09/20/2018   ALKPHOS 62 09/20/2018   AST 12 09/20/2018   ALT 8 09/20/2018   PROT 6.6 09/20/2018   ALBUMIN 4.2 09/20/2018   CALCIUM 9.0 09/20/2018   ANIONGAP 13 07/31/2018   GFR 125.74 09/20/2018   Lab Results  Component Value Date   CHOL 174 09/20/2018   Lab Results  Component Value Date   HDL 86.40 09/20/2018   Lab Results  Component Value Date   LDLCALC 79 09/20/2018   Lab Results  Component Value Date   TRIG 45.0 09/20/2018   Lab Results  Component Value Date   CHOLHDL 2 09/20/2018   Lab Results  Component Value Date   HGBA1C 5.1 09/20/2018      Assessment & Plan:   Problem List Items Addressed This Visit      Cardiovascular and Mediastinum   Essential hypertension     Respiratory   Acute frontal sinusitis   Relevant Medications   fluticasone (FLONASE) 50 MCG/ACT nasal spray   predniSONE (DELTASONE) 10 MG tablet   Vasomotor rhinitis   Relevant Medications   fluticasone (FLONASE) 50 MCG/ACT nasal spray   predniSONE (DELTASONE) 10 MG tablet     Musculoskeletal and Integument   Pruritus ani   Relevant Medications   hydrocortisone (ANUSOL-HC) 2.5 % rectal cream     Other   Alcohol use - Primary   Dysthymia   Relevant Medications   buPROPion (WELLBUTRIN) 75 MG tablet   Night sweat      Meds ordered this encounter  Medications  . DISCONTD: clarithromycin (BIAXIN XL) 500 MG 24 hr tablet    Sig: Take 2 tablets (1,000 mg total) by mouth daily for 10 days.    Dispense:  20 tablet    Refill:  0  . fluticasone (FLONASE) 50 MCG/ACT  nasal spray    Sig: Place 2 sprays into both nostrils daily.    Dispense:  16 g    Refill:  6  . predniSONE (DELTASONE) 10 MG tablet    Sig: Take 1 tablet (10 mg total) by mouth 2 (two) times daily with a meal for 7 days.      Dispense:  14 tablet    Refill:  0  . buPROPion (WELLBUTRIN) 75 MG tablet    Sig: Take one each morning for one week and then start a second pill mid afternoon.    Dispense:  60 tablet    Refill:  0  . hydrocortisone (ANUSOL-HC) 2.5 % rectal cream    Sig: Place 1 application rectally 2 (two) times daily.    Dispense:  30 g    Refill:  0    Follow-up: Return in about 1 month (around 12/20/2018).   Believe the patient saw the suffers from dysthymia and worry.  Will start Wellbutrin.  He will follow-up in a month.  I am hoping that the night sweats will resolve over the next several weeks.

## 2018-11-26 DIAGNOSIS — M25671 Stiffness of right ankle, not elsewhere classified: Secondary | ICD-10-CM | POA: Diagnosis not present

## 2018-11-26 DIAGNOSIS — M25571 Pain in right ankle and joints of right foot: Secondary | ICD-10-CM | POA: Diagnosis not present

## 2018-11-28 DIAGNOSIS — M25571 Pain in right ankle and joints of right foot: Secondary | ICD-10-CM | POA: Diagnosis not present

## 2018-11-28 DIAGNOSIS — M25671 Stiffness of right ankle, not elsewhere classified: Secondary | ICD-10-CM | POA: Diagnosis not present

## 2018-12-02 DIAGNOSIS — M25571 Pain in right ankle and joints of right foot: Secondary | ICD-10-CM | POA: Diagnosis not present

## 2018-12-02 DIAGNOSIS — M25671 Stiffness of right ankle, not elsewhere classified: Secondary | ICD-10-CM | POA: Diagnosis not present

## 2018-12-04 DIAGNOSIS — M25671 Stiffness of right ankle, not elsewhere classified: Secondary | ICD-10-CM | POA: Diagnosis not present

## 2018-12-04 DIAGNOSIS — M25571 Pain in right ankle and joints of right foot: Secondary | ICD-10-CM | POA: Diagnosis not present

## 2018-12-10 DIAGNOSIS — M25571 Pain in right ankle and joints of right foot: Secondary | ICD-10-CM | POA: Diagnosis not present

## 2018-12-10 DIAGNOSIS — M25671 Stiffness of right ankle, not elsewhere classified: Secondary | ICD-10-CM | POA: Diagnosis not present

## 2018-12-13 ENCOUNTER — Other Ambulatory Visit: Payer: Self-pay | Admitting: Family Medicine

## 2018-12-13 DIAGNOSIS — F341 Dysthymic disorder: Secondary | ICD-10-CM

## 2018-12-14 DIAGNOSIS — M93271 Osteochondritis dissecans, right ankle and joints of right foot: Secondary | ICD-10-CM | POA: Diagnosis not present

## 2018-12-14 DIAGNOSIS — M25871 Other specified joint disorders, right ankle and foot: Secondary | ICD-10-CM | POA: Diagnosis not present

## 2018-12-14 DIAGNOSIS — M19071 Primary osteoarthritis, right ankle and foot: Secondary | ICD-10-CM | POA: Diagnosis not present

## 2018-12-19 ENCOUNTER — Ambulatory Visit: Payer: BLUE CROSS/BLUE SHIELD | Admitting: Family Medicine

## 2019-01-14 DIAGNOSIS — M19071 Primary osteoarthritis, right ankle and foot: Secondary | ICD-10-CM | POA: Diagnosis not present

## 2019-01-14 DIAGNOSIS — M93271 Osteochondritis dissecans, right ankle and joints of right foot: Secondary | ICD-10-CM | POA: Diagnosis not present

## 2019-01-14 DIAGNOSIS — M25871 Other specified joint disorders, right ankle and foot: Secondary | ICD-10-CM | POA: Diagnosis not present

## 2019-02-13 DIAGNOSIS — M19071 Primary osteoarthritis, right ankle and foot: Secondary | ICD-10-CM | POA: Diagnosis not present

## 2019-02-13 DIAGNOSIS — M93271 Osteochondritis dissecans, right ankle and joints of right foot: Secondary | ICD-10-CM | POA: Diagnosis not present

## 2019-02-13 DIAGNOSIS — M25871 Other specified joint disorders, right ankle and foot: Secondary | ICD-10-CM | POA: Diagnosis not present

## 2019-03-01 ENCOUNTER — Other Ambulatory Visit: Payer: Self-pay | Admitting: Family Medicine

## 2019-03-01 DIAGNOSIS — I10 Essential (primary) hypertension: Secondary | ICD-10-CM

## 2019-03-03 ENCOUNTER — Telehealth: Payer: Self-pay

## 2019-03-03 NOTE — Telephone Encounter (Signed)
Message sent to scheduling 

## 2019-03-03 NOTE — Telephone Encounter (Signed)
Can we get him scheduled for a follow up? In person or virtual is fine.

## 2019-03-04 NOTE — Telephone Encounter (Signed)
I called him and he said he still wants to hold off for now and he will want to call back at a later date to schedule, he didn't want a virtual or telephone either.

## 2019-03-05 DIAGNOSIS — M25571 Pain in right ankle and joints of right foot: Secondary | ICD-10-CM | POA: Diagnosis not present

## 2019-03-10 DIAGNOSIS — M25671 Stiffness of right ankle, not elsewhere classified: Secondary | ICD-10-CM | POA: Diagnosis not present

## 2019-03-10 DIAGNOSIS — M25571 Pain in right ankle and joints of right foot: Secondary | ICD-10-CM | POA: Diagnosis not present

## 2019-03-12 DIAGNOSIS — M25571 Pain in right ankle and joints of right foot: Secondary | ICD-10-CM | POA: Diagnosis not present

## 2019-03-12 DIAGNOSIS — M25671 Stiffness of right ankle, not elsewhere classified: Secondary | ICD-10-CM | POA: Diagnosis not present

## 2019-03-16 ENCOUNTER — Other Ambulatory Visit: Payer: Self-pay | Admitting: Family Medicine

## 2019-03-16 DIAGNOSIS — M25871 Other specified joint disorders, right ankle and foot: Secondary | ICD-10-CM | POA: Diagnosis not present

## 2019-03-16 DIAGNOSIS — F341 Dysthymic disorder: Secondary | ICD-10-CM

## 2019-03-16 DIAGNOSIS — M93271 Osteochondritis dissecans, right ankle and joints of right foot: Secondary | ICD-10-CM | POA: Diagnosis not present

## 2019-03-16 DIAGNOSIS — M19071 Primary osteoarthritis, right ankle and foot: Secondary | ICD-10-CM | POA: Diagnosis not present

## 2019-03-17 ENCOUNTER — Encounter: Payer: Self-pay | Admitting: Family Medicine

## 2019-03-25 ENCOUNTER — Other Ambulatory Visit: Payer: Self-pay | Admitting: Family Medicine

## 2019-03-25 DIAGNOSIS — I10 Essential (primary) hypertension: Secondary | ICD-10-CM

## 2019-03-27 DIAGNOSIS — M25571 Pain in right ankle and joints of right foot: Secondary | ICD-10-CM | POA: Diagnosis not present

## 2019-03-27 DIAGNOSIS — M25671 Stiffness of right ankle, not elsewhere classified: Secondary | ICD-10-CM | POA: Diagnosis not present

## 2019-03-30 ENCOUNTER — Other Ambulatory Visit: Payer: Self-pay | Admitting: Family Medicine

## 2019-03-30 DIAGNOSIS — E559 Vitamin D deficiency, unspecified: Secondary | ICD-10-CM

## 2019-04-01 DIAGNOSIS — M25671 Stiffness of right ankle, not elsewhere classified: Secondary | ICD-10-CM | POA: Diagnosis not present

## 2019-04-01 DIAGNOSIS — M25571 Pain in right ankle and joints of right foot: Secondary | ICD-10-CM | POA: Diagnosis not present

## 2019-04-03 DIAGNOSIS — M25571 Pain in right ankle and joints of right foot: Secondary | ICD-10-CM | POA: Diagnosis not present

## 2019-04-03 DIAGNOSIS — M25671 Stiffness of right ankle, not elsewhere classified: Secondary | ICD-10-CM | POA: Diagnosis not present

## 2019-04-14 DIAGNOSIS — M25571 Pain in right ankle and joints of right foot: Secondary | ICD-10-CM | POA: Diagnosis not present

## 2019-04-15 DIAGNOSIS — M93271 Osteochondritis dissecans, right ankle and joints of right foot: Secondary | ICD-10-CM | POA: Diagnosis not present

## 2019-04-15 DIAGNOSIS — M19071 Primary osteoarthritis, right ankle and foot: Secondary | ICD-10-CM | POA: Diagnosis not present

## 2019-04-15 DIAGNOSIS — M25871 Other specified joint disorders, right ankle and foot: Secondary | ICD-10-CM | POA: Diagnosis not present

## 2019-04-21 ENCOUNTER — Other Ambulatory Visit: Payer: Self-pay | Admitting: Family Medicine

## 2019-04-21 DIAGNOSIS — G4700 Insomnia, unspecified: Secondary | ICD-10-CM

## 2019-04-21 NOTE — Telephone Encounter (Signed)
Due for OV

## 2019-04-30 ENCOUNTER — Other Ambulatory Visit: Payer: Self-pay | Admitting: Family Medicine

## 2019-04-30 DIAGNOSIS — I10 Essential (primary) hypertension: Secondary | ICD-10-CM

## 2019-05-05 DIAGNOSIS — J029 Acute pharyngitis, unspecified: Secondary | ICD-10-CM | POA: Diagnosis not present

## 2019-05-05 DIAGNOSIS — R05 Cough: Secondary | ICD-10-CM | POA: Diagnosis not present

## 2019-05-05 DIAGNOSIS — R51 Headache: Secondary | ICD-10-CM | POA: Diagnosis not present

## 2019-05-05 DIAGNOSIS — Z20828 Contact with and (suspected) exposure to other viral communicable diseases: Secondary | ICD-10-CM | POA: Diagnosis not present

## 2019-05-08 DIAGNOSIS — L719 Rosacea, unspecified: Secondary | ICD-10-CM | POA: Diagnosis not present

## 2019-05-08 DIAGNOSIS — L720 Epidermal cyst: Secondary | ICD-10-CM | POA: Diagnosis not present

## 2019-05-08 DIAGNOSIS — L570 Actinic keratosis: Secondary | ICD-10-CM | POA: Diagnosis not present

## 2019-05-08 DIAGNOSIS — D485 Neoplasm of uncertain behavior of skin: Secondary | ICD-10-CM | POA: Diagnosis not present

## 2019-05-08 DIAGNOSIS — D2262 Melanocytic nevi of left upper limb, including shoulder: Secondary | ICD-10-CM | POA: Diagnosis not present

## 2019-05-08 DIAGNOSIS — C44712 Basal cell carcinoma of skin of right lower limb, including hip: Secondary | ICD-10-CM | POA: Diagnosis not present

## 2019-05-08 DIAGNOSIS — L814 Other melanin hyperpigmentation: Secondary | ICD-10-CM | POA: Diagnosis not present

## 2019-05-14 ENCOUNTER — Other Ambulatory Visit: Payer: Self-pay | Admitting: Family Medicine

## 2019-05-14 DIAGNOSIS — I10 Essential (primary) hypertension: Secondary | ICD-10-CM

## 2019-05-16 DIAGNOSIS — M93271 Osteochondritis dissecans, right ankle and joints of right foot: Secondary | ICD-10-CM | POA: Diagnosis not present

## 2019-05-16 DIAGNOSIS — M19071 Primary osteoarthritis, right ankle and foot: Secondary | ICD-10-CM | POA: Diagnosis not present

## 2019-05-16 DIAGNOSIS — M25871 Other specified joint disorders, right ankle and foot: Secondary | ICD-10-CM | POA: Diagnosis not present

## 2019-05-27 ENCOUNTER — Other Ambulatory Visit: Payer: Self-pay | Admitting: Family Medicine

## 2019-05-27 DIAGNOSIS — I10 Essential (primary) hypertension: Secondary | ICD-10-CM

## 2019-05-28 NOTE — Telephone Encounter (Signed)
Needs ov

## 2019-06-10 DIAGNOSIS — C44712 Basal cell carcinoma of skin of right lower limb, including hip: Secondary | ICD-10-CM | POA: Diagnosis not present

## 2019-06-13 ENCOUNTER — Other Ambulatory Visit: Payer: Self-pay | Admitting: Family Medicine

## 2019-06-13 DIAGNOSIS — F341 Dysthymic disorder: Secondary | ICD-10-CM

## 2019-06-16 DIAGNOSIS — M25871 Other specified joint disorders, right ankle and foot: Secondary | ICD-10-CM | POA: Diagnosis not present

## 2019-06-16 DIAGNOSIS — M93271 Osteochondritis dissecans, right ankle and joints of right foot: Secondary | ICD-10-CM | POA: Diagnosis not present

## 2019-06-16 DIAGNOSIS — M19071 Primary osteoarthritis, right ankle and foot: Secondary | ICD-10-CM | POA: Diagnosis not present

## 2019-06-21 ENCOUNTER — Other Ambulatory Visit: Payer: Self-pay | Admitting: Family Medicine

## 2019-06-21 DIAGNOSIS — I10 Essential (primary) hypertension: Secondary | ICD-10-CM

## 2019-07-16 ENCOUNTER — Other Ambulatory Visit: Payer: Self-pay | Admitting: Family Medicine

## 2019-07-16 DIAGNOSIS — I10 Essential (primary) hypertension: Secondary | ICD-10-CM

## 2019-07-16 DIAGNOSIS — M19071 Primary osteoarthritis, right ankle and foot: Secondary | ICD-10-CM | POA: Diagnosis not present

## 2019-07-16 DIAGNOSIS — M93271 Osteochondritis dissecans, right ankle and joints of right foot: Secondary | ICD-10-CM | POA: Diagnosis not present

## 2019-07-16 DIAGNOSIS — M25871 Other specified joint disorders, right ankle and foot: Secondary | ICD-10-CM | POA: Diagnosis not present

## 2019-07-25 DIAGNOSIS — Z20828 Contact with and (suspected) exposure to other viral communicable diseases: Secondary | ICD-10-CM | POA: Diagnosis not present

## 2019-08-04 ENCOUNTER — Other Ambulatory Visit: Payer: Self-pay

## 2019-08-04 DIAGNOSIS — Z20822 Contact with and (suspected) exposure to covid-19: Secondary | ICD-10-CM

## 2019-08-06 LAB — NOVEL CORONAVIRUS, NAA: SARS-CoV-2, NAA: NOT DETECTED

## 2019-08-16 DIAGNOSIS — M25871 Other specified joint disorders, right ankle and foot: Secondary | ICD-10-CM | POA: Diagnosis not present

## 2019-08-16 DIAGNOSIS — M93271 Osteochondritis dissecans, right ankle and joints of right foot: Secondary | ICD-10-CM | POA: Diagnosis not present

## 2019-08-16 DIAGNOSIS — M19071 Primary osteoarthritis, right ankle and foot: Secondary | ICD-10-CM | POA: Diagnosis not present

## 2019-08-17 ENCOUNTER — Other Ambulatory Visit: Payer: Self-pay | Admitting: Family Medicine

## 2019-08-17 DIAGNOSIS — I10 Essential (primary) hypertension: Secondary | ICD-10-CM

## 2019-09-01 ENCOUNTER — Other Ambulatory Visit: Payer: Self-pay | Admitting: Family Medicine

## 2019-09-01 DIAGNOSIS — I10 Essential (primary) hypertension: Secondary | ICD-10-CM

## 2019-09-01 NOTE — Telephone Encounter (Signed)
Attempted to reach patient. No answer. Vm left to call back  

## 2019-09-03 NOTE — Telephone Encounter (Signed)
Attempted to reach pt again, no answer. Left detailed vm (per DPR) requesting pt to call office to schedule visit.

## 2019-09-08 NOTE — Telephone Encounter (Signed)
Since unable to reach pt by phone. Sent [employer].

## 2019-09-10 NOTE — Telephone Encounter (Signed)
Pt seen mychart message about scheduling an appointment

## 2019-09-15 DIAGNOSIS — M93271 Osteochondritis dissecans, right ankle and joints of right foot: Secondary | ICD-10-CM | POA: Diagnosis not present

## 2019-09-15 DIAGNOSIS — M19071 Primary osteoarthritis, right ankle and foot: Secondary | ICD-10-CM | POA: Diagnosis not present

## 2019-09-15 DIAGNOSIS — M25871 Other specified joint disorders, right ankle and foot: Secondary | ICD-10-CM | POA: Diagnosis not present

## 2019-10-16 DIAGNOSIS — M25871 Other specified joint disorders, right ankle and foot: Secondary | ICD-10-CM | POA: Diagnosis not present

## 2019-10-16 DIAGNOSIS — M19071 Primary osteoarthritis, right ankle and foot: Secondary | ICD-10-CM | POA: Diagnosis not present

## 2019-10-16 DIAGNOSIS — M93271 Osteochondritis dissecans, right ankle and joints of right foot: Secondary | ICD-10-CM | POA: Diagnosis not present

## 2019-10-28 ENCOUNTER — Other Ambulatory Visit: Payer: Self-pay | Admitting: Family Medicine

## 2019-10-28 DIAGNOSIS — I10 Essential (primary) hypertension: Secondary | ICD-10-CM

## 2019-10-29 DIAGNOSIS — H04123 Dry eye syndrome of bilateral lacrimal glands: Secondary | ICD-10-CM | POA: Diagnosis not present

## 2019-10-31 NOTE — Telephone Encounter (Signed)
I tried to call and set up appt but voicemail was full

## 2019-11-05 NOTE — Telephone Encounter (Signed)
Called patient and sent a message via MyChart asking patient to give [place] a call to schedule a follow up appointment.

## 2019-11-16 DIAGNOSIS — M19071 Primary osteoarthritis, right ankle and foot: Secondary | ICD-10-CM | POA: Diagnosis not present

## 2019-11-16 DIAGNOSIS — M25871 Other specified joint disorders, right ankle and foot: Secondary | ICD-10-CM | POA: Diagnosis not present

## 2019-11-16 DIAGNOSIS — M93271 Osteochondritis dissecans, right ankle and joints of right foot: Secondary | ICD-10-CM | POA: Diagnosis not present

## 2019-11-20 ENCOUNTER — Other Ambulatory Visit: Payer: Self-pay | Admitting: Family Medicine

## 2019-11-20 DIAGNOSIS — I10 Essential (primary) hypertension: Secondary | ICD-10-CM

## 2019-11-20 NOTE — Telephone Encounter (Signed)
LM on VM to call back and schedule an appointment for follow up. Also sent message via Mychart informing pt to give [place] a call.

## 2019-12-14 DIAGNOSIS — M93271 Osteochondritis dissecans, right ankle and joints of right foot: Secondary | ICD-10-CM | POA: Diagnosis not present

## 2019-12-14 DIAGNOSIS — M25871 Other specified joint disorders, right ankle and foot: Secondary | ICD-10-CM | POA: Diagnosis not present

## 2019-12-14 DIAGNOSIS — M19071 Primary osteoarthritis, right ankle and foot: Secondary | ICD-10-CM | POA: Diagnosis not present

## 2019-12-29 ENCOUNTER — Other Ambulatory Visit: Payer: Self-pay | Admitting: Family Medicine

## 2019-12-29 DIAGNOSIS — E559 Vitamin D deficiency, unspecified: Secondary | ICD-10-CM

## 2020-01-29 ENCOUNTER — Other Ambulatory Visit: Payer: Self-pay | Admitting: Family Medicine

## 2020-01-29 DIAGNOSIS — I10 Essential (primary) hypertension: Secondary | ICD-10-CM

## 2020-01-29 NOTE — Telephone Encounter (Signed)
Please schedule this patient to see me.

## 2020-01-30 NOTE — Telephone Encounter (Signed)
Can ya'll plz sched this pt to see [place]? Thx dmf

## 2020-02-05 ENCOUNTER — Ambulatory Visit: Payer: Self-pay | Admitting: Family Medicine

## 2020-02-26 ENCOUNTER — Other Ambulatory Visit: Payer: Self-pay | Admitting: Family Medicine

## 2020-02-26 DIAGNOSIS — I10 Essential (primary) hypertension: Secondary | ICD-10-CM

## 2020-02-26 NOTE — Telephone Encounter (Signed)
Pt must have OV for any fills and was advised this with last fill/thx dmf

## 2020-03-22 ENCOUNTER — Other Ambulatory Visit: Payer: Self-pay | Admitting: Family Medicine

## 2020-03-22 DIAGNOSIS — I10 Essential (primary) hypertension: Secondary | ICD-10-CM

## 2020-04-09 ENCOUNTER — Telehealth: Payer: Self-pay | Admitting: Family Medicine

## 2020-04-09 NOTE — Telephone Encounter (Signed)
Called patient to reschedule cancelled appt from 02/05/20. Left voicemail for him to call the office to reschedule.

## 2020-04-18 ENCOUNTER — Other Ambulatory Visit: Payer: Self-pay | Admitting: Family Medicine

## 2020-04-18 DIAGNOSIS — I10 Essential (primary) hypertension: Secondary | ICD-10-CM

## 2020-04-30 DIAGNOSIS — Z20822 Contact with and (suspected) exposure to covid-19: Secondary | ICD-10-CM | POA: Diagnosis not present

## 2020-04-30 DIAGNOSIS — J029 Acute pharyngitis, unspecified: Secondary | ICD-10-CM | POA: Diagnosis not present

## 2020-04-30 DIAGNOSIS — R1310 Dysphagia, unspecified: Secondary | ICD-10-CM | POA: Diagnosis not present

## 2020-05-12 ENCOUNTER — Other Ambulatory Visit: Payer: Self-pay | Admitting: Family Medicine

## 2020-05-12 DIAGNOSIS — I10 Essential (primary) hypertension: Secondary | ICD-10-CM

## 2020-05-13 DIAGNOSIS — L578 Other skin changes due to chronic exposure to nonionizing radiation: Secondary | ICD-10-CM | POA: Diagnosis not present

## 2020-05-13 DIAGNOSIS — D2262 Melanocytic nevi of left upper limb, including shoulder: Secondary | ICD-10-CM | POA: Diagnosis not present

## 2020-05-13 DIAGNOSIS — D225 Melanocytic nevi of trunk: Secondary | ICD-10-CM | POA: Diagnosis not present

## 2020-05-13 DIAGNOSIS — L814 Other melanin hyperpigmentation: Secondary | ICD-10-CM | POA: Diagnosis not present

## 2020-05-13 DIAGNOSIS — C4441 Basal cell carcinoma of skin of scalp and neck: Secondary | ICD-10-CM | POA: Diagnosis not present

## 2020-05-13 DIAGNOSIS — L570 Actinic keratosis: Secondary | ICD-10-CM | POA: Diagnosis not present

## 2020-05-13 DIAGNOSIS — D485 Neoplasm of uncertain behavior of skin: Secondary | ICD-10-CM | POA: Diagnosis not present

## 2020-06-05 ENCOUNTER — Other Ambulatory Visit: Payer: Self-pay | Admitting: Family Medicine

## 2020-06-05 DIAGNOSIS — I10 Essential (primary) hypertension: Secondary | ICD-10-CM

## 2020-06-14 NOTE — Telephone Encounter (Signed)
Left a voicemail and sent a My Chart message informing him to call and schedule an appointment.

## 2020-06-17 DIAGNOSIS — C4441 Basal cell carcinoma of skin of scalp and neck: Secondary | ICD-10-CM | POA: Diagnosis not present

## 2020-06-18 ENCOUNTER — Other Ambulatory Visit: Payer: Self-pay | Admitting: Family Medicine

## 2020-06-18 DIAGNOSIS — I10 Essential (primary) hypertension: Secondary | ICD-10-CM

## 2020-07-01 ENCOUNTER — Telehealth: Payer: Self-pay | Admitting: Family Medicine

## 2020-07-01 DIAGNOSIS — I10 Essential (primary) hypertension: Secondary | ICD-10-CM

## 2020-07-02 ENCOUNTER — Other Ambulatory Visit: Payer: Self-pay

## 2020-07-02 DIAGNOSIS — I10 Essential (primary) hypertension: Secondary | ICD-10-CM

## 2020-07-02 MED ORDER — ENALAPRIL MALEATE 20 MG PO TABS: 40.0000 mg | ORAL_TABLET | Freq: Every day | ORAL | 0 refills | Status: DC

## 2020-07-02 NOTE — Telephone Encounter (Signed)
Caller Name: Duglas Call back phone #: [phone]  MEDICATION(S): enalapril (VASOTEC) 20 MG tablet  Has the patient contacted their pharmacy? Yes.   (IF NO, request that the patient contact the pharmacy for the refill. IF YES, when and what did the pharmacy advise?) Pt was advised RX was denied due to needing appt. Pt is scheduled for first available on 10/22. Pt took last dose this morning and needs meds refilled until his appointment. Please advise.  Preferred Pharmacy: [facility]  Please advise patient that RX refills may take up to 3 business days. We ask that the patient follow-up with their pharmacy.

## 2020-07-02 NOTE — Telephone Encounter (Signed)
Patient calling for 2 week supply of pending medication until he is able to come in for OV on 07/16/20. Last OV 11/21/18 please advise.

## 2020-07-09 ENCOUNTER — Other Ambulatory Visit: Payer: Self-pay | Admitting: Family Medicine

## 2020-07-09 DIAGNOSIS — I10 Essential (primary) hypertension: Secondary | ICD-10-CM

## 2020-07-15 ENCOUNTER — Other Ambulatory Visit: Payer: Self-pay

## 2020-07-16 ENCOUNTER — Encounter: Payer: Self-pay | Admitting: Family Medicine

## 2020-07-16 ENCOUNTER — Ambulatory Visit: Payer: BC Managed Care – PPO | Admitting: Family Medicine

## 2020-07-16 VITALS — BP 132/78 | HR 75 | Temp 98.2°F | Ht 75.0 in | Wt 249.2 lb

## 2020-07-16 DIAGNOSIS — Z0000 Encounter for general adult medical examination without abnormal findings: Secondary | ICD-10-CM

## 2020-07-16 DIAGNOSIS — Z7289 Other problems related to lifestyle: Secondary | ICD-10-CM

## 2020-07-16 DIAGNOSIS — Z789 Other specified health status: Secondary | ICD-10-CM

## 2020-07-16 DIAGNOSIS — R0982 Postnasal drip: Secondary | ICD-10-CM

## 2020-07-16 DIAGNOSIS — I10 Essential (primary) hypertension: Secondary | ICD-10-CM | POA: Diagnosis not present

## 2020-07-16 DIAGNOSIS — Z91199 Patient's noncompliance with other medical treatment and regimen due to unspecified reason: Secondary | ICD-10-CM

## 2020-07-16 DIAGNOSIS — Z9119 Patient's noncompliance with other medical treatment and regimen: Secondary | ICD-10-CM | POA: Diagnosis not present

## 2020-07-16 DIAGNOSIS — R748 Abnormal levels of other serum enzymes: Secondary | ICD-10-CM

## 2020-07-16 HISTORY — DX: Postnasal drip: R09.82

## 2020-07-16 HISTORY — DX: Patient's noncompliance with other medical treatment and regimen due to unspecified reason: Z91.199

## 2020-07-16 LAB — COMPREHENSIVE METABOLIC PANEL
ALT: 38 U/L (ref 0–53)
AST: 52 U/L — ABNORMAL HIGH (ref 0–37)
Albumin: 4.7 g/dL (ref 3.5–5.2)
Alkaline Phosphatase: 56 U/L (ref 39–117)
BUN: 7 mg/dL (ref 6–23)
CO2: 29 mEq/L (ref 19–32)
Calcium: 9.7 mg/dL (ref 8.4–10.5)
Chloride: 101 mEq/L (ref 96–112)
Creatinine, Ser: 0.75 mg/dL (ref 0.40–1.50)
GFR: 105.58 mL/min (ref >60.00)
Glucose, Bld: 72 mg/dL (ref 70–99)
Potassium: 4.5 mEq/L (ref 3.5–5.1)
Sodium: 141 mEq/L (ref 135–145)
Total Bilirubin: 0.7 mg/dL (ref 0.2–1.2)
Total Protein: 6.8 g/dL (ref 6.0–8.3)

## 2020-07-16 LAB — CBC
HCT: 42.7 % (ref 39.0–52.0)
Hemoglobin: 14.4 g/dL (ref 13.0–17.0)
MCHC: 33.8 g/dL (ref 30.0–36.0)
MCV: 101.7 fl — ABNORMAL HIGH (ref 78.0–100.0)
Platelets: 252 10*3/uL (ref 150.0–400.0)
RBC: 4.2 Mil/uL — ABNORMAL LOW (ref 4.22–5.81)
RDW: 13.5 % (ref 11.5–15.5)
WBC: 5.3 10*3/uL (ref 4.0–10.5)

## 2020-07-16 MED ORDER — ENALAPRIL MALEATE 20 MG PO TABS: 40.0000 mg | ORAL_TABLET | Freq: Every day | ORAL | 1 refills | Status: DC

## 2020-07-16 MED ORDER — MOMETASONE FUROATE 50 MCG/ACT NA SUSP: 2.0000 | Freq: Every day | NASAL | 4 refills | Status: DC

## 2020-07-16 NOTE — Progress Notes (Addendum)
Established Patient Office Visit  Subjective:  Patient ID: Gilbert [name], male    DOB: April 12, 1971  Age: 49 y.o. MRN: [id number]  CC:  Chief Complaint  Patient presents with  . Follow-up    follow up on BP refill on medications. No concerns.     HPI [name] presents for follow-up for hypertension.  Lost to follow-up for some time now.  Continues to take enalapril without issue.  Has occasional cough but feels as though it is not bad enough to change medications at this point.  He has ongoing postnasal drip with some nausea.  This seems to be a seasonal issue for him that is worse in the spring and fall.  Not currently using any medications for this.  He has been asked to make appointments to see me before today until now has failed to do so.  Tells me that he works a lot of hours.  Family dynamics are involved at his current employer and this is difficult.  Tells me that he has moderated his drinking.  He tells me that things are okay with his wife as far as this issue is concerned.  You do take  Past Medical History:  Diagnosis Date  . Chronic back pain   . GERD (gastroesophageal reflux disease)   . Hepatitis    unknown type many yrs ago - no residual problems  . Hypertension   . Insomnia   . Morbid obesity ([place])   . Ocular rosacea     Past Surgical History:  Procedure Laterality Date  . ANKLE ARTHROSCOPY WITH RECONSTRUCTION Right 09/26/2018   Procedure: ANKLE DEBRIDEMENT WITH POSSIBLE LATERAL LIGAMENT  RECONSTRUCTION;  Surgeon: Erle Crocker, MD;  Location: [facility];  Service: Orthopedics;  Laterality: Right;  . HIATAL HERNIA REPAIR  04/23/2012   Procedure: HERNIA REPAIR HIATAL;  Surgeon: Pedro Earls, MD;  Location: [facility];  Service: General;  Laterality: N/A;  . KNEE SURGERY Right    fracture knee cap  . LAPAROSCOPIC APPENDECTOMY N/A 07/31/2018   Procedure: APPENDECTOMY LAPAROSCOPIC;  Surgeon: Jovita Kussmaul, MD;  Location: [facility];  Service: General;  Laterality:  N/A;  . LAPAROSCOPIC GASTRIC BANDING  04/23/2012   Procedure: LAPAROSCOPIC GASTRIC BANDING;  Surgeon: Pedro Earls, MD;  Location: [facility];  Service: General;  Laterality: N/A;  . LASIK      Family History  Problem Relation Age of Onset  . Hypertension Father   . Diabetes Father   . Heart disease Father        pacemaker    Social History   Socioeconomic History  . Marital status: Married    Spouse name: Not on file  . Number of children: Not on file  . Years of education: Not on file  . Highest education level: Not on file  Occupational History  . Not on file  Tobacco Use  . Smoking status: Never Smoker  . Smokeless tobacco: Never Used  Vaping Use  . Vaping Use: Never used  Substance and Sexual Activity  . Alcohol use: Yes    Alcohol/week: 5.0 standard drinks    Types: 5 Shots of liquor per week    Comment: social  . Drug use: No  . Sexual activity: Not on file  Other Topics Concern  . Not on file  Social History Narrative  . Not on file   Social Determinants of Health   Financial Resource Strain:   . Difficulty of Paying Living Expenses: Not  on file  Food Insecurity:   . Worried About Charity fundraiser in the Last Year: Not on file  . Ran Out of Food in the Last Year: Not on file  Transportation Needs:   . Lack of Transportation (Medical): Not on file  . Lack of Transportation (Non-Medical): Not on file  Physical Activity:   . Days of Exercise per Week: Not on file  . Minutes of Exercise per Session: Not on file  Stress:   . Feeling of Stress : Not on file  Social Connections:   . Frequency of Communication with Friends and Family: Not on file  . Frequency of Social Gatherings with Friends and Family: Not on file  . Attends Religious Services: Not on file  . Active Member of Clubs or Organizations: Not on file  . Attends Archivist Meetings: Not on file  . Marital Status: Not on file  Intimate Partner Violence:   . Fear of Current or  Ex-Partner: Not on file  . Emotionally Abused: Not on file  . Physically Abused: Not on file  . Sexually Abused: Not on file    Outpatient Medications Prior to Visit  Medication Sig Dispense Refill  . ibuprofen (ADVIL,MOTRIN) 200 MG tablet Take 600 mg by mouth 3 (three) times daily.    . enalapril (VASOTEC) 20 MG tablet TAKE 2 TABLETS BY MOUTH EVERY DAY 30 tablet 0  . hydrocortisone (ANUSOL-HC) 2.5 % rectal cream Place 1 application rectally 2 (two) times daily. (Patient not taking: Reported on 07/16/2020) 30 g 0  . buPROPion (WELLBUTRIN) 75 MG tablet Take one pill twice daily (Patient not taking: Reported on 07/16/2020) 180 tablet 0  . fluticasone (FLONASE) 50 MCG/ACT nasal spray Place 2 sprays into both nostrils daily. (Patient not taking: Reported on 07/16/2020) 16 g 6  . traZODone (DESYREL) 50 MG tablet TAKE 1/2-1 TABLETS (25-50 MG TOTAL) BY MOUTH AT BEDTIME AS NEEDED FOR SLEEP. (Patient not taking: Reported on 07/16/2020) 90 tablet 0  . Vitamin D, Ergocalciferol, (DRISDOL) 1.25 MG (50000 UT) CAPS capsule TAKE 1 CAPSULE (50,000 UNITS TOTAL) BY MOUTH EVERY 7 (SEVEN) DAYS. (Patient not taking: Reported on 07/16/2020) 12 capsule 2   No facility-administered medications prior to visit.    Allergies  Allergen Reactions  . Penicillins Rash    Has patient had a PCN reaction causing immediate rash, facial/tongue/throat swelling, SOB or lightheadedness with hypotension: Unknown Has patient had a PCN reaction causing severe rash involving mucus membranes or skin necrosis: Unknown Has patient had a PCN reaction that required hospitalization: Unknown Has patient had a PCN reaction occurring within the last 10 years: No If all of the above answers are "NO", then may proceed with Cephalosporin use.     ROS Review of Systems  Constitutional: Negative.   HENT: Positive for congestion and postnasal drip. Negative for trouble swallowing and voice change.   Eyes: Negative for photophobia and  visual disturbance.  Respiratory: Positive for cough. Negative for shortness of breath and wheezing.   Gastrointestinal: Positive for nausea. Negative for abdominal pain, anal bleeding and vomiting.  Endocrine: Negative for polyphagia and polyuria.  Genitourinary: Negative.   Musculoskeletal: Positive for arthralgias. Negative for gait problem.  Neurological: Negative for tremors and speech difficulty.  Hematological: Does not bruise/bleed easily.      Objective:    Physical Exam Vitals and nursing note reviewed.  Constitutional:      General: He is not in acute distress.    Appearance: Normal appearance.  He is not ill-appearing, toxic-appearing or diaphoretic.  HENT:     Head: Normocephalic and atraumatic.     Right Ear: Tympanic membrane, ear canal and external ear normal.     Left Ear: Tympanic membrane, ear canal and external ear normal.     Mouth/Throat:     Mouth: Mucous membranes are moist.     Pharynx: Oropharynx is clear. No oropharyngeal exudate or posterior oropharyngeal erythema.  Eyes:     General: No scleral icterus.       Right eye: No discharge.        Left eye: No discharge.     Conjunctiva/sclera: Conjunctivae normal.     Pupils: Pupils are equal, round, and reactive to light.  Cardiovascular:     Rate and Rhythm: Normal rate and regular rhythm.  Pulmonary:     Effort: Pulmonary effort is normal.     Breath sounds: Normal breath sounds.  Musculoskeletal:     Cervical back: No rigidity or tenderness.     Right lower leg: No edema.     Left lower leg: No edema.  Lymphadenopathy:     Cervical: No cervical adenopathy.  Skin:    General: Skin is warm and dry.  Neurological:     Mental Status: He is alert and oriented to person, place, and time.  Psychiatric:        Mood and Affect: Mood normal.        Behavior: Behavior normal.     BP 132/78   Pulse 75   Temp 98.2 F (36.8 C) (Tympanic)   Ht 6\' 3"  (1.905 m)   Wt 249 lb 3.2 oz (113 kg)   SpO2 96%    BMI 31.15 kg/m  Wt Readings from Last 3 Encounters:  07/16/20 249 lb 3.2 oz (113 kg)  11/21/18 239 lb (108.4 kg)  09/20/18 235 lb 8 oz (106.8 kg)     Health Maintenance Due  Topic Date Due  . Hepatitis C Screening  Never done  . TETANUS/TDAP  09/27/2019  . COLONOSCOPY  Never done    There are no preventive care reminders to display for this patient.  Lab Results  Component Value Date   TSH 0.92 09/10/2017   Lab Results  Component Value Date   WBC 5.3 07/16/2020   HGB 14.4 07/16/2020   HCT 42.7 07/16/2020   MCV 101.7 (H) 07/16/2020   PLT 252.0 07/16/2020   Lab Results  Component Value Date   NA 141 07/16/2020   K 4.5 07/16/2020   CO2 29 07/16/2020   GLUCOSE 72 07/16/2020   BUN 7 07/16/2020   CREATININE 0.75 07/16/2020   BILITOT 0.7 07/16/2020   ALKPHOS 56 07/16/2020   AST 52 (H) 07/16/2020   ALT 38 07/16/2020   PROT 6.8 07/16/2020   ALBUMIN 4.7 07/16/2020   CALCIUM 9.7 07/16/2020   ANIONGAP 13 07/31/2018   GFR 105.58 07/16/2020   Lab Results  Component Value Date   CHOL 174 09/20/2018   Lab Results  Component Value Date   HDL 86.40 09/20/2018   Lab Results  Component Value Date   LDLCALC 79 09/20/2018   Lab Results  Component Value Date   TRIG 45.0 09/20/2018   Lab Results  Component Value Date   CHOLHDL 2 09/20/2018   Lab Results  Component Value Date   HGBA1C 5.1 09/20/2018      Assessment & Plan:   Problem List Items Addressed This Visit      Cardiovascular and  Mediastinum   Essential hypertension - Primary   Relevant Medications   enalapril (VASOTEC) 20 MG tablet   Other Relevant Orders   CBC (Completed)   Comprehensive metabolic panel (Completed)     Other   Healthcare maintenance   Relevant Orders   Ambulatory referral to Gastroenterology   Alcohol use   Medically noncompliant   Post-nasal drip   Relevant Medications   mometasone (NASONEX) 50 MCG/ACT nasal spray   Elevated liver enzymes   Relevant Orders    Hepatitis C antibody   Hepatitis B Surface AntiGEN      Meds ordered this encounter  Medications  . enalapril (VASOTEC) 20 MG tablet    Sig: Take 2 tablets (40 mg total) by mouth daily.    Dispense:  180 tablet    Refill:  1  . mometasone (NASONEX) 50 MCG/ACT nasal spray    Sig: Place 2 sprays into the nose daily.    Dispense:  1 each    Refill:  4    Follow-up: Return in about 4 months (around 11/16/2020), or Return in the next 4-6 months for a physical exam..  Continue enalapril 40 mg daily for now.  Have referred for screening colonoscopy.  Discussed using Nasonex daily for at least 2 weeks to help with the postnasal drip.  Encouraged use of nasal saline as well.  Follow-up in 4 to 6 months for a physical exam.  Libby Maw, MD

## 2020-07-16 NOTE — Addendum Note (Signed)
Addended by: Jon Billings on: 07/16/2020 12:20 PM   Modules accepted: Orders

## 2020-07-19 DIAGNOSIS — R748 Abnormal levels of other serum enzymes: Secondary | ICD-10-CM | POA: Insufficient documentation

## 2020-07-19 HISTORY — DX: Abnormal levels of other serum enzymes: R74.8

## 2020-07-19 NOTE — Addendum Note (Signed)
Addended by: Jon Billings on: 07/19/2020 07:43 AM   Modules accepted: Orders

## 2020-07-21 ENCOUNTER — Other Ambulatory Visit: Payer: Self-pay

## 2020-07-21 NOTE — Addendum Note (Signed)
Addended by: Lynnea Ferrier on: 07/21/2020 03:58 PM   Modules accepted: Orders

## 2020-07-22 ENCOUNTER — Other Ambulatory Visit (INDEPENDENT_AMBULATORY_CARE_PROVIDER_SITE_OTHER): Payer: BC Managed Care – PPO

## 2020-07-22 DIAGNOSIS — R748 Abnormal levels of other serum enzymes: Secondary | ICD-10-CM

## 2020-07-23 LAB — HEPATITIS C ANTIBODY
Hepatitis C Ab: NONREACTIVE
SIGNAL TO CUT-OFF: 0.01 (ref <1.00)

## 2020-07-23 LAB — HEPATITIS B SURFACE ANTIGEN: Hepatitis B Surface Ag: NONREACTIVE

## 2020-07-27 ENCOUNTER — Telehealth: Payer: Self-pay

## 2020-07-27 NOTE — Telephone Encounter (Signed)
Patient calling to for lab results. Informed patient that doctor have not had a chance to view labs but we will call as soon as results are available and viewed. Please advise

## 2020-09-28 DIAGNOSIS — Z1152 Encounter for screening for COVID-19: Secondary | ICD-10-CM | POA: Diagnosis not present

## 2020-10-04 ENCOUNTER — Telehealth: Payer: Self-pay | Admitting: Family Medicine

## 2020-10-04 NOTE — Telephone Encounter (Signed)
Pt calling because he is on day 8 of having covid symptoms and that his oxygen has been going back and forth from 96 to 92. He said he did try to return to work today and he got dizzy and his legs started shaking so he went home. He wants to know what he should or can do to help out. Going to send to Doc of the day since provider not back yet

## 2020-10-04 NOTE — Telephone Encounter (Signed)
Pt calling to find out what to do about testing positive for Covid.  This is day 10, as he went back today and had to go home due to dizziness.  Please advise and read previous message.  Pt would like a response via Mychart. Thank you.

## 2020-10-05 ENCOUNTER — Telehealth: Payer: Self-pay

## 2020-10-05 ENCOUNTER — Other Ambulatory Visit: Payer: Self-pay

## 2020-10-05 ENCOUNTER — Telehealth: Payer: Self-pay | Admitting: Family Medicine

## 2020-10-05 ENCOUNTER — Telehealth (INDEPENDENT_AMBULATORY_CARE_PROVIDER_SITE_OTHER): Payer: BC Managed Care – PPO | Admitting: Medical

## 2020-10-05 VITALS — HR 81

## 2020-10-05 DIAGNOSIS — J3489 Other specified disorders of nose and nasal sinuses: Secondary | ICD-10-CM

## 2020-10-05 DIAGNOSIS — R059 Cough, unspecified: Secondary | ICD-10-CM

## 2020-10-05 DIAGNOSIS — I10 Essential (primary) hypertension: Secondary | ICD-10-CM

## 2020-10-05 DIAGNOSIS — R42 Dizziness and giddiness: Secondary | ICD-10-CM

## 2020-10-05 DIAGNOSIS — U071 COVID-19: Secondary | ICD-10-CM | POA: Diagnosis not present

## 2020-10-05 MED ORDER — BUDESONIDE-FORMOTEROL FUMARATE 160-4.5 MCG/ACT IN AERO: 2.0000 | INHALATION_SPRAY | Freq: Two times a day (BID) | RESPIRATORY_TRACT | 3 refills | Status: DC

## 2020-10-05 MED ORDER — BENZONATATE 100 MG PO CAPS: 100.0000 mg | ORAL_CAPSULE | Freq: Three times a day (TID) | ORAL | 0 refills | Status: DC | PRN

## 2020-10-05 MED ORDER — AZITHROMYCIN 250 MG PO TABS: ORAL_TABLET | ORAL | 0 refills | Status: DC

## 2020-10-05 MED ORDER — MECLIZINE HCL 12.5 MG PO TABS: 12.5000 mg | ORAL_TABLET | Freq: Three times a day (TID) | ORAL | 0 refills | Status: DC | PRN

## 2020-10-05 NOTE — Patient Instructions (Addendum)
Recent diagnosis of COVID  with history of [place] x2.  Presently has some persisting intermittent cough sinus pressure and recent moderate dizziness(severe brief yesterday).  Described yesterday low O2 sat percentage but today ambulating on room air in his house O2 sats 95-97%.  Oxygen did not decrease while ambulating.  Recommend that you continue to check your oxygen and if decreases to less than 94% please let us know.  If that were to be the case then would recommend getting chest x-ray.  Notify us if you have any wheezing or experience shortness of breath.  In addition notify us of any asymmetric calf swelling or pain behind knees.  Some dizziness that was described as moderate to severe yesterday but much improved today.  Gross motor function appears normal on video visit.  Dizziness can occur with COVID.  Also would make sure that you stay well-hydrated as dehydration can occur if not eating and drinking.  Dizziness can be associated with that dehydration.  Also nasal congestion and sinus pressure can be associated with dizziness.  Based on duration of illness and persisting nasal congestion as well as cough decided to go ahead and  start azithromycin antibiotic.  Prescribed benzonatate for cough.  We discussed possible use of oral prednisone but in your case thank better to use Symbicort inhaler.  Use vitamin D 4000 international units daily and zinc 50mg  daily.  Follow-up in 7 days or as needed.  Discussed delaying return to work date until Thursday.  Possibly even as late as Monday.  When you do return to work consider possible half days.  Also if you have freedom to take mid afternoon nap/lie down that might be helpful as well.

## 2020-10-05 NOTE — Progress Notes (Signed)
Subjective:    Patient ID: Gilbert [name], male    DOB: 1971/03/24, 50 y.o.   MRN: [id number]  HPI  Virtual Visit via Video Note  I connected with Gilbert [name] on 10/05/20 at  1:20 PM EST by a video enabled telemedicine application and verified that I am speaking with the correct person using two identifiers.  Location: Patient: home Provider: office  Participants- Pt and myself.   I discussed the limitations of evaluation and management by telemedicine and the availability of in person appointments. The patient expressed understanding and agreed to proceed.   History of Present Illness:  Pt states his got + covid result on Saturday. Pt wanted to return to work on Monday. He got extremity  light headed and almost passed out.  He has been trying to get in touch with his pcp. Given appt for next week so he got scheduled with [place] for virtual visit.   Tested ago  Tuesday. Symptoms for 9 days overall. He got result of test this Saturday.  Overall he had cold symptoms, 100.5 fever early on. Mild bodyaches.  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  Pt states he has been keeping well hydrated.  No obvious vertigo. Decreased appetite. Note eating much.   Pt states his 02 sat was 92% yesterday. 94% earlier. Some mild pain in his calfs. But no swelling of his calfs. No popliteal pain  Pt does have htn. Pt did not check his blood pressure today.  When on visit with me 95-96-97% on ambultion. Settling on 96% room air.  Pt light headed sensation improved today. Sinus pressure on palpation.    Observations/Objective:  General-no acute distress, pleasant, oriented. Lungs- on inspection lungs appear unlabored. Neck- no tracheal deviation or jvd on inspection. Neuro- gross motor function appears intact.   Assessment and Plan: Recent diagnosis of COVID  with history of [place] x2.  Presently has some persisting intermittent cough sinus pressure and recent moderate  dizziness(severe brief yesterday).  Described yesterday low O2 sat percentage but today ambulating on room air in his house O2 sats 95-97%.  Oxygen did not decrease while ambulating.  Recommend that you continue to check your oxygen and if decreases to less than 94% please let us know.  If that were to be the case then would recommend getting chest x-ray.  Notify us if you have any wheezing or experience shortness of breath.  In addition notify us of any asymmetric calf swelling or pain behind knees.  Some dizziness that was described as moderate to severe yesterday but much improved today.  Gross motor function appears normal on video visit.  Dizziness can occur with COVID.  Also would make sure that you stay well-hydrated as dehydration can occur if not eating and drinking.  Dizziness can be associated with that dehydration.  Also nasal congestion and sinus pressure can be associated with dizziness.  Based on duration of illness and persisting nasal congestion as well as cough decided to go ahead and  start azithromycin antibiotic.  Prescribed benzonatate for cough.  We discussed possible use of oral prednisone but in your case thank better to use Symbicort inhaler.  Use vitamin D 4000 international units daily and zinc 50mg  daily.  Follow-up in 7 days or as needed.  Discussed delaying return to work date until Thursday.  Possibly even as late as Monday.  When you do return to work consider possible half days.  Also if you  have freedom to take mid afternoon nap/lie down that might be helpful as well.  Time spent with patient today was 30- minutes which consisted of chart revdiew, discussing diagnosis, work up treatment and documentation.  Gilbert Pai, PA-C  Follow Up Instructions:    I discussed the assessment and treatment plan with the patient. The patient was provided an opportunity to ask questions and all were answered. The patient agreed with the plan and demonstrated an  understanding of the instructions.   The patient was advised to call back or seek an in-person evaluation if the symptoms worsen or if the condition fails to improve as anticipated.     Gilbert Pai, PA-C     Review of Systems  Constitutional: Negative for chills and fever.  HENT: Positive for congestion, sinus pressure and sinus pain. Negative for sore throat.   Respiratory: Negative for chest tightness, shortness of breath and wheezing.   Cardiovascular: Negative for chest pain and palpitations.  Gastrointestinal: Negative for abdominal pain.  Genitourinary: Negative for dysuria and enuresis.  Musculoskeletal: Negative for back pain.  Skin: Negative for rash.  Neurological: Positive for dizziness. Negative for seizures, speech difficulty, weakness and headaches.  Hematological: Negative for adenopathy. Does not bruise/bleed easily.  Psychiatric/Behavioral: Negative for sleep disturbance and suicidal ideas. The patient is not nervous/anxious.        Objective:   Physical Exam        Assessment & Plan:

## 2020-10-05 NOTE — Telephone Encounter (Signed)
Pt called back this morning and I informed him that the panel was full here.  I gave phone numbers to other offices for pt to call and check there availability.

## 2020-10-05 NOTE — Telephone Encounter (Signed)
Ok if pcp ok's.

## 2020-10-05 NOTE — Telephone Encounter (Signed)
Patient would like to switch pcp. Please advise

## 2020-10-07 NOTE — Telephone Encounter (Signed)
Gilbert [name] how would you like the next appointment to be scheduled (TOC or Office visit/CPE)? Please advise

## 2020-10-07 NOTE — Telephone Encounter (Signed)
Was unable to leave message (voice mail is full)

## 2020-10-07 NOTE — Telephone Encounter (Signed)
Pt currently has covid. You could call pt and offer in office visit for wellness exam in 3 weeks. Early morning appointment and come fasting. How is he doing regarding recent covid?

## 2020-10-12 ENCOUNTER — Other Ambulatory Visit: Payer: Self-pay | Admitting: Family Medicine

## 2020-10-12 DIAGNOSIS — R0982 Postnasal drip: Secondary | ICD-10-CM

## 2020-11-17 ENCOUNTER — Encounter: Payer: Self-pay | Admitting: Medical

## 2020-11-17 ENCOUNTER — Other Ambulatory Visit: Payer: Self-pay

## 2020-11-17 ENCOUNTER — Ambulatory Visit (INDEPENDENT_AMBULATORY_CARE_PROVIDER_SITE_OTHER): Payer: BC Managed Care – PPO | Admitting: Medical

## 2020-11-17 ENCOUNTER — Encounter: Payer: BC Managed Care – PPO | Admitting: Family Medicine

## 2020-11-17 VITALS — BP 121/77 | HR 70 | Temp 98.1°F | Resp 20 | Ht 75.0 in | Wt 248.6 lb

## 2020-11-17 DIAGNOSIS — I10 Essential (primary) hypertension: Secondary | ICD-10-CM

## 2020-11-17 DIAGNOSIS — Z0000 Encounter for general adult medical examination without abnormal findings: Secondary | ICD-10-CM

## 2020-11-17 DIAGNOSIS — Z125 Encounter for screening for malignant neoplasm of prostate: Secondary | ICD-10-CM

## 2020-11-17 DIAGNOSIS — Z1211 Encounter for screening for malignant neoplasm of colon: Secondary | ICD-10-CM

## 2020-11-17 DIAGNOSIS — Z23 Encounter for immunization: Secondary | ICD-10-CM | POA: Diagnosis not present

## 2020-11-17 LAB — CBC WITH DIFFERENTIAL/PLATELET
Basophils Absolute: 0.1 10*3/uL (ref 0.0–0.1)
Basophils Relative: 2.3 % (ref 0.0–3.0)
Eosinophils Absolute: 0.1 10*3/uL (ref 0.0–0.7)
Eosinophils Relative: 3.5 % (ref 0.0–5.0)
HCT: 41.2 % (ref 39.0–52.0)
Hemoglobin: 14.1 g/dL (ref 13.0–17.0)
Lymphocytes Relative: 36.5 % (ref 12.0–46.0)
Lymphs Abs: 1.2 10*3/uL (ref 0.7–4.0)
MCHC: 34.1 g/dL (ref 30.0–36.0)
MCV: 102.5 fl — ABNORMAL HIGH (ref 78.0–100.0)
Monocytes Absolute: 0.3 10*3/uL (ref 0.1–1.0)
Monocytes Relative: 10 % (ref 3.0–12.0)
Neutro Abs: 1.5 10*3/uL (ref 1.4–7.7)
Neutrophils Relative %: 47.7 % (ref 43.0–77.0)
Platelets: 237 10*3/uL (ref 150.0–400.0)
RBC: 4.02 Mil/uL — ABNORMAL LOW (ref 4.22–5.81)
RDW: 13.3 % (ref 11.5–15.5)
WBC: 3.2 10*3/uL — ABNORMAL LOW (ref 4.0–10.5)

## 2020-11-17 LAB — COMPREHENSIVE METABOLIC PANEL
ALT: 29 U/L (ref 0–53)
AST: 43 U/L — ABNORMAL HIGH (ref 0–37)
Albumin: 4.2 g/dL (ref 3.5–5.2)
Alkaline Phosphatase: 49 U/L (ref 39–117)
BUN: 6 mg/dL (ref 6–23)
CO2: 32 mEq/L (ref 19–32)
Calcium: 9 mg/dL (ref 8.4–10.5)
Chloride: 102 mEq/L (ref 96–112)
Creatinine, Ser: 0.72 mg/dL (ref 0.40–1.50)
GFR: 106.63 mL/min (ref >60.00)
Glucose, Bld: 76 mg/dL (ref 70–99)
Potassium: 4.4 mEq/L (ref 3.5–5.1)
Sodium: 142 mEq/L (ref 135–145)
Total Bilirubin: 0.6 mg/dL (ref 0.2–1.2)
Total Protein: 6.4 g/dL (ref 6.0–8.3)

## 2020-11-17 LAB — LIPID PANEL
Cholesterol: 200 mg/dL (ref 0–200)
HDL: 123.5 mg/dL (ref >39.00)
LDL Cholesterol: 68 mg/dL (ref 0–99)
NonHDL: 76.9
Total CHOL/HDL Ratio: 2
Triglycerides: 45 mg/dL (ref 0.0–149.0)
VLDL: 9 mg/dL (ref 0.0–40.0)

## 2020-11-17 NOTE — Addendum Note (Signed)
Addended by: Jeronimo Greaves on: 11/17/2020 09:54 AM   Modules accepted: Orders

## 2020-11-17 NOTE — Progress Notes (Signed)
Subjective:    Patient ID: Gilbert [name], male    DOB: 1971-01-06, 50 y.o.   MRN: [id number]  HPI  Pt in for cpe/wellness exam.  Pt is fasting. Pt works at [employer] in [place], not exercising regularly. Moderate healthy diet but about 1/2 time unhealthy. Non smoker. 2 beers a day. Coffee in am and unsweet tea. No sodas.  Pt never had a colonoscopy.   Pt is overdue for tdap. Will get today.  Review of Systems  Constitutional: Negative for chills, fatigue and unexpected weight change.  HENT: Negative for congestion, ear discharge and ear pain.   Eyes: Negative for pain.  Respiratory: Negative for cough, chest tightness, shortness of breath and wheezing.   Cardiovascular: Negative for chest pain and palpitations.  Gastrointestinal: Negative for abdominal pain, blood in stool, constipation, diarrhea and vomiting.  Genitourinary: Negative for dysuria, flank pain, frequency and urgency.  Musculoskeletal: Negative for back pain.  Skin: Negative for rash.  Neurological: Negative for dizziness, speech difficulty, weakness, numbness and headaches.  Psychiatric/Behavioral: Negative for behavioral problems, confusion, sleep disturbance and suicidal ideas. The patient is not nervous/anxious.    Past Medical History:  Diagnosis Date  . Chronic back pain   . GERD (gastroesophageal reflux disease)   . Hepatitis    unknown type many yrs ago - no residual problems  . Hypertension   . Insomnia   . Morbid obesity ([place])   . Ocular rosacea      Social History   Socioeconomic History  . Marital status: Married    Spouse name: Not on file  . Number of children: Not on file  . Years of education: Not on file  . Highest education level: Not on file  Occupational History  . Not on file  Tobacco Use  . Smoking status: Never Smoker  . Smokeless tobacco: Never Used  Vaping Use  . Vaping Use: Never used  Substance and Sexual Activity  . Alcohol use: Yes    Alcohol/week: 5.0 standard  drinks    Types: 5 Shots of liquor per week    Comment: social  . Drug use: No  . Sexual activity: Not on file  Other Topics Concern  . Not on file  Social History Narrative  . Not on file   Social Determinants of Health   Financial Resource Strain: Not on file  Food Insecurity: Not on file  Transportation Needs: Not on file  Physical Activity: Not on file  Stress: Not on file  Social Connections: Not on file  Intimate Partner Violence: Not on file    Past Surgical History:  Procedure Laterality Date  . ANKLE ARTHROSCOPY WITH RECONSTRUCTION Right 09/26/2018   Procedure: ANKLE DEBRIDEMENT WITH POSSIBLE LATERAL LIGAMENT  RECONSTRUCTION;  Surgeon: Erle Crocker, MD;  Location: [facility];  Service: Orthopedics;  Laterality: Right;  . HIATAL HERNIA REPAIR  04/23/2012   Procedure: HERNIA REPAIR HIATAL;  Surgeon: Pedro Earls, MD;  Location: [facility];  Service: General;  Laterality: N/A;  . KNEE SURGERY Right    fracture knee cap  . LAPAROSCOPIC APPENDECTOMY N/A 07/31/2018   Procedure: APPENDECTOMY LAPAROSCOPIC;  Surgeon: Jovita Kussmaul, MD;  Location: [facility];  Service: General;  Laterality: N/A;  . LAPAROSCOPIC GASTRIC BANDING  04/23/2012   Procedure: LAPAROSCOPIC GASTRIC BANDING;  Surgeon: Pedro Earls, MD;  Location: [facility];  Service: General;  Laterality: N/A;  . LASIK      Family History  Problem Relation Age of Onset  .  Hypertension Father   . Diabetes Father   . Heart disease Father        pacemaker    Allergies  Allergen Reactions  . Penicillins Rash    Has patient had a PCN reaction causing immediate rash, facial/tongue/throat swelling, SOB or lightheadedness with hypotension: Unknown Has patient had a PCN reaction causing severe rash involving mucus membranes or skin necrosis: Unknown Has patient had a PCN reaction that required hospitalization: Unknown Has patient had a PCN reaction occurring within the last 10 years: No If all of the above answers are "NO",  then may proceed with Cephalosporin use.     Current Outpatient Medications on File Prior to Visit  Medication Sig Dispense Refill  . enalapril (VASOTEC) 20 MG tablet Take 2 tablets (40 mg total) by mouth daily. 180 tablet 1  . ibuprofen (ADVIL,MOTRIN) 200 MG tablet Take 600 mg by mouth 3 (three) times daily.    Marland Kitchen azithromycin (ZITHROMAX) 250 MG tablet Take 2 tablets by mouth on day 1, followed by 1 tablet by mouth daily for 4 days. (Patient not taking: Reported on 11/17/2020) 6 tablet 0  . benzonatate (TESSALON) 100 MG capsule Take 1 capsule (100 mg total) by mouth 3 (three) times daily as needed for cough. (Patient not taking: Reported on 11/17/2020) 30 capsule 0  . budesonide-formoterol (SYMBICORT) 160-4.5 MCG/ACT inhaler Inhale 2 puffs into the lungs 2 (two) times daily. (Patient not taking: Reported on 11/17/2020) 1 each 3  . hydrocortisone (ANUSOL-HC) 2.5 % rectal cream Place 1 application rectally 2 (two) times daily. (Patient not taking: No sig reported) 30 g 0  . meclizine (ANTIVERT) 12.5 MG tablet Take 1 tablet (12.5 mg total) by mouth 3 (three) times daily as needed for dizziness. (Patient not taking: Reported on 11/17/2020) 30 tablet 0  . mometasone (NASONEX) 50 MCG/ACT nasal spray PLACE 2 SPRAYS INTO THE NOSE DAILY. (Patient not taking: Reported on 11/17/2020) 51 each 1   No current facility-administered medications on file prior to visit.    BP 121/77   Pulse 70   Temp 98.1 F (36.7 C) (Oral)   Resp 20   Ht 6\' 3"  (1.905 m)   Wt 248 lb 9.6 oz (112.8 kg)   SpO2 99%   BMI 31.07 kg/m       Objective:   Physical Exam  General Mental Status- Alert. General Appearance- Not in acute distress.   Skin General: Color- Normal Color. Moisture- Normal Moisture.  Neck Carotid Arteries- Normal color. Moisture- Normal Moisture. No carotid bruits. No JVD.  Chest and Lung Exam Auscultation: Breath Sounds:-Normal.  Cardiovascular Auscultation:Rythm- Regular. Murmurs & Other  Heart Sounds:Auscultation of the heart reveals- No Murmurs.  Abdomen Inspection:-Inspeection Normal. Palpation/Percussion:Note:No mass. Palpation and Percussion of the abdomen reveal- Non Tender, Non Distended + BS, no rebound or guarding.   Neurologic Cranial Nerve exam:- CN III-XII intact(No nystagmus), symmetric smile. Strength:- 5/5 equal and symmetric strength both upper and lower extremities.      Assessment & Plan:  For you wellness exam today I have ordered cbc, cmp, lipid panel and psa.  Vaccine today tdap.  Recommend exercise and healthy diet.  We will let you know lab results as they come in.  Follow up date appointment will be determined after lab review.  Mackie Pai, PA-C

## 2020-11-17 NOTE — Patient Instructions (Addendum)
For you wellness exam today I have ordered cbc, cmp, lipid panel and psa.  Vaccine today tdap.  Recommend exercise and healthy diet.  We will let you know lab results as they come in.  Follow up date appointment will be determined after lab review.   Random residual cough and decrease appetite post covid. I think that will resolve by 2 weeks. If symptom persist or worsen let me know.   Preventive Care 42-51 Years Old, Male Preventive care refers to lifestyle choices and visits with your health care provider that can promote health and wellness. This includes:  A yearly physical exam. This is also called an annual wellness visit.  Regular dental and eye exams.  Immunizations.  Screening for certain conditions.  Healthy lifestyle choices, such as: ? Eating a healthy diet. ? Getting regular exercise. ? Not using drugs or products that contain nicotine and tobacco.  ? Limiting alcohol use. What can I expect for my preventive care visit? Physical exam Your health care provider will check your:  Height and weight. These may be used to calculate your BMI (body mass index). BMI is a measurement that tells if you are at a healthy weight.  Heart rate and blood pressure.  Body temperature.  Skin for abnormal spots. Counseling Your health care provider may ask you questions about your:  Past medical problems.  Family's medical history.  Alcohol, tobacco, and drug use.  Emotional well-being.  Home life and relationship well-being.  Sexual activity.  Diet, exercise, and sleep habits.  Work and work Statistician.  Access to firearms. What immunizations do I need? Vaccines are usually given at various ages, according to a schedule. Your health care provider will recommend vaccines for you based on your age, medical history, and lifestyle or other factors, such as travel or where you work.   What tests do I need? Blood tests  Lipid and cholesterol levels. These may be  checked every 5 years, or more often if you are over 75 years old.  Hepatitis C test.  Hepatitis B test. Screening  Lung cancer screening. You may have this screening every year starting at age 90 if you have a 30-pack-year history of smoking and currently smoke or have quit within the past 15 years.  Prostate cancer screening. Recommendations will vary depending on your family history and other risks.  Genital exam to check for testicular cancer or hernias.  Colorectal cancer screening. ? All adults should have this screening starting at age 48 and continuing until age 21. ? Your health care provider may recommend screening at age 51 if you are at increased risk. ? You will have tests every 1-10 years, depending on your results and the type of screening test.  Diabetes screening. ? This is done by checking your blood sugar (glucose) after you have not eaten for a while (fasting). ? You may have this done every 1-3 years.  STD (sexually transmitted disease) testing, if you are at risk. Follow these instructions at home: Eating and drinking  Eat a diet that includes fresh fruits and vegetables, whole grains, lean protein, and low-fat dairy products.  Take vitamin and mineral supplements as recommended by your health care provider.  Do not drink alcohol if your health care provider tells you not to drink.  If you drink alcohol: ? Limit how much you have to 0-2 drinks a day. ? Be aware of how much alcohol is in your drink. In the [place] one drink equals one 12  oz bottle of beer (355 mL), one 5 oz glass of wine (148 mL), or one 1 oz glass of hard liquor (44 mL).   Lifestyle  Take daily care of your teeth and gums. Brush your teeth every morning and night with fluoride toothpaste. Floss one time each day.  Stay active. Exercise for at least 30 minutes 5 or more days each week.  Do not use any products that contain nicotine or tobacco, such as cigarettes, e-cigarettes, and chewing  tobacco. If you need help quitting, ask your health care provider.  Do not use drugs.  If you are sexually active, practice safe sex. Use a condom or other form of protection to prevent STIs (sexually transmitted infections).  If told by your health care provider, take low-dose aspirin daily starting at age 34.  Find healthy ways to cope with stress, such as: ? Meditation, yoga, or listening to music. ? Journaling. ? Talking to a trusted person. ? Spending time with friends and family. Safety  Always wear your seat belt while driving or riding in a vehicle.  Do not drive: ? If you have been drinking alcohol. Do not ride with someone who has been drinking. ? When you are tired or distracted. ? While texting.  Wear a helmet and other protective equipment during sports activities.  If you have firearms in your house, make sure you follow all gun safety procedures. What's next?  Go to your health care provider once a year for an annual wellness visit.  Ask your health care provider how often you should have your eyes and teeth checked.  Stay up to date on all vaccines. This information is not intended to replace advice given to you by your health care provider. Make sure you discuss any questions you have with your health care provider. Document Revised: 06/10/2019 Document Reviewed: 09/05/2018 Elsevier Patient Education  2021 [employer].

## 2020-11-18 ENCOUNTER — Telehealth: Payer: Self-pay | Admitting: Medical

## 2020-11-18 NOTE — Telephone Encounter (Signed)
Opened by accident

## 2021-01-19 ENCOUNTER — Other Ambulatory Visit: Payer: Self-pay | Admitting: Family Medicine

## 2021-01-19 DIAGNOSIS — I10 Essential (primary) hypertension: Secondary | ICD-10-CM

## 2021-02-09 ENCOUNTER — Ambulatory Visit (AMBULATORY_SURGERY_CENTER): Payer: BC Managed Care – PPO | Admitting: *Deleted

## 2021-02-09 ENCOUNTER — Other Ambulatory Visit: Payer: Self-pay

## 2021-02-09 VITALS — Ht 75.0 in | Wt 250.0 lb

## 2021-02-09 DIAGNOSIS — Z1211 Encounter for screening for malignant neoplasm of colon: Secondary | ICD-10-CM

## 2021-02-09 MED ORDER — PEG 3350-KCL-NA BICARB-NACL 420 G PO SOLR: 4000.0000 mL | Freq: Once | ORAL | 0 refills | Status: AC

## 2021-02-09 NOTE — Progress Notes (Addendum)
No egg or soy allergy known to patient  No issues with past sedation with any surgeries or procedures Patient denies ever being told they had issues or difficulty with intubation  No FH of Malignant Hyperthermia No diet pills per patient No home 02 use per patient  No blood thinners per patient  Pt denies issues with constipation  No A fib or A flutter  EMMI video to pt or via [place] 19 guidelines implemented in [place] today with Pt and RN  Pt is fully vaccinated  for Covid   Virtual pre visit completed. Instructions mailed to patient.  Due to the COVID-19 pandemic we are asking patients to follow certain guidelines.  Pt aware of COVID protocols and [facility] guidelines

## 2021-02-10 ENCOUNTER — Encounter: Payer: BC Managed Care – PPO | Admitting: Gastroenterology

## 2021-02-18 ENCOUNTER — Encounter: Payer: Self-pay | Admitting: Gastroenterology

## 2021-02-23 ENCOUNTER — Encounter: Payer: BC Managed Care – PPO | Admitting: Gastroenterology

## 2021-02-25 ENCOUNTER — Encounter: Payer: Self-pay | Admitting: Gastroenterology

## 2021-02-25 ENCOUNTER — Other Ambulatory Visit: Payer: Self-pay

## 2021-02-25 ENCOUNTER — Ambulatory Visit (AMBULATORY_SURGERY_CENTER): Payer: BC Managed Care – PPO | Admitting: Gastroenterology

## 2021-02-25 VITALS — BP 140/78 | HR 63 | Temp 98.6°F | Resp 21 | Ht 75.0 in | Wt 250.0 lb

## 2021-02-25 DIAGNOSIS — K635 Polyp of colon: Secondary | ICD-10-CM

## 2021-02-25 DIAGNOSIS — D125 Benign neoplasm of sigmoid colon: Secondary | ICD-10-CM

## 2021-02-25 DIAGNOSIS — Z1211 Encounter for screening for malignant neoplasm of colon: Secondary | ICD-10-CM | POA: Diagnosis not present

## 2021-02-25 DIAGNOSIS — D122 Benign neoplasm of ascending colon: Secondary | ICD-10-CM

## 2021-02-25 MED ORDER — SODIUM CHLORIDE 0.9 % IV SOLN: 500.0000 mL | Freq: Once | INTRAVENOUS | Status: DC

## 2021-02-25 NOTE — Progress Notes (Signed)
Called to room to assist during endoscopic procedure.  Patient ID and intended procedure confirmed with present staff. Received instructions for my participation in the procedure from the performing physician.  

## 2021-02-25 NOTE — Patient Instructions (Signed)
No aspirin, ibuprofen or aleve for 5 days post procedure.  Read all of the handouts given to you by your recovery room  Nurse.  YOU HAD AN ENDOSCOPIC PROCEDURE TODAY AT [facility]:   Refer to the procedure report that was given to you for any specific questions about what was found during the examination.  If the procedure report does not answer your questions, please call your gastroenterologist to clarify.  If you requested that your care partner not be given the details of your procedure findings, then the procedure report has been included in a sealed envelope for you to review at your convenience later.  YOU SHOULD EXPECT: Some feelings of bloating in the abdomen. Passage of more gas than usual.  Walking can help get rid of the air that was put into your GI tract during the procedure and reduce the bloating. If you had a lower endoscopy (such as a colonoscopy or flexible sigmoidoscopy) you may notice spotting of blood in your stool or on the toilet paper. If you underwent a bowel prep for your procedure, you may not have a normal bowel movement for a few days.  Please Note:  You might notice some irritation and congestion in your nose or some drainage.  This is from the oxygen used during your procedure.  There is no need for concern and it should clear up in a day or so.  SYMPTOMS TO REPORT IMMEDIATELY:   Following lower endoscopy (colonoscopy or flexible sigmoidoscopy):  Excessive amounts of blood in the stool  Significant tenderness or worsening of abdominal pains  Swelling of the abdomen that is new, acute  Fever of 100F or higher   For urgent or emergent issues, a gastroenterologist can be reached at any hour by calling [phone]. Do not use MyChart messaging for urgent concerns.    DIET:  We do recommend a small meal at first, but then you may proceed to your regular diet.  Drink plenty of fluids but you should avoid alcoholic beverages for 24  hours.  ACTIVITY:  You should plan to take it easy for the rest of today and you should NOT DRIVE or use heavy machinery until tomorrow (because of the sedation medicines used during the test).    FOLLOW UP: Our staff will call the number listed on your records 48-72 hours following your procedure to check on you and address any questions or concerns that you may have regarding the information given to you following your procedure. If we do not reach you, we will leave a message.  We will attempt to reach you two times.  During this call, we will ask if you have developed any symptoms of COVID 19. If you develop any symptoms (ie: fever, flu-like symptoms, shortness of breath, cough etc.) before then, please call [phone].  If you test positive for Covid 19 in the 2 weeks post procedure, please call and report this information to [place].    If any biopsies were taken you will be contacted by phone or by letter within the next 1-3 weeks.  Please call us at [phone] if you have not heard about the biopsies in 3 weeks.    SIGNATURES/CONFIDENTIALITY: You and/or your care partner have signed paperwork which will be entered into your electronic medical record.  These signatures attest to the fact that that the information above on your After Visit Summary has been reviewed and is understood.  Full responsibility of the confidentiality of this  discharge information lies with you and/or your care-partner. 

## 2021-02-25 NOTE — Progress Notes (Signed)
Pt's states no medical or surgical changes since previsit or office visit.   Check-in-tanacia Vital signs-sb

## 2021-02-25 NOTE — Telephone Encounter (Signed)
Patient has new PCP

## 2021-02-25 NOTE — Op Note (Signed)
[facility] Patient Name: Gilbert [name] Procedure Date: 02/25/2021 9:33 AM MRN: [id number] Endoscopist: Jackquline Denmark , MD Age: 50 Referring MD:  Date of Birth: 1971/09/01 Gender: Male Account #: [id number] Procedure:                Colonoscopy Indications:              Screening for colorectal malignant neoplasm Medicines:                Monitored Anesthesia Care Procedure:                Pre-Anesthesia Assessment:                           - Prior to the procedure, a History and Physical                            was performed, and patient medications and                            allergies were reviewed. The patient's tolerance of                            previous anesthesia was also reviewed. The risks                            and benefits of the procedure and the sedation                            options and risks were discussed with the patient.                            All questions were answered, and informed consent                            was obtained. Prior Anticoagulants: The patient has                            taken no previous anticoagulant or antiplatelet                            agents. ASA Grade Assessment: II - A patient with                            mild systemic disease. After reviewing the risks                            and benefits, the patient was deemed in                            satisfactory condition to undergo the procedure.                           After obtaining informed consent, the colonoscope  was passed under direct vision. Throughout the                            procedure, the patient's blood pressure, pulse, and                            oxygen saturations were monitored continuously. The                            Olympus CF-[id number]) Colonoscope was                            introduced through the anus and advanced to the 2                            cm into the ileum. The  colonoscopy was performed                            without difficulty. The patient tolerated the                            procedure well. The quality of the bowel                            preparation was good. The terminal ileum, ileocecal                            valve, appendiceal orifice, and rectum were                            photographed. Scope In: 9:42:40 AM Scope Out: 9:54:24 AM Scope Withdrawal Time: 0 hours 9 minutes 13 seconds  Total Procedure Duration: 0 hours 11 minutes 44 seconds  Findings:                 Two sessile polyps were found in the distal sigmoid                            colon and proximal descending colon. The polyps                            were 6 to 8 mm in size. These polyps were removed                            with a cold snare. Resection and retrieval were                            complete.                           Non-bleeding internal hemorrhoids were found during                            retroflexion. The hemorrhoids were small.  The terminal ileum appeared normal.                           The exam was otherwise without abnormality on                            direct and retroflexion views. Complications:            No immediate complications. Estimated Blood Loss:     Estimated blood loss: none. Impression:               - Two 6 to 8 mm polyps in the distal sigmoid colon                            and in the proximal descending colon, removed with                            a cold snare. Resected and retrieved.                           - Non-bleeding internal hemorrhoids.                           - The examined portion of the ileum was normal.                           - The examination was otherwise normal on direct                            and retroflexion views. Recommendation:           - Patient has a contact number available for                            emergencies. The signs and symptoms of  potential                            delayed complications were discussed with the                            patient. Return to normal activities tomorrow.                            Written discharge instructions were provided to the                            patient.                           - Resume previous diet.                           - Continue present medications.                           - No aspirin, ibuprofen, naproxen, or other  non-steroidal anti-inflammatory drugs for 5 days                            after polyp removal.                           - Await pathology results.                           - Repeat colonoscopy for surveillance based on                            pathology results.                           - The findings and recommendations were discussed                            with the patient's family. Jackquline Denmark, MD 02/25/2021 9:57:48 AM This report has been signed electronically.

## 2021-02-25 NOTE — Progress Notes (Signed)
PT taken to PACU. Monitors in place. VSS. Report given to RN. 

## 2021-03-01 ENCOUNTER — Telehealth: Payer: Self-pay | Admitting: *Deleted

## 2021-03-01 NOTE — Telephone Encounter (Signed)
  Follow up Call-  Call back number 02/25/2021  Post procedure Call Back phone  # [phone]  Permission to leave phone message Yes  Some recent data might be hidden     Patient questions:  Do you have a fever, pain , or abdominal swelling? No. Pain Score  0 *  Have you tolerated food without any problems? Yes.    Have you been able to return to your normal activities? Yes.    Do you have any questions about your discharge instructions: Diet   No. Medications  No. Follow up visit  No.  Do you have questions or concerns about your Care? No.  Actions: * If pain score is 4 or above: No action needed, pain <4.  1. Have you developed a fever since your procedure? no  2.   Have you had an respiratory symptoms (SOB or cough) since your procedure? no  3.   Have you tested positive for COVID 19 since your procedure no  4.   Have you had any family members/close contacts diagnosed with the COVID 19 since your procedure?  no   If yes to any of these questions please route to Joylene John, RN and Joella Prince, RN

## 2021-03-10 ENCOUNTER — Encounter: Payer: Self-pay | Admitting: Gastroenterology

## 2021-04-27 ENCOUNTER — Other Ambulatory Visit: Payer: Self-pay | Admitting: Family Medicine

## 2021-04-27 DIAGNOSIS — I10 Essential (primary) hypertension: Secondary | ICD-10-CM

## 2021-05-23 DIAGNOSIS — D485 Neoplasm of uncertain behavior of skin: Secondary | ICD-10-CM | POA: Diagnosis not present

## 2021-05-23 DIAGNOSIS — D2262 Melanocytic nevi of left upper limb, including shoulder: Secondary | ICD-10-CM | POA: Diagnosis not present

## 2021-05-23 DIAGNOSIS — L570 Actinic keratosis: Secondary | ICD-10-CM | POA: Diagnosis not present

## 2021-05-23 DIAGNOSIS — C44319 Basal cell carcinoma of skin of other parts of face: Secondary | ICD-10-CM | POA: Diagnosis not present

## 2021-05-23 DIAGNOSIS — L814 Other melanin hyperpigmentation: Secondary | ICD-10-CM | POA: Diagnosis not present

## 2021-05-23 DIAGNOSIS — C44619 Basal cell carcinoma of skin of left upper limb, including shoulder: Secondary | ICD-10-CM | POA: Diagnosis not present

## 2021-05-23 DIAGNOSIS — D225 Melanocytic nevi of trunk: Secondary | ICD-10-CM | POA: Diagnosis not present

## 2021-05-23 DIAGNOSIS — L578 Other skin changes due to chronic exposure to nonionizing radiation: Secondary | ICD-10-CM | POA: Diagnosis not present

## 2021-06-16 DIAGNOSIS — L570 Actinic keratosis: Secondary | ICD-10-CM | POA: Diagnosis not present

## 2021-06-16 DIAGNOSIS — C44619 Basal cell carcinoma of skin of left upper limb, including shoulder: Secondary | ICD-10-CM | POA: Diagnosis not present

## 2021-06-23 DIAGNOSIS — C44319 Basal cell carcinoma of skin of other parts of face: Secondary | ICD-10-CM | POA: Diagnosis not present

## 2021-07-13 ENCOUNTER — Ambulatory Visit: Payer: BC Managed Care – PPO | Admitting: Medical

## 2021-07-13 DIAGNOSIS — K122 Cellulitis and abscess of mouth: Secondary | ICD-10-CM | POA: Diagnosis not present

## 2021-07-13 DIAGNOSIS — Z20822 Contact with and (suspected) exposure to covid-19: Secondary | ICD-10-CM | POA: Diagnosis not present

## 2021-07-13 DIAGNOSIS — R030 Elevated blood-pressure reading, without diagnosis of hypertension: Secondary | ICD-10-CM | POA: Diagnosis not present

## 2021-07-13 DIAGNOSIS — J029 Acute pharyngitis, unspecified: Secondary | ICD-10-CM | POA: Diagnosis not present

## 2021-07-21 DIAGNOSIS — C4491 Basal cell carcinoma of skin, unspecified: Secondary | ICD-10-CM | POA: Diagnosis not present

## 2021-07-21 DIAGNOSIS — Z08 Encounter for follow-up examination after completed treatment for malignant neoplasm: Secondary | ICD-10-CM | POA: Diagnosis not present

## 2021-07-21 DIAGNOSIS — C4492 Squamous cell carcinoma of skin, unspecified: Secondary | ICD-10-CM | POA: Diagnosis not present

## 2021-07-21 DIAGNOSIS — L905 Scar conditions and fibrosis of skin: Secondary | ICD-10-CM | POA: Diagnosis not present

## 2021-08-03 ENCOUNTER — Other Ambulatory Visit: Payer: Self-pay | Admitting: Family Medicine

## 2021-08-03 DIAGNOSIS — I10 Essential (primary) hypertension: Secondary | ICD-10-CM

## 2021-10-18 ENCOUNTER — Telehealth: Payer: Self-pay | Admitting: Medical

## 2021-10-18 DIAGNOSIS — I10 Essential (primary) hypertension: Secondary | ICD-10-CM

## 2021-10-18 MED ORDER — ENALAPRIL MALEATE 20 MG PO TABS: 40.0000 mg | ORAL_TABLET | Freq: Every day | ORAL | 0 refills | Status: DC

## 2021-10-18 NOTE — Telephone Encounter (Signed)
Rx sent 

## 2021-10-18 NOTE — Telephone Encounter (Signed)
Medication:  enalapril (VASOTEC) 20 MG tablet [[id number]]     Has the patient contacted their pharmacy? Yes If no, request that the patient contact the pharmacy for the refill.) (If yes, when and what did the pharmacy advise?) Pt was advised to contact ov for med refill.     Preferred Pharmacy (with phone number or street name):  [id number] [address], [place] [address]  [address], [place] [address]  Phone:  [phone]  Fax:  [phone]     Agent: Please be advised that RX refills may take up to 3 business days. We ask that you follow-up with your pharmacy.

## 2021-11-30 ENCOUNTER — Telehealth (INDEPENDENT_AMBULATORY_CARE_PROVIDER_SITE_OTHER): Payer: 59 | Admitting: Medical

## 2021-11-30 DIAGNOSIS — J0120 Acute ethmoidal sinusitis, unspecified: Secondary | ICD-10-CM

## 2021-11-30 DIAGNOSIS — J3489 Other specified disorders of nose and nasal sinuses: Secondary | ICD-10-CM | POA: Diagnosis not present

## 2021-11-30 DIAGNOSIS — J301 Allergic rhinitis due to pollen: Secondary | ICD-10-CM | POA: Diagnosis not present

## 2021-11-30 MED ORDER — FLUTICASONE PROPIONATE 50 MCG/ACT NA SUSP: 2.0000 | Freq: Every day | NASAL | 1 refills | Status: DC

## 2021-11-30 MED ORDER — AZITHROMYCIN 250 MG PO TABS: ORAL_TABLET | ORAL | 0 refills | Status: AC

## 2021-11-30 NOTE — Progress Notes (Signed)
? ?  Subjective:  ? ? Patient ID: Gilbert [name], male    DOB: November 11, 1970, 51 y.o.   MRN: [id number] ? ?HPI ?Virtual Visit via Video Note ? ?I connected with Gilbert [name] on 11/30/21 at  3:20 PM EST by a video enabled telemedicine application and verified that I am speaking with the correct person using two identifiers. ? ?Location: ?Patient: work ?Provider: office ?  ?I discussed the limitations of evaluation and management by telemedicine and the availability of in person appointments. The patient expressed understanding and agreed to proceed. ? ?History of Present Illness: ? ? 2 weeks of low level nasal congestion and points to ethmoid sinus area with pain. Occasional sneeze and itching eyes. ? ?Pt went to [place] 3 weeks ago and when he got back is when got congestion. Some spring allergies in [place]. ? ?Started allegra one week ago.  ? ?Occasional productive cough recently. Feels some pnd. ? ? ?Observations/Objective: ? ?General-no acute distress, pleasant, oriented. ?Lungs- on inspection lungs appear unlabored. ?Neck- no tracheal deviation or jvd on inspection. ?Neuro- gross motor function appears intact.  ? ?Assessment and Plan: ? ?Patient Instructions  ?2 weeks of low-level nasal congestion with occasional sneezing, itching eyes, postnasal drainage and recent ethmoid sinus region pressure/pain.  Probable allergies with potential for ethmoid sinus infection as well. ? ?Continue Allegra and adding Flonase nasal spray.  Went ahead and sent in azithromycin antibiotic. ? ?If signs or symptoms persist despite above measures consider possible taper prednisone or Depo-Medrol injection. ? ?Follow-up in 1 week for once exam and will listen to lungs and evaluate HEENT  ? ? ?Follow Up Instructions: ? ?  ?I discussed the assessment and treatment plan with the patient. The patient was provided an opportunity to ask questions and all were answered. The patient agreed with the plan and demonstrated an understanding of the  instructions. ?  ?The patient was advised to call back or seek an in-person evaluation if the symptoms worsen or if the condition fails to improve as anticipated. ? ?Time spent with patient today was 15  minutes which consisted of chart revdiew, discussing diagnosis, work up treatment and documentation.  ? ? ?Mackie Pai, PA-C  ? ? ?Review of Systems ? ?   ?Objective:  ? Physical Exam ? ? ? ? ?   ?Assessment & Plan:  ? ? ?

## 2021-11-30 NOTE — Patient Instructions (Signed)
2 weeks of low-level nasal congestion with occasional sneezing, itching eyes, postnasal drainage and recent ethmoid sinus region pressure/pain.  Probable allergies with potential for ethmoid sinus infection as well. ? ?Continue Allegra and adding Flonase nasal spray.  Went ahead and sent in azithromycin antibiotic. ? ?If signs or symptoms persist despite above measures consider possible taper prednisone or Depo-Medrol injection. ? ?Follow-up in 1 week for once exam and will listen to lungs and evaluate HEENT ?

## 2021-12-07 ENCOUNTER — Telehealth: Payer: Self-pay

## 2021-12-07 ENCOUNTER — Encounter: Payer: Self-pay | Admitting: Medical

## 2021-12-07 ENCOUNTER — Ambulatory Visit (INDEPENDENT_AMBULATORY_CARE_PROVIDER_SITE_OTHER): Payer: 59 | Admitting: Medical

## 2021-12-07 VITALS — BP 132/80 | HR 68 | Resp 18 | Ht 75.0 in | Wt 235.0 lb

## 2021-12-07 DIAGNOSIS — J0100 Acute maxillary sinusitis, unspecified: Secondary | ICD-10-CM | POA: Diagnosis not present

## 2021-12-07 DIAGNOSIS — K649 Unspecified hemorrhoids: Secondary | ICD-10-CM

## 2021-12-07 DIAGNOSIS — R739 Hyperglycemia, unspecified: Secondary | ICD-10-CM | POA: Diagnosis not present

## 2021-12-07 DIAGNOSIS — R030 Elevated blood-pressure reading, without diagnosis of hypertension: Secondary | ICD-10-CM

## 2021-12-07 DIAGNOSIS — Z0000 Encounter for general adult medical examination without abnormal findings: Secondary | ICD-10-CM | POA: Diagnosis not present

## 2021-12-07 DIAGNOSIS — Z23 Encounter for immunization: Secondary | ICD-10-CM | POA: Diagnosis not present

## 2021-12-07 DIAGNOSIS — J3489 Other specified disorders of nose and nasal sinuses: Secondary | ICD-10-CM

## 2021-12-07 DIAGNOSIS — Z125 Encounter for screening for malignant neoplasm of prostate: Secondary | ICD-10-CM | POA: Diagnosis not present

## 2021-12-07 MED ORDER — HYDROCORTISONE ACETATE 25 MG RE SUPP: 25.0000 mg | Freq: Two times a day (BID) | RECTAL | 0 refills | Status: DC

## 2021-12-07 MED ORDER — METHYLPREDNISOLONE 4 MG PO TABS: ORAL_TABLET | ORAL | 0 refills | Status: DC

## 2021-12-07 NOTE — Addendum Note (Signed)
Addended by: Jeronimo Greaves on: 12/07/2021 02:42 PM ? ? Modules accepted: Orders ? ?

## 2021-12-07 NOTE — Progress Notes (Signed)
? ?Subjective:  ? ? Patient ID: Gilbert [name], male    DOB: 1971-07-02, 51 y.o.   MRN: [id number] ? ?HPI ?Pt in for cpe and follow up from last visit. Had hoped he would be better from last video visit. ? ?Pt is fasting. Pt works at [employer] in [place], not exercising regularly. Moderate healthy diet but about 1/2 time unhealthy. Non smoker. 2 beers a day. Coffee in am and unsweet tea. No sodas. ?  ?Pt has had colonoscopy.   ? ?Will get shingles vaccine today. ? ?Pt states last week he had checked bp and was 150/90.  ? ?Pt was on allegra for 2 weeks. He got over the counter. He is not sure if D formulation. He also used his friends portable electronic bp monitor.  ? ?Today his bp was lower.  ? ?Pt has been using ibuprofen/sudafed combination for sinus pressure. Pt also took azithromycin did take some of pressure away.  ? ?Last time took sudafed ibuprofen combo was yesterday. He states sinus pressure will resolve. ? ?He notes some hx of itching and rectal pain. He has uses some suppository for hemorrhoids in past and he states has some pain over past 2 months intermittent. ? ? ?Review of Systems  ?Constitutional:  Negative for chills, fatigue and fever.  ?HENT:  Positive for congestion and sinus pressure. Negative for ear pain and facial swelling.   ?Respiratory:  Negative for cough, chest tightness, shortness of breath and wheezing.   ?Cardiovascular:  Negative for chest pain and palpitations.  ?Gastrointestinal:  Negative for abdominal pain, constipation, nausea and vomiting.  ?     Rectal itch. Hx of hemorrhoid.  ?Genitourinary:  Negative for dysuria, flank pain, frequency, penile pain, testicular pain and urgency.  ?Musculoskeletal:  Negative for back pain, myalgias and neck stiffness.  ?Skin:  Negative for rash.  ? ? ?Past Medical History:  ?Diagnosis Date  ? Chronic back pain   ? GERD (gastroesophageal reflux disease)   ? Hepatitis   ? unknown type many yrs ago - no residual problems  ? Hypertension   ?  Insomnia   ? Morbid obesity ([place])   ? Ocular rosacea   ? ?  ?Social History  ? ?Socioeconomic History  ? Marital status: Married  ?  Spouse name: Not on file  ? Number of children: Not on file  ? Years of education: Not on file  ? Highest education level: Not on file  ?Occupational History  ? Not on file  ?Tobacco Use  ? Smoking status: Never  ? Smokeless tobacco: Never  ?Vaping Use  ? Vaping Use: Never used  ?Substance and Sexual Activity  ? Alcohol use: Yes  ?  Alcohol/week: 5.0 standard drinks  ?  Types: 5 Shots of liquor per week  ?  Comment: social  ? Drug use: No  ? Sexual activity: Not on file  ?Other Topics Concern  ? Not on file  ?Social History Narrative  ? Not on file  ? ?Social Determinants of Health  ? ?Financial Resource Strain: Not on file  ?Food Insecurity: Not on file  ?Transportation Needs: Not on file  ?Physical Activity: Not on file  ?Stress: Not on file  ?Social Connections: Not on file  ?Intimate Partner Violence: Not on file  ? ? ?Past Surgical History:  ?Procedure Laterality Date  ? ANKLE ARTHROSCOPY WITH RECONSTRUCTION Right 09/26/2018  ? Procedure: ANKLE DEBRIDEMENT WITH POSSIBLE LATERAL LIGAMENT  RECONSTRUCTION;  Surgeon: Erle Crocker, MD;  Location:  [facility];  Service: Orthopedics;  Laterality: Right;  ? HIATAL HERNIA REPAIR  04/23/2012  ? Procedure: HERNIA REPAIR HIATAL;  Surgeon: Pedro Earls, MD;  Location: [facility];  Service: General;  Laterality: N/A;  ? KNEE SURGERY Right   ? fracture knee cap  ? LAPAROSCOPIC APPENDECTOMY N/A 07/31/2018  ? Procedure: APPENDECTOMY LAPAROSCOPIC;  Surgeon: Jovita Kussmaul, MD;  Location: [facility];  Service: General;  Laterality: N/A;  ? LAPAROSCOPIC GASTRIC BANDING  04/23/2012  ? Procedure: LAPAROSCOPIC GASTRIC BANDING;  Surgeon: Pedro Earls, MD;  Location: [facility];  Service: General;  Laterality: N/A;  ? LASIK    ? ? ?Family History  ?Problem Relation Age of Onset  ? Hypertension Father   ? Diabetes Father   ? Heart disease Father   ?     pacemaker   ? Colon cancer Neg Hx   ? Colon polyps Neg Hx   ? Esophageal cancer Neg Hx   ? Rectal cancer Neg Hx   ? Stomach cancer Neg Hx   ? ? ?Allergies  ?Allergen Reactions  ? Penicillins Rash  ?  Has patient had a PCN reaction causing immediate rash, facial/tongue/throat swelling, SOB or lightheadedness with hypotension: Unknown ?Has patient had a PCN reaction causing severe rash involving mucus membranes or skin necrosis: Unknown ?Has patient had a PCN reaction that required hospitalization: Unknown ?Has patient had a PCN reaction occurring within the last 10 years: No ?If all of the above answers are "NO", then may proceed with Cephalosporin use. ?  ? ? ?Current Outpatient Medications on File Prior to Visit  ?Medication Sig Dispense Refill  ? enalapril (VASOTEC) 20 MG tablet Take 2 tablets (40 mg total) by mouth daily. 180 tablet 0  ? fluticasone (FLONASE) 50 MCG/ACT nasal spray Place 2 sprays into both nostrils daily. 16 g 1  ? ?No current facility-administered medications on file prior to visit.  ? ? ?BP 115/87   Pulse 68   Resp 18   Ht '6\' 3"'$  (1.905 m)   Wt 235 lb (106.6 kg)   SpO2 95%   BMI 29.37 kg/m?  ?  ?   ?Objective:  ? Physical Exam ? ?General ?Mental Status- Alert. General Appearance- Not in acute distress.  ? ?Skin ?General: Color- Normal Color. Moisture- Normal Moisture. ? ?Neck ?Carotid Arteries- Normal color. Moisture- Normal Moisture. No carotid bruits. No JVD. ? ?Chest and Lung Exam ?Auscultation: ?Breath Sounds:-Normal. ? ?Cardiovascular ?Auscultation:Rythm- Regular. ?Murmurs & Other Heart Sounds:Auscultation of the heart reveals- No Murmurs. ? ?Abdomen ?Inspection:-Inspeection Normal. ?Palpation/Percussion:Note:No mass. Palpation and Percussion of the abdomen reveal- Non Tender, Non Distended + BS, no rebound or guarding. ? ? ?Neurologic ?Cranial Nerve exam:- CN III-XII intact(No nystagmus), symmetric smile. ?Strength:- 5/5 equal and symmetric strength both upper and lower extremities.  ? ?Heent-  ethmoid sinus pressure to palpatin. Rt side worse. ? ?Rectum- on inspection. Looks like mild early hemorhoid. Not thrombosed. ? ? ?   ?Assessment & Plan:  ? ?Patient Instructions  ?For you wellness exam today I have ordered cbc, cmp and lipid panel. ? ?Screening psa added to labs. ? ?Colonoscopy up to date. ? ?Shingrix vaccine. ? ?Recommend exercise and healthy diet. ? ?We will let you know lab results as they come in. ? ?Follow up date appointment will be determined after lab review.   ? ?Persisting sinus pressure/probable sinus infection persisting. Add doxycycline and taper medrol. If pain persists may need to refer to ent or get ct imaging. ? ?Bp  good today. Stop ibuprofen/nsaids and all sudafed. Check bp at home with at home machine. If bp over 140/90 let us know. ? ?For hemorrhoids- rx annusol hc suppository. ? ? ? ? ? ? ? ? Mackie Pai, PA-C  ? ?[phone] charge in addition to wellness. Addressed persisting sinus pressure, elevated bp and hemorrhoids. ?

## 2021-12-07 NOTE — Patient Instructions (Addendum)
For you wellness exam today I have ordered cbc, cmp and lipid panel. ? ?Screening psa added to labs. ? ?Colonoscopy up to date. ? ?Shingrix vaccine. ? ?Recommend exercise and healthy diet. ? ?We will let you know lab results as they come in. ? ?Follow up date appointment will be determined after lab review.   ? ?Persisting sinus pressure/probable sinus infection persisting. Add doxycycline and taper medrol. If pain persists may need to refer to ent or get ct imaging. ? ?Bp good today. Stop ibuprofen/nsaids and all sudafed. Check bp at home with at home machine. If bp over 140/90 let us know. ? ?For hemorrhoids- rx annusol hc suppository. ? ?Preventive Care 68-38 Years Old, Male ?Preventive care refers to lifestyle choices and visits with your health care provider that can promote health and wellness. Preventive care visits are also called wellness exams. ?What can I expect for my preventive care visit? ?Counseling ?During your preventive care visit, your health care provider may ask about your: ?Medical history, including: ?Past medical problems. ?Family medical history. ?Current health, including: ?Emotional well-being. ?Home life and relationship well-being. ?Sexual activity. ?Lifestyle, including: ?Alcohol, nicotine or tobacco, and drug use. ?Access to firearms. ?Diet, exercise, and sleep habits. ?Safety issues such as seatbelt and bike helmet use. ?Sunscreen use. ?Work and work Statistician. ?Physical exam ?Your health care provider will check your: ?Height and weight. These may be used to calculate your BMI (body mass index). BMI is a measurement that tells if you are at a healthy weight. ?Waist circumference. This measures the distance around your waistline. This measurement also tells if you are at a healthy weight and may help predict your risk of certain diseases, such as type 2 diabetes and high blood pressure. ?Heart rate and blood pressure. ?Body temperature. ?Skin for abnormal spots. ?What immunizations  do I need? ?Vaccines are usually given at various ages, according to a schedule. Your health care provider will recommend vaccines for you based on your age, medical history, and lifestyle or other factors, such as travel or where you work. ?What tests do I need? ?Screening ?Your health care provider may recommend screening tests for certain conditions. This may include: ?Lipid and cholesterol levels. ?Diabetes screening. This is done by checking your blood sugar (glucose) after you have not eaten for a while (fasting). ?Hepatitis B test. ?Hepatitis C test. ?HIV (human immunodeficiency virus) test. ?STI (sexually transmitted infection) testing, if you are at risk. ?Lung cancer screening. ?Prostate cancer screening. ?Colorectal cancer screening. ?Talk with your health care provider about your test results, treatment options, and if necessary, the need for more tests. ?Follow these instructions at home: ?Eating and drinking ? ?Eat a diet that includes fresh fruits and vegetables, whole grains, lean protein, and low-fat dairy products. ?Take vitamin and mineral supplements as recommended by your health care provider. ?Do not drink alcohol if your health care provider tells you not to drink. ?If you drink alcohol: ?Limit how much you have to 0-2 drinks a day. ?Know how much alcohol is in your drink. In the [place] one drink equals one 12 oz bottle of beer (355 mL), one 5 oz glass of wine (148 mL), or one 1? oz glass of hard liquor (44 mL). ?Lifestyle ?Brush your teeth every morning and night with fluoride toothpaste. Floss one time each day. ?Exercise for at least 30 minutes 5 or more days each week. ?Do not use any products that contain nicotine or tobacco. These products include cigarettes, chewing tobacco, and vaping devices,  such as e-cigarettes. If you need help quitting, ask your health care provider. ?Do not use drugs. ?If you are sexually active, practice safe sex. Use a condom or other form of protection to  prevent STIs. ?Take aspirin only as told by your health care provider. Make sure that you understand how much to take and what form to take. Work with your health care provider to find out whether it is safe and beneficial for you to take aspirin daily. ?Find healthy ways to manage stress, such as: ?Meditation, yoga, or listening to music. ?Journaling. ?Talking to a trusted person. ?Spending time with friends and family. ?Minimize exposure to UV radiation to reduce your risk of skin cancer. ?Safety ?Always wear your seat belt while driving or riding in a vehicle. ?Do not drive: ?If you have been drinking alcohol. Do not ride with someone who has been drinking. ?When you are tired or distracted. ?While texting. ?If you have been using any mind-altering substances or drugs. ?Wear a helmet and other protective equipment during sports activities. ?If you have firearms in your house, make sure you follow all gun safety procedures. ?What's next? ?Go to your health care provider once a year for an annual wellness visit. ?Ask your health care provider how often you should have your eyes and teeth checked. ?Stay up to date on all vaccines. ?This information is not intended to replace advice given to you by your health care provider. Make sure you discuss any questions you have with your health care provider. ?Document Revised: 03/09/2021 Document Reviewed: 03/09/2021 ?Elsevier Patient Education ? 2022 [place]. ? ? ? ? ? ? ? ? ? ?

## 2021-12-07 NOTE — Telephone Encounter (Signed)
Nurse Assessment ?Nurse: Lissa Merlin, RN, Abigail Date/Time (Eastern Time): 12/06/2021 10:30:50 PM ?Confirm and document reason for call. If ?symptomatic, describe symptoms. ?---Caller states that he has a physical tomorrow and ?is curious what he can and can not eat prior. Was told ?they were going to do blood work. Was not told that he ?was supposed to fast. ?Does the patient have any new or worsening ?symptoms? ---No ?Please document clinical information provided and ?list any resource used. ?---If your health care provider has told you to fast ?before a blood test, it means you should not eat ?or drink anything, except water, for several hours ?before your test. When you eat and drink normally, ?those foods and beverages are absorbed into your ?bloodstream. That could affect the results of certain ?types of blood tests. But you can drink water. It's ?actually good to drink water before a blood test. It ?helps keep more fluid in your veins, which can make it ?easier to draw blood. [employer] ?fasting-for-a-blood-test/ ?Disp. Time (Eastern ?Time) Disposition Final User ?12/06/2021 7:54:03 PM Send To Nurse Graylon Gunning, RN, [place] ?12/06/2021 8:32:57 PM Attempt made - message left Louretta Shorten, RN, [place] ?12/06/2021 8:44:13 PM FINAL ATTEMPT MADE - message ?left ?Louretta Shorten, [place], [place] ?12/06/2021 8:44:23 PM Send to RN Final Attempt Jacqulyn Ducking, RN, [place] ?12/06/2021 10:35:01 PM Clinical Call Yes Lissa Merlin, RN, [place] ? ?

## 2021-12-08 ENCOUNTER — Other Ambulatory Visit (HOSPITAL_BASED_OUTPATIENT_CLINIC_OR_DEPARTMENT_OTHER): Payer: Self-pay

## 2021-12-08 ENCOUNTER — Telehealth: Payer: Self-pay | Admitting: Medical

## 2021-12-08 LAB — COMPREHENSIVE METABOLIC PANEL
ALT: 18 U/L (ref 0–53)
AST: 31 U/L (ref 0–37)
Albumin: 4.7 g/dL (ref 3.5–5.2)
Alkaline Phosphatase: 52 U/L (ref 39–117)
BUN: 7 mg/dL (ref 6–23)
CO2: 31 mEq/L (ref 19–32)
Calcium: 9.7 mg/dL (ref 8.4–10.5)
Chloride: 99 mEq/L (ref 96–112)
Creatinine, Ser: 0.71 mg/dL (ref 0.40–1.50)
GFR: 106.29 mL/min (ref >60.00)
Glucose, Bld: 80 mg/dL (ref 70–99)
Potassium: 4.4 mEq/L (ref 3.5–5.1)
Sodium: 139 mEq/L (ref 135–145)
Total Bilirubin: 0.9 mg/dL (ref 0.2–1.2)
Total Protein: 7 g/dL (ref 6.0–8.3)

## 2021-12-08 LAB — HEMOGLOBIN A1C: Hgb A1c MFr Bld: 4.9 % (ref 4.6–6.5)

## 2021-12-08 LAB — CBC WITH DIFFERENTIAL/PLATELET
Basophils Absolute: 0.1 10*3/uL (ref 0.0–0.1)
Basophils Relative: 0.8 % (ref 0.0–3.0)
Eosinophils Absolute: 0.1 10*3/uL (ref 0.0–0.7)
Eosinophils Relative: 0.8 % (ref 0.0–5.0)
HCT: 41.1 % (ref 39.0–52.0)
Hemoglobin: 13.9 g/dL (ref 13.0–17.0)
Lymphocytes Relative: 19.7 % (ref 12.0–46.0)
Lymphs Abs: 1.3 10*3/uL (ref 0.7–4.0)
MCHC: 33.8 g/dL (ref 30.0–36.0)
MCV: 103.1 fl — ABNORMAL HIGH (ref 78.0–100.0)
Monocytes Absolute: 0.5 10*3/uL (ref 0.1–1.0)
Monocytes Relative: 7.5 % (ref 3.0–12.0)
Neutro Abs: 4.5 10*3/uL (ref 1.4–7.7)
Neutrophils Relative %: 71.2 % (ref 43.0–77.0)
Platelets: 252 10*3/uL (ref 150.0–400.0)
RBC: 3.98 Mil/uL — ABNORMAL LOW (ref 4.22–5.81)
RDW: 12.5 % (ref 11.5–15.5)
WBC: 6.4 10*3/uL (ref 4.0–10.5)

## 2021-12-08 LAB — PSA: PSA: 0.34 ng/mL (ref 0.10–4.00)

## 2021-12-08 LAB — LIPID PANEL
Cholesterol: 228 mg/dL — ABNORMAL HIGH (ref 0–200)
HDL: 143.9 mg/dL (ref >39.00)
LDL Cholesterol: 75 mg/dL (ref 0–99)
NonHDL: 84.42
Total CHOL/HDL Ratio: 2
Triglycerides: 45 mg/dL (ref 0.0–149.0)
VLDL: 9 mg/dL (ref 0.0–40.0)

## 2021-12-08 MED ORDER — DOXYCYCLINE HYCLATE 100 MG PO TABS: 100.0000 mg | ORAL_TABLET | Freq: Two times a day (BID) | ORAL | 0 refills | Status: DC

## 2021-12-08 MED ORDER — HYDROCORTISONE ACETATE 25 MG RE SUPP
25.0000 mg | Freq: Two times a day (BID) | RECTAL | 0 refills | Status: DC
  Filled 2021-12-08 (×2): qty 12, 6d supply, fill #0

## 2021-12-08 NOTE — Telephone Encounter (Signed)
Pt notified and also made him aware of new script sent downstairs ?

## 2021-12-08 NOTE — Telephone Encounter (Signed)
Chart opened to rx med, order lab, review chart, respond to my chart message or send message to staff member  

## 2021-12-08 NOTE — Telephone Encounter (Signed)
Patient states his Doxycline medication was not send to the pharmacy, and he would also like to know if there is another recommendation for suppositories. He states it costs him $50 for a box of 12. He would like to know if there is something cheaper. Please advise.  ?

## 2021-12-08 NOTE — Telephone Encounter (Signed)
Rx doxycycline sent to pt pharmacy. 

## 2021-12-12 ENCOUNTER — Telehealth (INDEPENDENT_AMBULATORY_CARE_PROVIDER_SITE_OTHER): Payer: 59 | Admitting: Medical

## 2021-12-12 ENCOUNTER — Telehealth: Payer: Self-pay

## 2021-12-12 ENCOUNTER — Other Ambulatory Visit (HOSPITAL_BASED_OUTPATIENT_CLINIC_OR_DEPARTMENT_OTHER): Payer: Self-pay

## 2021-12-12 DIAGNOSIS — J0100 Acute maxillary sinusitis, unspecified: Secondary | ICD-10-CM | POA: Diagnosis not present

## 2021-12-12 MED ORDER — CLINDAMYCIN HCL 150 MG PO CAPS
150.0000 mg | ORAL_CAPSULE | Freq: Three times a day (TID) | ORAL | 0 refills | Status: DC
  Filled 2021-12-12 (×2): qty 30, 10d supply, fill #0

## 2021-12-12 MED ORDER — CLINDAMYCIN HCL 150 MG PO CAPS: 150.0000 mg | ORAL_CAPSULE | Freq: Three times a day (TID) | ORAL | 0 refills | Status: DC

## 2021-12-12 NOTE — Progress Notes (Signed)
? ?  Subjective:  ? ? Patient ID: Gilbert [name], male    DOB: 08/21/1971, 51 y.o.   MRN: [id number] ? ?HPI ? ?Virtual Visit via Video Note ? ?I connected with Gilbert [name] on 12/12/21 at  3:20 PM EDT by a video enabled telemedicine application and verified that I am speaking with the correct person using two identifiers. ? ?Location: ?Patient: home ?Provider: office ?  ?I discussed the limitations of evaluation and management by telemedicine and the availability of in person appointments. The patient expressed understanding and agreed to proceed. ? ?History of Present Illness: ?Patient reports the last 24 hours he got pinkish-red rash in the region where he got Shingrix vaccine last week.  He reports no signs or symptoms immediately after the injection so he expresses some surprise as to why the area became red.  There is no blistering eruption and no itching to the area.  He does state that the area feels warm and slightly tender to palpation.  He shows me arm by video. ? ?Patient sinuses are still tender.  This is despite starting doxycycline. ?  ?Observations/Objective: ?General-no acute distress, pleasant, oriented. ?Lungs- on inspection lungs appear unlabored. ?Neck- no tracheal deviation or jvd on inspection. ?Neuro- gross motor function appears intact.  ?Left arm-on video lateral portion of the arm shows a pinkish-red area that he describes as about 6 inches by approximately 3 to 4 inches.  Mildly warm to touch.  Minimal tenderness.  No induration described.  On inspection area looks uniform. ? ?Assessment and Plan: ?Patient Instructions  ?By video and history appears probable skin infection developing left upper extremity(I don't think allergic reaction).  Stop doxycycline and start clindamycin antibiotic.  Rx advisement given regarding eating probiotic rich foods and getting probiotics over-the-counter as well.  Watch area daily and would expect margins of distress.  Expect area to decrease in size.  If it  is area is not improving then need to evaluate area in the office/in person. ? ?Persisting sinus pressure.  Clindamycin has good coverage for the sinuses as well. ? ?Follow-up date to be determined after update on Thursday regarding skin infection.  ? ?Mackie Pai, PA-C  ? ?Follow Up Instructions: ? ?  ?I discussed the assessment and treatment plan with the patient. The patient was provided an opportunity to ask questions and all were answered. The patient agreed with the plan and demonstrated an understanding of the instructions. ?  ?The patient was advised to call back or seek an in-person evaluation if the symptoms worsen or if the condition fails to improve as anticipated. ? ?Time spent with patient today was 22  minutes which consisted of chart review, discussing diagnosis, work up treatment and documentation.  ? ? ?Mackie Pai, PA-C  ? ? ?Review of Systems ? ?   ?Objective:  ? Physical Exam ? ? ? ? ?   ?Assessment & Plan:  ? ? ?

## 2021-12-12 NOTE — Telephone Encounter (Signed)
Pt called in states that he received the shingles vaccine on 12/07/21 and his arm is swolllen, warm to touch and red. Pt would like advice ?

## 2021-12-12 NOTE — Telephone Encounter (Signed)
Pt called and mychart scheduled  ?

## 2021-12-12 NOTE — Patient Instructions (Signed)
By video and history appears probable skin infection developing left upper extremity(I don't think allergic reaction).  Stop doxycycline and start clindamycin antibiotic.  Rx advisement given regarding eating probiotic rich foods and getting probiotics over-the-counter as well.  Watch area daily and would expect margins of distress.  Expect area to decrease in size.  If it is area is not improving then need to evaluate area in the office/in person. ? ?Persisting sinus pressure.  Clindamycin has good coverage for the sinuses as well. ? ?Follow-up date to be determined after update on Thursday regarding skin infection. ?

## 2021-12-13 ENCOUNTER — Telehealth: Payer: Self-pay

## 2021-12-13 NOTE — Telephone Encounter (Signed)
Nurse Assessment ?Nurse: Prudence Davidson, RN, Clarise Cruz Date/Time Eilene Ghazi Time): 12/12/2021 5:18:23 PM ?Confirm and document reason for call. If ?symptomatic, describe symptoms. ?---Caller had a tele visit today. Rash on his arm has ?gotten worse. Rash has spread. Was prescribed an abx, ?did have a sinus infection now for 4 weeks. Given ?zpak then doxycycline. Rash began this morning. Did ?get the shingles vaccine on Wednesday. Rash on left ?arm, which is the arm vaccine was given. Rash is in ?between pink and bright red. ?Does the patient have any new or worsening ?symptoms? ---Yes ?Will a triage be completed? ---Yes ?Related visit to physician within the last 2 weeks? ---Yes ?Does the PT have any chronic conditions? (i.e. ?diabetes, asthma, this includes High risk factors for ?pregnancy, etc.) ?---Yes ?List chronic conditions. ---HTN ?Is this a behavioral health or substance abuse call? ---No ?Guidelines ?Guideline Title Affirmed Question Affirmed Notes Nurse Date/Time (Eastern ?Time) ?Rash or Redness - ?Localized ?[1] Looks infected ?(spreading redness, ?pus) AND [2] large ?red area (> 2 in. or 5 ?cm) ?Prudence Davidson, RN, Clarise Cruz 12/12/2021 5:21:46 ?PM ?PLEASE NOTE: All timestamps contained within this report are represented as [place] Standard Time. ?CONFIDENTIALTY NOTICE: This fax transmission is intended only for the addressee. It contains information that is legally privileged, confidential or ?otherwise protected from use or disclosure. If you are not the intended recipient, you are strictly prohibited from reviewing, disclosing, copying using ?or disseminating any of this information or taking any action in reliance on or regarding this information. If you have received this fax in error, please ?notify us immediately by telephone so that we can arrange for its return to [place]. Phone: [phone], Toll-Free: [phone], Fax: [phone] ?Page: 2 of 2 ?Call Id: [id number] ?Disp. Time (Eastern ?Time) Disposition Final User ?12/12/2021  5:26:31 PM See HCP within 4 Hours (or ?PCP triage) ?Yes Prudence Davidson, RN, Clarise Cruz ?Caller Disagree/Comply Comply ?Caller Understands Yes ?PreDisposition Did not know what to do ?Care Advice Given Per Guideline ?SEE HCP (OR PCP TRIAGE) WITHIN 4 HOURS: * IF OFFICE WILL BE OPEN: You need to be seen within the next 3 or 4 ?hours. Call your doctor (or NP/PA) now or as soon as the office opens. CALL BACK IF: * You become worse CARE ADVICE ?given per Rash - Localized and Cause Unknown (Adult) guideline. ?Referrals ?GO TO FACILITY UNDECIDED ?

## 2021-12-22 ENCOUNTER — Other Ambulatory Visit: Payer: Self-pay | Admitting: Medical

## 2022-01-09 ENCOUNTER — Other Ambulatory Visit: Payer: Self-pay | Admitting: Medical

## 2022-01-16 ENCOUNTER — Other Ambulatory Visit: Payer: Self-pay | Admitting: Medical

## 2022-01-16 DIAGNOSIS — I10 Essential (primary) hypertension: Secondary | ICD-10-CM

## 2022-01-18 ENCOUNTER — Other Ambulatory Visit: Payer: Self-pay | Admitting: Student

## 2022-01-18 DIAGNOSIS — R1013 Epigastric pain: Secondary | ICD-10-CM

## 2022-01-19 ENCOUNTER — Ambulatory Visit
Admission: RE | Admit: 2022-01-19 | Discharge: 2022-01-19 | Disposition: A | Discharge status: Home / Self Care | Payer: 59 | Source: Ambulatory Visit | Attending: Student | Admitting: Student

## 2022-01-19 DIAGNOSIS — R1013 Epigastric pain: Secondary | ICD-10-CM

## 2022-02-09 ENCOUNTER — Other Ambulatory Visit (HOSPITAL_BASED_OUTPATIENT_CLINIC_OR_DEPARTMENT_OTHER): Payer: Self-pay

## 2022-02-09 ENCOUNTER — Other Ambulatory Visit: Payer: Self-pay | Admitting: Medical

## 2022-02-09 MED ORDER — HYDROCORTISONE ACETATE 25 MG RE SUPP
25.0000 mg | Freq: Two times a day (BID) | RECTAL | 0 refills | Status: DC
  Filled 2022-02-09: qty 12, 6d supply, fill #0

## 2022-03-13 ENCOUNTER — Other Ambulatory Visit: Payer: Self-pay

## 2022-03-13 ENCOUNTER — Telehealth: Payer: Self-pay | Admitting: *Deleted

## 2022-03-13 ENCOUNTER — Telehealth: Payer: Self-pay | Admitting: Medical

## 2022-03-13 ENCOUNTER — Emergency Department (HOSPITAL_BASED_OUTPATIENT_CLINIC_OR_DEPARTMENT_OTHER): Payer: 59

## 2022-03-13 ENCOUNTER — Emergency Department (HOSPITAL_BASED_OUTPATIENT_CLINIC_OR_DEPARTMENT_OTHER)
Admission: EM | Admit: 2022-03-13 | Discharge: 2022-03-13 | Disposition: A | Discharge status: Home / Self Care | Payer: 59 | Attending: Emergency Medicine | Admitting: Emergency Medicine

## 2022-03-13 ENCOUNTER — Encounter (HOSPITAL_BASED_OUTPATIENT_CLINIC_OR_DEPARTMENT_OTHER): Payer: Self-pay | Admitting: Urology

## 2022-03-13 DIAGNOSIS — I498 Other specified cardiac arrhythmias: Secondary | ICD-10-CM

## 2022-03-13 DIAGNOSIS — R55 Syncope and collapse: Secondary | ICD-10-CM | POA: Insufficient documentation

## 2022-03-13 DIAGNOSIS — I492 Junctional premature depolarization: Secondary | ICD-10-CM | POA: Insufficient documentation

## 2022-03-13 DIAGNOSIS — I10 Essential (primary) hypertension: Secondary | ICD-10-CM | POA: Insufficient documentation

## 2022-03-13 LAB — BASIC METABOLIC PANEL
Anion gap: 10 (ref 5–15)
BUN: 9 mg/dL (ref 6–20)
CO2: 28 mmol/L (ref 22–32)
Calcium: 9.5 mg/dL (ref 8.9–10.3)
Chloride: 102 mmol/L (ref 98–111)
Creatinine, Ser: 0.73 mg/dL (ref 0.61–1.24)
GFR, Estimated: >60 mL/min (ref >60)
Glucose, Bld: 95 mg/dL (ref 70–99)
Potassium: 3.8 mmol/L (ref 3.5–5.1)
Sodium: 140 mmol/L (ref 135–145)

## 2022-03-13 LAB — CBC WITH DIFFERENTIAL/PLATELET
Abs Immature Granulocytes: 0.02 10*3/uL (ref 0.00–0.07)
Basophils Absolute: 0.1 10*3/uL (ref 0.0–0.1)
Basophils Relative: 1 %
Eosinophils Absolute: 0 10*3/uL (ref 0.0–0.5)
Eosinophils Relative: 1 %
HCT: 40.7 % (ref 39.0–52.0)
Hemoglobin: 14.2 g/dL (ref 13.0–17.0)
Immature Granulocytes: 0 %
Lymphocytes Relative: 17 %
Lymphs Abs: 1 10*3/uL (ref 0.7–4.0)
MCH: 34.8 pg — ABNORMAL HIGH (ref 26.0–34.0)
MCHC: 34.9 g/dL (ref 30.0–36.0)
MCV: 99.8 fL (ref 80.0–100.0)
Monocytes Absolute: 0.5 10*3/uL (ref 0.1–1.0)
Monocytes Relative: 9 %
Neutro Abs: 4 10*3/uL (ref 1.7–7.7)
Neutrophils Relative %: 72 %
Platelets: 243 10*3/uL (ref 150–400)
RBC: 4.08 MIL/uL — ABNORMAL LOW (ref 4.22–5.81)
RDW: 12.1 % (ref 11.5–15.5)
WBC: 5.6 10*3/uL (ref 4.0–10.5)
nRBC: 0 % (ref 0.0–0.2)

## 2022-03-13 LAB — TROPONIN I (HIGH SENSITIVITY): Troponin I (High Sensitivity): 3 ng/L (ref <18)

## 2022-03-13 LAB — CBG MONITORING, ED: Glucose-Capillary: 90 mg/dL (ref 70–99)

## 2022-03-13 NOTE — Discharge Instructions (Addendum)
You were seen today for a near syncopal episode.  No definitive cause was noted today.  As you are asymptomatic at this time, and your vitals are stable, it is safe to discharge home at this time.  I have placed a referral to cardiology for further evaluation of the junctional rhythm that was noted at your visit today.  Return if you develop any life-threatening condition such as chest pain, shortness of breath, or altered level of consciousness

## 2022-03-13 NOTE — Telephone Encounter (Signed)
elationship To Patient Self Return Phone Number [phone] (Primary) Chief Complaint Dizziness Reason for Call Symptomatic / Request for Health Information Initial Comment Caller states he was standing and got dizzy and sweaty and almost fell down. Translation No Nurse Assessment Nurse: Clovis Riley, RN, Georgina Peer Date/Time (Eastern Time): 03/13/2022 11:12:59 AM Confirm and document reason for call. If symptomatic, describe symptoms. ---Caller states he was standing and got dizzy around 0900. States he got sweaty and almost fell down. no chest pain. BP 127/84

## 2022-03-13 NOTE — ED Provider Notes (Signed)
[facility] EMERGENCY DEPARTMENT Provider Note   CSN: [id number] Arrival date & time: 03/13/22  1609     History  Chief Complaint  Patient presents with   Near Syncope    Gilbert [name] is a 51 y.o. male.  Patient presents to the hospital complaining of a near syncopal episode.  Patient states that this morning while at work at approximately 9-9 30 he felt lightheaded, felt like the room may be spinning, and had to sit down.  Coworkers witnessed the event.  The patient's primary care provider was on vacation so the patient went to urgent care.  The urgent care provider felt that he needed a more thorough work-up then was possible in that setting and sent him to the emergency department.  The patient currently has no complaints.  He denies shortness of breath, chest pain, dizziness, headache, abdominal pain, nausea, vomiting.  He has had no other episodes since the initial 1 this morning.  He states that he had a similar episode 2 to 3 years ago when returning to work from [employer] but has had no repeat since that time.  Past medical history significant for hypertension, chronic back pain, GERD  HPI     Home Medications Prior to Admission medications   Medication Sig Start Date End Date Taking? Authorizing Provider  clindamycin (CLEOCIN) 150 MG capsule Take 1 capsule (150 mg total) by mouth 3 (three) times daily. 12/12/21   Saguier, Percell Miller, PA-C  enalapril (VASOTEC) 20 MG tablet TAKE 2 TABLETS (40 MG TOTAL) BY MOUTH DAILY. 01/16/22   Saguier, Percell Miller, PA-C  fluticasone [facility]) 50 MCG/ACT nasal spray SPRAY 2 SPRAYS INTO EACH NOSTRIL EVERY DAY 01/10/22   Saguier, Percell Miller, PA-C  hydrocortisone (ANUSOL-HC) 25 MG suppository Unwrap and insert 1 suppository (25 mg total) rectally 2 (two) times daily. 02/09/22   Saguier, Percell Miller, PA-C  methylPREDNISolone (MEDROL) 4 MG tablet 4 tab po day 1, 3 tab po day 2, 2 tab po day 3 and 1 tab po day 4. 12/07/21   Saguier, Percell Miller, PA-C      Allergies     Penicillins    Review of Systems   Review of Systems  Constitutional:  Negative for fever.  Respiratory:  Negative for shortness of breath.   Cardiovascular:  Negative for chest pain.  Gastrointestinal:  Negative for abdominal pain, nausea and vomiting.  Neurological:  Positive for syncope and light-headedness. Negative for speech difficulty, weakness and headaches.    Physical Exam Updated Vital Signs BP (!) 155/95   Pulse 68   Temp 98.7 F (37.1 C) (Oral)   Resp 16   Ht '6\' 3"'$  (1.905 m)   Wt 112.5 kg   SpO2 97%   BMI 31.00 kg/m  Physical Exam Vitals and nursing note reviewed.  Constitutional:      General: He is not in acute distress. HENT:     Head: Normocephalic and atraumatic.     Mouth/Throat:     Mouth: Mucous membranes are moist.  Cardiovascular:     Rate and Rhythm: Normal rate and regular rhythm.     Pulses: Normal pulses.     Heart sounds: Normal heart sounds.  Pulmonary:     Effort: Pulmonary effort is normal.     Breath sounds: Normal breath sounds.  Abdominal:     Palpations: Abdomen is soft.     Tenderness: There is no abdominal tenderness.  Musculoskeletal:        General: Normal range of motion.  Cervical back: Normal range of motion and neck supple.  Skin:    General: Skin is warm and dry.  Neurological:     Mental Status: He is alert.     Motor: Motor function is intact. No weakness or pronator drift.     Coordination: Coordination is intact. Coordination normal. Finger-Nose-Finger Test and Heel to [facility] Test normal.     Comments: Cranial nerves II through VII, XI, XII intact     ED Results / Procedures / Treatments   Labs (all labs ordered are listed, but only abnormal results are displayed) Labs Reviewed  CBC WITH DIFFERENTIAL/PLATELET - Abnormal; Notable for the following components:      Result Value   RBC 4.08 (*)    MCH 34.8 (*)    All other components within normal limits  BASIC METABOLIC PANEL  CBG MONITORING, ED  TROPONIN  I (HIGH SENSITIVITY)    EKG EKG Interpretation  Date/Time:  Monday March 13 2022 16:23:55 EDT Ventricular Rate:  65 PR Interval:  130 QRS Duration: 82 QT Interval:  420 QTC Calculation: 436 R Axis:   47 Text Interpretation: Junctional rhythm Abnormal ECG When compared with ECG of 26-Sep-2018 09:18, PREVIOUS ECG IS PRESENT Confirmed by Octaviano Glow [phone]) on 03/13/2022 4:29:43 PM  Radiology DG Chest 2 View  Result Date: 03/13/2022 CLINICAL DATA:  Syncope, dizziness since this morning, history hypertension EXAM: CHEST - 2 VIEW COMPARISON:  05/02/2012 FINDINGS: Normal heart size, mediastinal contours, and pulmonary vascularity. Lungs clear. No pulmonary infiltrate, pleural effusion, or pneumothorax. Osseous structures unremarkable. IMPRESSION: No acute abnormalities. Electronically Signed   By: Lavonia Dana M.D.   On: 03/13/2022 17:11    Procedures Procedures    Medications Ordered in ED Medications - No data to display  ED Course/ Medical Decision Making/ A&P                           Medical Decision Making Amount and/or Complexity of Data Reviewed Labs: ordered. Radiology: ordered. ECG/medicine tests: ordered.   This patient presents to the ED for concern of near syncope, this involves an extensive number of treatment options, and is a complaint that carries with it a high risk of complications and morbidity.  The differential diagnosis includes dysrhythmias, vasovagal syncope, volume depletion, CVA, and others   Co morbidities that complicate the patient evaluation  Hypertension   Additional history obtained:   External records from outside source obtained and reviewed including urgent care records from earlier today   Lab Tests:  I Ordered, and personally interpreted labs.  The pertinent results include: CBG of 90, normal BMP, RBC 4.08, MCH 34.8,   Imaging Studies ordered:  I ordered imaging studies including chest x-ray I independently visualized and  interpreted imaging which showed no acute abnormalities I agree with the radiologist interpretation   Cardiac Monitoring: / EKG:  The patient was maintained on a cardiac monitor.  I personally viewed and interpreted the cardiac monitored which showed an underlying rhythm of: Junctional rhythm  Test / Admission - Considered:  Neuro exam was normal.  No suspicion at this time for CVA/TIA. Patient does have intermittent junctional rhythm on EKG, unlikely cause as patient was in this rhythm with no symptoms while here. No sign of volume depletion. No events leading up to occurrence to suggest vasovagal response. At this time work-up is benign.  The patient has been asymptomatic since arriving to the emergency department and work-up shows no acute  findings.  Plan to discharge patient home.  We will put an order for ambulatory cardiology referral for follow-up for the junctional rhythm.  I discussed this with the patient the patient is in agreement with this plan.        Final Clinical Impression(s) / ED Diagnoses Final diagnoses:  Near syncope  Junctional rhythm    Rx / DC Orders ED Discharge Orders          Ordered    Ambulatory referral to Cardiology       Comments: EKG showing new junctional rhythm   03/13/22 1753              Ronny Bacon 03/13/22 1756    Wyvonnia Dusky, MD 03/14/22 1041

## 2022-03-13 NOTE — ED Triage Notes (Signed)
Had dizziness and off balance at 0900, states dizziness resolved and felt better, called pcp and was told to go to UC Was told to come to ER from UC Denies pain at this time

## 2022-03-13 NOTE — Telephone Encounter (Signed)
Pt went to ER

## 2022-03-13 NOTE — Telephone Encounter (Signed)
Pt called for lightheadedness. Transferred to triage.

## 2022-03-13 NOTE — Telephone Encounter (Signed)
ERROR

## 2022-03-14 ENCOUNTER — Ambulatory Visit: Payer: 59 | Admitting: Family Medicine

## 2022-03-14 DIAGNOSIS — L718 Other rosacea: Secondary | ICD-10-CM | POA: Insufficient documentation

## 2022-03-14 DIAGNOSIS — L731 Pseudofolliculitis barbae: Secondary | ICD-10-CM

## 2022-03-14 DIAGNOSIS — Z85828 Personal history of other malignant neoplasm of skin: Secondary | ICD-10-CM

## 2022-03-14 DIAGNOSIS — K759 Inflammatory liver disease, unspecified: Secondary | ICD-10-CM | POA: Insufficient documentation

## 2022-03-14 DIAGNOSIS — B079 Viral wart, unspecified: Secondary | ICD-10-CM

## 2022-03-14 DIAGNOSIS — D485 Neoplasm of uncertain behavior of skin: Secondary | ICD-10-CM | POA: Insufficient documentation

## 2022-03-14 DIAGNOSIS — L720 Epidermal cyst: Secondary | ICD-10-CM

## 2022-03-14 DIAGNOSIS — L814 Other melanin hyperpigmentation: Secondary | ICD-10-CM

## 2022-03-14 DIAGNOSIS — D1801 Hemangioma of skin and subcutaneous tissue: Secondary | ICD-10-CM | POA: Insufficient documentation

## 2022-03-14 DIAGNOSIS — L821 Other seborrheic keratosis: Secondary | ICD-10-CM | POA: Insufficient documentation

## 2022-03-14 DIAGNOSIS — D2262 Melanocytic nevi of left upper limb, including shoulder: Secondary | ICD-10-CM

## 2022-03-14 DIAGNOSIS — D225 Melanocytic nevi of trunk: Secondary | ICD-10-CM

## 2022-03-14 DIAGNOSIS — L570 Actinic keratosis: Secondary | ICD-10-CM

## 2022-03-14 DIAGNOSIS — C44310 Basal cell carcinoma of skin of unspecified parts of face: Secondary | ICD-10-CM

## 2022-03-14 DIAGNOSIS — C44712 Basal cell carcinoma of skin of right lower limb, including hip: Secondary | ICD-10-CM

## 2022-03-14 DIAGNOSIS — C44619 Basal cell carcinoma of skin of left upper limb, including shoulder: Secondary | ICD-10-CM

## 2022-03-14 DIAGNOSIS — L578 Other skin changes due to chronic exposure to nonionizing radiation: Secondary | ICD-10-CM | POA: Insufficient documentation

## 2022-03-14 HISTORY — DX: Pseudofolliculitis barbae: L73.1

## 2022-03-14 HISTORY — DX: Melanocytic nevi of trunk: D22.5

## 2022-03-14 HISTORY — DX: Other skin changes due to chronic exposure to nonionizing radiation: L57.8

## 2022-03-14 HISTORY — DX: Other seborrheic keratosis: L82.1

## 2022-03-14 HISTORY — DX: Other melanin hyperpigmentation: L81.4

## 2022-03-14 HISTORY — DX: Basal cell carcinoma of skin of left upper limb, including shoulder: C44.619

## 2022-03-14 HISTORY — DX: Personal history of other malignant neoplasm of skin: Z85.828

## 2022-03-14 HISTORY — DX: Viral wart, unspecified: B07.9

## 2022-03-14 HISTORY — DX: Melanocytic nevi of left upper limb, including shoulder: D22.62

## 2022-03-14 HISTORY — DX: Hemangioma of skin and subcutaneous tissue: D18.01

## 2022-03-14 HISTORY — DX: Basal cell carcinoma of skin of unspecified parts of face: C44.310

## 2022-03-14 HISTORY — DX: Basal cell carcinoma of skin of right lower limb, including hip: C44.712

## 2022-03-14 HISTORY — DX: Neoplasm of uncertain behavior of skin: D48.5

## 2022-03-14 HISTORY — DX: Epidermal cyst: L72.0

## 2022-03-14 HISTORY — DX: Actinic keratosis: L57.0

## 2022-03-16 ENCOUNTER — Ambulatory Visit (INDEPENDENT_AMBULATORY_CARE_PROVIDER_SITE_OTHER): Payer: 59 | Admitting: Cardiology

## 2022-03-16 ENCOUNTER — Encounter: Payer: Self-pay | Admitting: Cardiology

## 2022-03-16 ENCOUNTER — Ambulatory Visit (INDEPENDENT_AMBULATORY_CARE_PROVIDER_SITE_OTHER): Payer: 59

## 2022-03-16 VITALS — BP 146/80 | HR 71 | Ht 75.0 in | Wt 246.0 lb

## 2022-03-16 DIAGNOSIS — I4729 Other ventricular tachycardia: Secondary | ICD-10-CM

## 2022-03-16 DIAGNOSIS — E66811 Obesity, class 1: Secondary | ICD-10-CM

## 2022-03-16 DIAGNOSIS — E669 Obesity, unspecified: Secondary | ICD-10-CM

## 2022-03-16 DIAGNOSIS — I10 Essential (primary) hypertension: Secondary | ICD-10-CM

## 2022-03-16 DIAGNOSIS — R002 Palpitations: Secondary | ICD-10-CM

## 2022-03-16 DIAGNOSIS — R0609 Other forms of dyspnea: Secondary | ICD-10-CM

## 2022-03-16 DIAGNOSIS — I491 Atrial premature depolarization: Secondary | ICD-10-CM

## 2022-03-16 DIAGNOSIS — I4891 Unspecified atrial fibrillation: Secondary | ICD-10-CM

## 2022-03-16 HISTORY — DX: Palpitations: R00.2

## 2022-03-16 HISTORY — DX: Atrial premature depolarization: I49.1

## 2022-03-16 HISTORY — DX: Obesity, class 1: E66.811

## 2022-03-16 HISTORY — DX: Other forms of dyspnea: R06.09

## 2022-03-16 NOTE — Patient Instructions (Signed)
Please keep a BP log for 2 weeks and send by MyChart or mail.  Blood Pressure Record Sheet To take your blood pressure, you will need a blood pressure machine. You can buy a blood pressure machine (blood pressure monitor) at your clinic, drug store, or online. When choosing one, consider: An automatic monitor that has an arm cuff. A cuff that wraps snugly around your upper arm. You should be able to fit only one finger between your arm and the cuff. A device that stores blood pressure reading results. Do not choose a monitor that measures your blood pressure from your wrist or finger. Follow your health care provider's instructions for how to take your blood pressure. To use this form: Get one reading in the morning (a.m.) 1-2 hours after you take any medicines. Get one reading in the evening (p.m.) before supper. Write down the results in the spaces on this form. Repeat this once a week, or as told by your health care provider.  Make a follow-up appointment with your health care provider to discuss the results. Blood pressure log Date: _______________________ a.m. _____________________(1st reading) HR___________            p.m. _____________________(2nd reading) HR__________  Date: _______________________ a.m. _____________________(1st reading) HR___________            p.m. _____________________(2nd reading) HR__________ Date: _______________________ a.m. _____________________(1st reading) HR___________            p.m. _____________________(2nd reading) HR__________ Date: _______________________ a.m. _____________________(1st reading) HR___________            p.m. _____________________(2nd reading) HR__________  Date: _______________________ a.m. _____________________(1st reading) HR___________            p.m. _____________________(2nd reading) HR__________  Date: _______________________ a.m. _____________________(1st reading) HR___________            p.m.  _____________________(2nd reading) HR__________  Date: _______________________ a.m. _____________________(1st reading) HR___________            p.m. _____________________(2nd reading) HR__________   This information is not intended to replace advice given to you by your health care provider. Make sure you discuss any questions you have with your health care provider. Document Revised: 12/31/2019 Document Reviewed: 12/31/2019 Elsevier Patient Education  [place].   Medication Instructions:  Your physician recommends that you continue on your current medications as directed. Please refer to the Current Medication list given to you today.  *If you need a refill on your cardiac medications before your next appointment, please call your pharmacy*   Lab Work: Your physician recommends that you have a TSH done in the next few days. You do not need an appointment or to fast.  [facility] [address] in [place]. They also close daily for lunch for 12-1.   [place] [address] 2nd floor M-W 8-11:30 and 1-4:30 and Thursday and Friday 8-11:30.  If you have labs (blood work) drawn today and your tests are completely normal, you will receive your results only by: [place] (if you have MyChart) OR A paper copy in the mail If you have any lab test that is abnormal or we need to change your treatment, we will call you to review the results.   Testing/Procedures:      Stress Echocardiogram Information Sheet  Instructions:    1. You may take your morning medications the morning of the test  2. Light breakfast.  3. Dress prepared to exercise.  4. DO NOT use ANY caffeine or tobacco products 3 hours before appointment.  5. Please bring all current prescription medications.  Your physician has recommended that you wear an event monitor. Event monitors are medical devices that record the heart's electrical  activity. Doctors most often [place] these monitors to diagnose arrhythmias. Arrhythmias are problems with the speed or rhythm of the heartbeat. The monitor is a small, portable device. You can wear one while you do your normal daily activities. This is usually used to diagnose what is causing palpitations/syncope (passing out).  This is a 30 day monitor,   Follow-Up: At [facility], you and your health needs are our priority.  As part of our continuing mission to provide you with exceptional heart care, we have created designated Provider Care Teams.  These Care Teams include your primary Cardiologist (physician) and Advanced Practice Providers (APPs -  Physician Assistants and Nurse Practitioners) who all work together to provide you with the care you need, when you need it.  We recommend signing up for the patient portal called "MyChart".  Sign up information is provided on this After Visit Summary.  MyChart is used to connect with patients for Virtual Visits (Telemedicine).  Patients are able to view lab/test results, encounter notes, upcoming appointments, etc.  Non-urgent messages can be sent to your provider as well.   To learn more about what you can do with MyChart, go to [email or web address].    Your next appointment:   2 month(s)  The format for your next appointment:   In Person  Provider:   Jyl Heinz, MD   Other Instructions Exercise Stress Echocardiogram An exercise stress echocardiogram is a test to check how well your heart is working. This test uses sound waves and a computer to make pictures of your heart. These pictures will be taken before and after you exercise. For this test, you will walk on a treadmill or ride a bicycle to make your heart beat faster. While you exercise, your heart will be checked with an electrocardiogram (ECG). Your blood pressure will also be checked. You may have this test if: You have chest pain or a heart problem. You had a heart attack  or heart surgery not long ago. You have heart valve problems. You have a condition that causes narrowing of the blood vessels that supply your heart. You have a high risk of heart disease and: You are starting a new exercise program. You need to have a big surgery. Tell a doctor about: Any allergies you have. All medicines you are taking. This includes vitamins, herbs, eye drops, creams, and over-the-counter medicines. Any problems you or family members have had with medicines that make you fall asleep (anesthetic medicines). Any surgeries you have had. Any blood disorders you have. Any medical conditions you have. Whether you are pregnant or may be pregnant. What are the risks? Generally, this is a safe test. However, problems may occur, including: Chest pain. Feeling dizzy or light-headed. Shortness of breath. Increased or irregular heartbeat. Feeling like you may vomit (nausea) or vomiting. Heart attack. This is very rare. What happens before the test? Medicines Ask your doctor about changing or stopping your normal medicines. This is important if you take diabetes medicines or blood thinners. If you use an inhaler, bring it to the test. General instructions Wear comfortable  clothes and walking shoes. Follow instructions from your doctor about what you cannot eat or drink before the test. Do not drink or eat anything that has caffeine in it. Stop having caffeine 24 hours before the test. Do not smoke or use products that contain nicotine or tobacco for 4 hours before the test. If you need help quitting, ask your doctor. What happens during the test?  You will take off your clothes from the waist up and put on a hospital gown. Electrodes or patches will be put on your chest. A blood pressure cuff will be put on your arm. Before you exercise, a computer will make a picture of your heart. To do this: You will lie down and a gel will be put on your chest. A wand will be moved  over the gel. Sound waves from the wand will go to the computer to make the picture. Then, you will start to exercise. You may walk on a treadmill or pedal a bicycle. Your blood pressure and heart rhythm will be checked while you exercise. The exercise will get harder or faster. You will exercise until: Your heart reaches a certain level. You are too tired to go on. You cannot go on because of chest pain, weakness, or dizziness. You will lie down right away so another picture of your heart can be taken. The procedure may vary among doctors and hospitals. What can I expect after the test? After your test, it is common to have: Mild soreness. Mild tiredness. Your heart rate and blood pressure will be checked until they return to your normal levels. You should not have any new symptoms after this test. Follow these instructions at home: If your doctor says that you can, you may: Eat what you normally eat. Do your normal activities. Take over-the-counter and prescription medicines only as told by your doctor. Keep all follow-up visits. It is up to you to get the results of your test. Ask how to get your results when they are ready. Contact a doctor if: You feel dizzy or light-headed. You have a fast or irregular heartbeat. You feel like you may vomit or you vomit. You have a headache. You feel short of breath. Get help right away if: You develop pain or pressure: In your chest. In your jaw or neck. Between your shoulders. That goes down your left arm. You faint. You have trouble breathing. These symptoms may be an emergency. Get medical help right away. Call your local emergency services (911 in the [place] Do not wait to see if the symptoms will go away. Do not drive yourself to the hospital. Summary This is a test that checks how well your heart is working. Follow instructions about what you cannot eat or drink before the test. Ask your doctor if you should take your normal  medicines before the test. Stop having caffeine 24 hours before the test. Do not smoke or use products with nicotine or tobacco in them for 4 hours before the test. During the test, your blood pressure and heart rhythm will be checked while you exercise. This information is not intended to replace advice given to you by your health care provider. Make sure you discuss any questions you have with your health care provider. Document Revised: 05/25/2021 Document Reviewed: 05/04/2020 Elsevier Patient Education  2022 [employer].

## 2022-03-16 NOTE — Progress Notes (Signed)
Cardiology Office Note:    Date:  03/16/2022   ID:  Gilbert [name], DOB 1970/11/09, MRN [id number]  PCP:  Mackie Pai, PA-C  Cardiologist:  Jenean Lindau, MD   Referring MD: Dorothyann Peng, PA-C    ASSESSMENT:    1. Essential hypertension   2. Ectopic atrial rhythm   3. Obesity (BMI 30.0-34.9)   4. Dyspnea on exertion   5. Palpitations    PLAN:    In order of problems listed above:  Primary prevention stressed with the patient.  Importance of compliance with diet medication stressed any vocalized understanding. Near syncope: Patient clearly mentions to me that he has never passed out.  He is felt lightheaded on this occasion.  He does not remember his heart rate at that time.  But his blood pressure was fine it was in the 120/80 range.  He has had breakfast that morning when it happened.  I discussed my findings with him at length.  EKG reveals ectopic atrial rhythm.  I will do a 1 month monitor to assess palpitations and to assess for any bradycardia arrhythmias and he is agreeable.  We will also see if her TSH has been done recently. Essential hypertension: Blood pressure stable and diet was emphasized.  He is going to keep a track of his pulse and blood pressure at home. Dyspnea on exertion: We will do a exercise stress echo to assess his symptoms he leads a sedentary lifestyle and has risk factors for coronary artery disease.  This will also help me assess chronotropic competence. Patient will be seen in follow-up appointment in 6 months or earlier if the patient has any concerns    Medication Adjustments/Labs and Tests Ordered: Current medicines are reviewed at length with the patient today.  Concerns regarding medicines are outlined above.  No orders of the defined types were placed in this encounter.  No orders of the defined types were placed in this encounter.    History of Present Illness:    Gilbert [name] is a 51 y.o. male who is being seen today for  the evaluation of near syncope at the request of Ronny Bacon.  Patient has past medical history of essential hypertension.  He mentions to me that he was at work and his colleagues noted him to be pale and sweating.  He mentions that he almost had a passing out spell.  He did not pass out.  He said he would have fallen down because of weakness but his coworker helped him.  No chest pain orthopnea or PND.  He has some dyspnea on exertion.  He leads a sedentary lifestyle.  He occasionally has brief palpitations and these are not associated with the symptoms.  EKG has revealed ectopic atrial rhythm.  At the time of my evaluation, the patient is alert awake oriented and in no distress.  Past Medical History:  Diagnosis Date   ACHILLES TENDINITIS 12/28/2008   Qualifier: Diagnosis of  By: Birdie Riddle MD, [name]     Actinic keratosis 03/14/2022   Acute appendicitis 07/31/2018   Acute frontal sinusitis 09/01/2008   Qualifier: Diagnosis of  By: Jerold Coombe     Alcohol use 09/20/2018   Allergic rhinitis 09/16/2014   Anal inflammation 03/08/2018   Asthma 09/13/2013   BACK PAIN, CHRONIC 01/17/2007   Qualifier: Diagnosis of  By: Cletus Gash MD, [name]     Basal cell carcinoma [facility]) of left shoulder 03/14/2022   Basal cell carcinoma of face  03/14/2022   Basal cell carcinoma of right popliteal region 03/14/2022   CHEST PAIN 06/29/2009   Qualifier: Diagnosis of  By: Birdie Riddle MD, [facility] WALL PAIN, ANTERIOR 09/22/2010   Qualifier: Diagnosis of  By: Birdie Riddle MD, [name]     Chronic pain of right ankle 09/20/2018   Dysthymia 11/21/2018   Elevated blood sugar 09/20/2018   Elevated liver enzymes 07/19/2020   Essential hypertension 01/17/2007   Qualifier: Diagnosis of  By: Cletus Gash MD, [name]     External hemorrhoids 03/31/2008   Qualifier: Diagnosis of  By: Larose Kells MD, [place] 06/29/2009   Qualifier: Diagnosis of  By: Birdie Riddle MD, Lurena Nida medical examination 11/21/2011    GERD 06/29/2009   Qualifier: Diagnosis of  By: Birdie Riddle MD, [place] maintenance 09/10/2017   Hemangioma of skin and subcutaneous tissue 03/14/2022   Hepatitis    unknown type many yrs ago - no residual problems   History of malignant neoplasm of skin 03/14/2022   Insomnia    Lapband APL + [facility] repair July 2013 04/24/2012   Left arm numbness 02/05/2014   Leg wound, left 09/16/2014   Lentigo 03/14/2022   Low testosterone 09/10/2017   Lump in neck 04/22/2014   Medically noncompliant 07/16/2020   Melanocytic nevi of left upper limb, including shoulder 03/14/2022   Melanocytic nevi of trunk 03/14/2022   Milia 03/14/2022   Morbid obesity ([place])    Neoplasm of uncertain behavior of skin 03/14/2022   Night sweat 11/21/2018   OBESITY 03/31/2008   Qualifier: Diagnosis of  By: Larose Kells MD, [place]    Other seborrheic keratosis 03/14/2022   Other skin changes due to chronic exposure to nonionizing radiation 03/14/2022   Post-nasal drip 07/16/2020   Pruritus ani 05/19/36   Pseudofolliculitis barbae 0/48/8891   Vasomotor rhinitis 11/21/2018   Viral warts 03/14/2022   Vitamin D deficiency 09/23/2018    Past Surgical History:  Procedure Laterality Date   ANKLE ARTHROSCOPY WITH RECONSTRUCTION Right 09/26/2018   Procedure: ANKLE DEBRIDEMENT WITH POSSIBLE LATERAL LIGAMENT  RECONSTRUCTION;  Surgeon: Erle Crocker, MD;  Location: [place];  Service: Orthopedics;  Laterality: Right;   HIATAL HERNIA REPAIR  04/23/2012   Procedure: HERNIA REPAIR HIATAL;  Surgeon: Pedro Earls, MD;  Location: [facility];  Service: General;  Laterality: N/A;   KNEE SURGERY Right    fracture knee cap   LAPAROSCOPIC APPENDECTOMY N/A 07/31/2018   Procedure: APPENDECTOMY LAPAROSCOPIC;  Surgeon: Jovita Kussmaul, MD;  Location: [facility];  Service: General;  Laterality: N/A;   LAPAROSCOPIC GASTRIC BANDING  04/23/2012   Procedure: LAPAROSCOPIC GASTRIC BANDING;  Surgeon: Pedro Earls, MD;  Location: [facility];   Service: General;  Laterality: N/A;   LASIK      Current Medications: Current Meds  Medication Sig   enalapril (VASOTEC) 20 MG tablet TAKE 2 TABLETS (40 MG TOTAL) BY MOUTH DAILY.   levocetirizine (XYZAL) 5 MG tablet Take 5 mg by mouth every evening.     Allergies:   Penicillins   Social History   Socioeconomic History   Marital status: Married    Spouse name: Not on file   Number of children: Not on file   Years of education: Not on file   Highest education level: Not on file  Occupational History   Not on file  Tobacco Use   Smoking status: Never   Smokeless  tobacco: Never  Vaping Use   Vaping Use: Never used  Substance and Sexual Activity   Alcohol use: Yes    Alcohol/week: 5.0 standard drinks of alcohol    Types: 5 Shots of liquor per week    Comment: social   Drug use: No   Sexual activity: Not on file  Other Topics Concern   Not on file  Social History Narrative   Not on file   Social Determinants of Health   Financial Resource Strain: Not on file  Food Insecurity: Not on file  Transportation Needs: Not on file  Physical Activity: Not on file  Stress: Not on file  Social Connections: Not on file     Family History: The patient's family history includes Diabetes in his father; Heart disease in his father; Hypertension in his father. There is no history of Colon cancer, Colon polyps, Esophageal cancer, Rectal cancer, or Stomach cancer.  ROS:   Please see the history of present illness.    All other systems reviewed and are negative.  EKGs/Labs/Other Studies Reviewed:    The following studies were reviewed today: EKG reveals sinus rhythm and nonspecific ST-T changes.   Recent Labs: 12/07/2021: ALT 18 03/13/2022: BUN 9; Creatinine, Ser 0.73; Hemoglobin 14.2; Platelets 243; Potassium 3.8; Sodium 140  Recent Lipid Panel    Component Value Date/Time   CHOL 228 (H) 12/07/2021 1426   TRIG 45.0 12/07/2021 1426   HDL 143.90 12/07/2021 1426   CHOLHDL 2  12/07/2021 1426   VLDL 9.0 12/07/2021 1426   LDLCALC 75 12/07/2021 1426    Physical Exam:    VS:  BP (!) 146/80   Pulse 71   Ht '6\' 3"'$  (1.905 m)   Wt 246 lb 0.6 oz (111.6 kg)   SpO2 97%   BMI 30.75 kg/m     Wt Readings from Last 3 Encounters:  03/16/22 246 lb 0.6 oz (111.6 kg)  03/13/22 248 lb (112.5 kg)  12/07/21 235 lb (106.6 kg)     GEN: Patient is in no acute distress HEENT: Normal NECK: No JVD; No carotid bruits LYMPHATICS: No lymphadenopathy CARDIAC: S1 S2 regular, 2/6 systolic murmur at the apex. RESPIRATORY:  Clear to auscultation without rales, wheezing or rhonchi  ABDOMEN: Soft, non-tender, non-distended MUSCULOSKELETAL:  No edema; No deformity  SKIN: Warm and dry NEUROLOGIC:  Alert and oriented x 3 PSYCHIATRIC:  Normal affect    Signed, Jenean Lindau, MD  03/16/2022 4:27 PM    [place]

## 2022-03-18 LAB — TSH: TSH: 1.11 u[IU]/mL (ref 0.450–4.500)

## 2022-03-21 ENCOUNTER — Telehealth (HOSPITAL_COMMUNITY): Payer: Self-pay | Admitting: *Deleted

## 2022-03-22 ENCOUNTER — Ambulatory Visit (HOSPITAL_COMMUNITY): Payer: 59 | Attending: Cardiology

## 2022-03-22 ENCOUNTER — Ambulatory Visit (HOSPITAL_COMMUNITY): Payer: 59

## 2022-03-22 DIAGNOSIS — R0609 Other forms of dyspnea: Secondary | ICD-10-CM | POA: Diagnosis present

## 2022-03-22 LAB — ECHOCARDIOGRAM STRESS TEST
Area-P 1/2: 2.31 cm2
S' Lateral: 3.6 cm

## 2022-03-22 MED ORDER — PERFLUTREN LIPID MICROSPHERE
7.0000 mL | INTRAVENOUS | Status: AC | PRN
  Administered 2022-03-22: 7 mL via INTRAVENOUS

## 2022-03-24 ENCOUNTER — Other Ambulatory Visit: Payer: Self-pay | Admitting: Medical

## 2022-03-24 ENCOUNTER — Other Ambulatory Visit (HOSPITAL_BASED_OUTPATIENT_CLINIC_OR_DEPARTMENT_OTHER): Payer: Self-pay

## 2022-03-24 MED ORDER — HYDROCORTISONE ACETATE 25 MG RE SUPP
25.0000 mg | Freq: Two times a day (BID) | RECTAL | 1 refills | Status: DC | PRN
  Filled 2022-03-24: qty 12, 6d supply, fill #0

## 2022-03-27 MED FILL — Perflutren Lipid Microsphere IV Susp 1.1 MG/ML: INTRAVENOUS | Qty: 10 | Status: AC

## 2022-04-25 ENCOUNTER — Telehealth: Payer: Self-pay

## 2022-04-25 NOTE — Telephone Encounter (Signed)
-----   Message from Jenean Lindau, MD sent at 04/25/2022  9:02 AM EDT ----- Significantly abnormal and I would like him to be referred to Dr. Curt Bears for advice.  Copy primary care Jenean Lindau, MD 04/25/2022 9:01 AM

## 2022-04-25 NOTE — Telephone Encounter (Signed)
Left VM for pt to call back.

## 2022-05-25 ENCOUNTER — Ambulatory Visit: Payer: 59 | Admitting: Cardiology

## 2022-06-13 ENCOUNTER — Ambulatory Visit: Payer: 59 | Attending: Cardiology | Admitting: Cardiology

## 2022-06-13 ENCOUNTER — Encounter: Payer: Self-pay | Admitting: Cardiology

## 2022-06-13 VITALS — BP 140/86 | HR 70 | Ht 75.0 in | Wt 244.8 lb

## 2022-06-13 DIAGNOSIS — I10 Essential (primary) hypertension: Secondary | ICD-10-CM

## 2022-06-13 DIAGNOSIS — I480 Paroxysmal atrial fibrillation: Secondary | ICD-10-CM

## 2022-06-13 NOTE — Progress Notes (Signed)
Electrophysiology Office Note   Date:  06/13/2022   ID:  YVES FODOR, DOB [birth year], MRN [id number]  PCP:  Mackie Pai, PA-C  Cardiologist:  Revankar Primary Electrophysiologist:  Constance Haw, MD    Chief Complaint: AF   History of Present Illness: Gilbert [name] is a 51 y.o. male who is being seen today for the evaluation of AF at the request of Revankar, Reita Cliche, MD. Presenting today for electrophysiology evaluation.  He has a history significant for hypertension, obesity, atrial fibrillation.  He was apparently at work and was noted to be pale and sweating.  He had a near syncopal episode.  He was feeling weak and would have fallen down and had not been for his coworker.  He did not have chest pain, orthopnea, PND.  He does live a sedentary lifestyle.  He wore a cardiac monitor that showed episodes of atrial fibrillation.  Today, he denies symptoms of palpitations, chest pain, shortness of breath, orthopnea, PND, lower extremity edema, claudication, dizziness, presyncope, syncope, bleeding, or neurologic sequela. The patient is tolerating medications without difficulties.    Past Medical History:  Diagnosis Date   ACHILLES TENDINITIS 12/28/2008   Qualifier: Diagnosis of  By: Birdie Riddle MD, [name]     Actinic keratosis 03/14/2022   Acute appendicitis 07/31/2018   Acute frontal sinusitis 09/01/2008   Qualifier: Diagnosis of  By: Etter Sjogren DO, Kendrick Fries     Alcohol use 09/20/2018   Allergic rhinitis 09/16/2014   Anal inflammation 03/08/2018   Asthma 09/13/2013   BACK PAIN, CHRONIC 01/17/2007   Qualifier: Diagnosis of  By: Cletus Gash MD, [name]     Basal cell carcinoma ([place]) of left shoulder 03/14/2022   Basal cell carcinoma of face 03/14/2022   Basal cell carcinoma of right popliteal region 03/14/2022   CHEST PAIN 06/29/2009   Qualifier: Diagnosis of  By: Birdie Riddle MD, [facility] WALL PAIN, ANTERIOR 09/22/2010   Qualifier: Diagnosis of  By: Birdie Riddle MD, [name]      Chronic pain of right ankle 09/20/2018   Dysthymia 11/21/2018   Elevated blood sugar 09/20/2018   Elevated liver enzymes 07/19/2020   Essential hypertension 01/17/2007   Qualifier: Diagnosis of  By: Cletus Gash MD, [name]     External hemorrhoids 03/31/2008   Qualifier: Diagnosis of  By: Larose Kells MD, [place] 06/29/2009   Qualifier: Diagnosis of  By: Birdie Riddle MD, Lurena Nida medical examination 11/21/2011   GERD 06/29/2009   Qualifier: Diagnosis of  By: Birdie Riddle MD, [place] maintenance 09/10/2017   Hemangioma of skin and subcutaneous tissue 03/14/2022   Hepatitis    unknown type many yrs ago - no residual problems   History of malignant neoplasm of skin 03/14/2022   Insomnia    Lapband APL + [facility] repair July 2013 04/24/2012   Left arm numbness 02/05/2014   Leg wound, left 09/16/2014   Lentigo 03/14/2022   Low testosterone 09/10/2017   Lump in neck 04/22/2014   Medically noncompliant 07/16/2020   Melanocytic nevi of left upper limb, including shoulder 03/14/2022   Melanocytic nevi of trunk 03/14/2022   [place] 03/14/2022   Morbid obesity ([place])    Neoplasm of uncertain behavior of skin 03/14/2022   Night sweat 11/21/2018   OBESITY 03/31/2008   Qualifier: Diagnosis of  By: Larose Kells MD, [place]    Other seborrheic keratosis 03/14/2022   Other skin changes due  to chronic exposure to nonionizing radiation 03/14/2022   Post-nasal drip 07/16/2020   Pruritus ani 0/73/7106   Pseudofolliculitis barbae 2/69/4854   Vasomotor rhinitis 11/21/2018   Viral warts 03/14/2022   Vitamin D deficiency 09/23/2018   Past Surgical History:  Procedure Laterality Date   ANKLE ARTHROSCOPY WITH RECONSTRUCTION Right 09/26/2018   Procedure: ANKLE DEBRIDEMENT WITH POSSIBLE LATERAL LIGAMENT  RECONSTRUCTION;  Surgeon: Erle Crocker, MD;  Location: [facility];  Service: Orthopedics;  Laterality: Right;   HIATAL HERNIA REPAIR  04/23/2012   Procedure: HERNIA REPAIR HIATAL;  Surgeon:  Pedro Earls, MD;  Location: [facility];  Service: General;  Laterality: N/A;   KNEE SURGERY Right    fracture knee cap   LAPAROSCOPIC APPENDECTOMY N/A 07/31/2018   Procedure: APPENDECTOMY LAPAROSCOPIC;  Surgeon: Jovita Kussmaul, MD;  Location: [facility];  Service: General;  Laterality: N/A;   LAPAROSCOPIC GASTRIC BANDING  04/23/2012   Procedure: LAPAROSCOPIC GASTRIC BANDING;  Surgeon: Pedro Earls, MD;  Location: [facility];  Service: General;  Laterality: N/A;   LASIK       Current Outpatient Medications  Medication Sig Dispense Refill   clindamycin (CLEOCIN) 150 MG capsule Take 1 capsule (150 mg total) by mouth 3 (three) times daily. 30 capsule 0   enalapril (VASOTEC) 20 MG tablet TAKE 2 TABLETS (40 MG TOTAL) BY MOUTH DAILY. 180 tablet 1   fluticasone (FLONASE) 50 MCG/ACT nasal spray SPRAY 2 SPRAYS INTO EACH NOSTRIL EVERY DAY 48 mL 1   hydrocortisone (ANUSOL-HC) 25 MG suppository Unwrap and place 1 suppository (25 mg total) rectally 2 (two) times daily as needed for hemorrhoids or anal itching. 12 suppository 1   levocetirizine (XYZAL) 5 MG tablet Take 5 mg by mouth every evening.     No current facility-administered medications for this visit.    Allergies:   Penicillins   Social History:  The patient  reports that he has never smoked. He has never used smokeless tobacco. He reports current alcohol use of about 5.0 standard drinks of alcohol per week. He reports that he does not use drugs.   Family History:  The patient's family history includes Diabetes in his father; Heart disease in his father; Hypertension in his father.    ROS:  Please see the history of present illness.   Otherwise, review of systems is positive for none.   All other systems are reviewed and negative.    PHYSICAL EXAM: VS:  BP (!) 140/86   Pulse 70   Ht '6\' 3"'$  (1.905 m)   Wt 244 lb 12.8 oz (111 kg)   SpO2 96%   BMI 30.60 kg/m  , BMI Body mass index is 30.6 kg/m. GEN: Well nourished, well developed, in no  acute distress  HEENT: normal  Neck: no JVD, carotid bruits, or masses Cardiac: RRR; no murmurs, rubs, or gallops,no edema  Respiratory:  clear to auscultation bilaterally, normal work of breathing GI: soft, nontender, nondistended, + BS MS: no deformity or atrophy  Skin: warm and dry Neuro:  Strength and sensation are intact Psych: euthymic mood, full affect  EKG:  EKG is ordered today. Personal review of the ekg ordered shows sinus rhythm  Recent Labs: 12/07/2021: ALT 18 03/13/2022: BUN 9; Creatinine, Ser 0.73; Hemoglobin 14.2; Platelets 243; Potassium 3.8; Sodium 140 03/17/2022: TSH 1.110    Lipid Panel     Component Value Date/Time   CHOL 228 (H) 12/07/2021 1426   TRIG 45.0 12/07/2021 1426   HDL 143.90 12/07/2021 1426  CHOLHDL 2 12/07/2021 1426   VLDL 9.0 12/07/2021 1426   LDLCALC 75 12/07/2021 1426     Wt Readings from Last 3 Encounters:  06/13/22 244 lb 12.8 oz (111 kg)  03/16/22 246 lb 0.6 oz (111.6 kg)  03/13/22 248 lb (112.5 kg)      Other studies Reviewed: Additional studies/ records that were reviewed today include: Rest echo 03/22/2022 Review of the above records today demonstrates:   1. This is an inconclusive stress echocardiogram for ischemia.  Non-diagnostic study for ischemia due to Poor definity enhancement of the  apical LV during peak exercise. Cannot rule out apcial wall motion  abnormality during peak stress.   2. This is an indeterminate risk study.   Cardiac monitor 04/24/2022 personally reviewed Abnormal event monitor with paroxysmal atrial fibrillation and 1 episode of 4 beat nonsustained ventricular tachycardia.  ASSESSMENT AND PLAN:  1.  Paroxysmal atrial fibrillation: CHA2DS2-VASc of 1.  Currently not anticoagulated.  He had 28 minutes of atrial fibrillation on his monitor.  He is currently feeling well.  He was unaware of episodes of atrial fibrillation.  His episode of near syncope as above did not occur while he was wearing his monitor.   He has had only one unprovoked episode.  He Lavergne Hiltunen continue to monitor his arrhythmias on his Apple watch.  Should he have any further episodes of near syncope, he Pacer Dorn try and get a recording on his Apple watch.  2.  Obesity: Lifestyle modification encouraged Body mass index is 30.6 kg/m.  3.  Hypertension: Currently well controlled   Current medicines are reviewed at length with the patient today.   The patient does not have concerns regarding his medicines.  The following changes were made today:  none  Labs/ tests ordered today include:  Orders Placed This Encounter  Procedures   EKG 12-Lead     Disposition:   FU with Bladen Umar 6 months  Signed, Ziah Leandro Meredith Leeds, MD  06/13/2022 12:45 PM     [place] [address] [phone] (office) [phone] (fax)

## 2022-06-13 NOTE — Patient Instructions (Signed)
Medication Instructions:  Your physician recommends that you continue on your current medications as directed. Please refer to the Current Medication list given to you today.  *If you need a refill on your cardiac medications before your next appointment, please call your pharmacy*   Lab Work: None ordered   Testing/Procedures: None ordered   Follow-Up: At [facility], you and your health needs are our priority.  As part of our continuing mission to provide you with exceptional heart care, we have created designated Provider Care Teams.  These Care Teams include your primary Cardiologist (physician) and Advanced Practice Providers (APPs -  Physician Assistants and Nurse Practitioners) who all work together to provide you with the care you need, when you need it.  Your next appointment:   6 month(s)  The format for your next appointment:   In Person  Provider:   Will Camnitz, MD    Thank you for choosing [facility]!!   Towanda Hornstein, RN [phone]  Other Instructions   Important Information About Sugar           

## 2022-07-23 ENCOUNTER — Other Ambulatory Visit: Payer: Self-pay | Admitting: Medical

## 2022-07-23 DIAGNOSIS — I10 Essential (primary) hypertension: Secondary | ICD-10-CM

## 2022-09-11 DIAGNOSIS — M25561 Pain in right knee: Secondary | ICD-10-CM | POA: Diagnosis not present

## 2022-10-31 ENCOUNTER — Encounter: Payer: Self-pay | Admitting: Family Medicine

## 2022-10-31 ENCOUNTER — Other Ambulatory Visit (HOSPITAL_BASED_OUTPATIENT_CLINIC_OR_DEPARTMENT_OTHER): Payer: Self-pay

## 2022-10-31 ENCOUNTER — Ambulatory Visit: Payer: BC Managed Care – PPO | Admitting: Family Medicine

## 2022-10-31 VITALS — BP 141/100 | HR 94 | Temp 98.4°F | Resp 16 | Ht 75.0 in | Wt 242.2 lb

## 2022-10-31 DIAGNOSIS — H9201 Otalgia, right ear: Secondary | ICD-10-CM | POA: Diagnosis not present

## 2022-10-31 DIAGNOSIS — K644 Residual hemorrhoidal skin tags: Secondary | ICD-10-CM

## 2022-10-31 MED ORDER — HYDROCORTISONE ACETATE 25 MG RE SUPP
25.0000 mg | Freq: Two times a day (BID) | RECTAL | 1 refills | Status: DC | PRN
  Filled 2022-10-31: qty 12, 6d supply, fill #0

## 2022-10-31 NOTE — Progress Notes (Signed)
Acute Office Visit  Subjective:     Patient ID: Gilbert [name], male    DOB: Apr 09, 1971, 52 y.o.   MRN: [id number]  Chief Complaint  Patient presents with   Otalgia    Right ear Started Sunday    HPI   Ear Pain: Patient presents with right ear pain.  Symptoms include right ear pain. Symptoms began 2 days ago and are gradually improving since that time. Patient has had occasional nonproductive cough and sneezing. Sunday the discomfort was severe enough that he could not sleep on his right side. Pain is described as an ache, currently 3/10, improved compared to yesterday. He has had some muffled hearing. He tried ibuprofen yesterday, but that did not make much difference. He denies any fevers, chills, drainage, edema.     ROS All review of systems negative except what is listed in the HPI      Objective:    BP (!) 141/100   Pulse 94   Temp 98.4 F (36.9 C)   Resp 16   Ht '6\' 3"'$  (1.905 m)   Wt 242 lb 3.2 oz (109.9 kg)   SpO2 96%   BMI 30.27 kg/m    Physical Exam Vitals reviewed.  Constitutional:      General: He is not in acute distress.    Appearance: Normal appearance. He is not ill-appearing.  HENT:     Head: Normocephalic and atraumatic.     Right Ear: External ear normal. There is impacted cerumen.     Left Ear: Tympanic membrane, ear canal and external ear normal.  Musculoskeletal:     Cervical back: Normal range of motion and neck supple. No tenderness.  Lymphadenopathy:     Cervical: No cervical adenopathy.  Neurological:     General: No focal deficit present.     Mental Status: He is alert and oriented to person, place, and time. Mental status is at baseline.  Psychiatric:        Mood and Affect: Mood normal.        Behavior: Behavior normal.        Thought Content: Thought content normal.        Judgment: Judgment normal.     No results found for any visits on 10/31/22.      Assessment & Plan:   Problem List Items Addressed This Visit        Cardiovascular and Mediastinum   External hemorrhoids - Primary Refill provided   Relevant Medications   hydrocortisone (ANUSOL-HC) 25 MG suppository   Other Visit Diagnoses     Otalgia of right ear     Likely due to cerumen impaction. Unsuccessful irrigation in office today.  Recommend debrox drops for the next several days and follow-up for repeat attempt if symptoms persist.  Educated on signs/symptoms of AOM - notify me so we can start ABX if needed.   *Elevated blood pressure reading - monitor at home daily for the next week and send PCP message with home readings.        Meds ordered this encounter  Medications   hydrocortisone (ANUSOL-HC) 25 MG suppository    Sig: Unwrap and place 1 suppository (25 mg total) rectally 2 (two) times daily as needed for hemorrhoids or anal itching.    Dispense:  12 suppository    Refill:  1    Order Specific Question:   Supervising Provider    Answer:   Penni Homans A [[id number]]    Return if symptoms worsen  or fail to improve.  Terrilyn Saver, NP

## 2022-10-31 NOTE — Patient Instructions (Signed)
Unable to clean ear during visit. Recommend trying Debrox drops for the next several days. Come back at the end of this week or early next week for [place] to try to clean them out again if symptoms are not improving.  If you develop any new symptoms - sharp/stabbing pain, fevers, etc please let me know.

## 2022-11-02 ENCOUNTER — Other Ambulatory Visit (HOSPITAL_BASED_OUTPATIENT_CLINIC_OR_DEPARTMENT_OTHER): Payer: Self-pay

## 2022-12-14 ENCOUNTER — Encounter: Payer: Self-pay | Admitting: Medical

## 2022-12-14 ENCOUNTER — Ambulatory Visit (INDEPENDENT_AMBULATORY_CARE_PROVIDER_SITE_OTHER): Payer: BC Managed Care – PPO | Admitting: Medical

## 2022-12-14 ENCOUNTER — Other Ambulatory Visit (HOSPITAL_BASED_OUTPATIENT_CLINIC_OR_DEPARTMENT_OTHER): Payer: Self-pay

## 2022-12-14 ENCOUNTER — Ambulatory Visit (HOSPITAL_BASED_OUTPATIENT_CLINIC_OR_DEPARTMENT_OTHER)
Admission: RE | Admit: 2022-12-14 | Discharge: 2022-12-14 | Disposition: A | Discharge status: Home / Self Care | Payer: BC Managed Care – PPO | Source: Ambulatory Visit | Attending: Medical | Admitting: Medical

## 2022-12-14 VITALS — BP 139/90 | HR 99 | Resp 18 | Ht 75.0 in | Wt 236.0 lb

## 2022-12-14 DIAGNOSIS — H6123 Impacted cerumen, bilateral: Secondary | ICD-10-CM | POA: Diagnosis not present

## 2022-12-14 DIAGNOSIS — Z125 Encounter for screening for malignant neoplasm of prostate: Secondary | ICD-10-CM | POA: Diagnosis not present

## 2022-12-14 DIAGNOSIS — R0789 Other chest pain: Secondary | ICD-10-CM

## 2022-12-14 DIAGNOSIS — M79671 Pain in right foot: Secondary | ICD-10-CM

## 2022-12-14 DIAGNOSIS — Z23 Encounter for immunization: Secondary | ICD-10-CM

## 2022-12-14 DIAGNOSIS — M79672 Pain in left foot: Secondary | ICD-10-CM

## 2022-12-14 DIAGNOSIS — I10 Essential (primary) hypertension: Secondary | ICD-10-CM | POA: Diagnosis not present

## 2022-12-14 DIAGNOSIS — R0781 Pleurodynia: Secondary | ICD-10-CM

## 2022-12-14 DIAGNOSIS — R6882 Decreased libido: Secondary | ICD-10-CM | POA: Diagnosis not present

## 2022-12-14 DIAGNOSIS — Z0000 Encounter for general adult medical examination without abnormal findings: Secondary | ICD-10-CM

## 2022-12-14 DIAGNOSIS — H9313 Tinnitus, bilateral: Secondary | ICD-10-CM

## 2022-12-14 DIAGNOSIS — K644 Residual hemorrhoidal skin tags: Secondary | ICD-10-CM

## 2022-12-14 DIAGNOSIS — R7989 Other specified abnormal findings of blood chemistry: Secondary | ICD-10-CM

## 2022-12-14 DIAGNOSIS — R5383 Other fatigue: Secondary | ICD-10-CM | POA: Diagnosis not present

## 2022-12-14 DIAGNOSIS — H9193 Unspecified hearing loss, bilateral: Secondary | ICD-10-CM

## 2022-12-14 HISTORY — DX: Decreased libido: R68.82

## 2022-12-14 LAB — CBC WITH DIFFERENTIAL/PLATELET
Basophils Absolute: 0.1 10*3/uL (ref 0.0–0.1)
Basophils Relative: 1.4 % (ref 0.0–3.0)
Eosinophils Absolute: 0.2 10*3/uL (ref 0.0–0.7)
Eosinophils Relative: 3.6 % (ref 0.0–5.0)
HCT: 40.2 % (ref 39.0–52.0)
Hemoglobin: 13.7 g/dL (ref 13.0–17.0)
Lymphocytes Relative: 24.9 % (ref 12.0–46.0)
Lymphs Abs: 1.1 10*3/uL (ref 0.7–4.0)
MCHC: 34.1 g/dL (ref 30.0–36.0)
MCV: 102.5 fl — ABNORMAL HIGH (ref 78.0–100.0)
Monocytes Absolute: 0.4 10*3/uL (ref 0.1–1.0)
Monocytes Relative: 9.3 % (ref 3.0–12.0)
Neutro Abs: 2.6 10*3/uL (ref 1.4–7.7)
Neutrophils Relative %: 60.8 % (ref 43.0–77.0)
Platelets: 234 10*3/uL (ref 150.0–400.0)
RBC: 3.92 Mil/uL — ABNORMAL LOW (ref 4.22–5.81)
RDW: 12.6 % (ref 11.5–15.5)
WBC: 4.3 10*3/uL (ref 4.0–10.5)

## 2022-12-14 LAB — COMPREHENSIVE METABOLIC PANEL
ALT: 20 U/L (ref 0–53)
AST: 39 U/L — ABNORMAL HIGH (ref 0–37)
Albumin: 4.7 g/dL (ref 3.5–5.2)
Alkaline Phosphatase: 55 U/L (ref 39–117)
BUN: 7 mg/dL (ref 6–23)
CO2: 30 mEq/L (ref 19–32)
Calcium: 9.5 mg/dL (ref 8.4–10.5)
Chloride: 101 mEq/L (ref 96–112)
Creatinine, Ser: 0.66 mg/dL (ref 0.40–1.50)
GFR: 107.89 mL/min (ref >60.00)
Glucose, Bld: 80 mg/dL (ref 70–99)
Potassium: 4 mEq/L (ref 3.5–5.1)
Sodium: 143 mEq/L (ref 135–145)
Total Bilirubin: 0.6 mg/dL (ref 0.2–1.2)
Total Protein: 6.8 g/dL (ref 6.0–8.3)

## 2022-12-14 LAB — LIPID PANEL
Cholesterol: 230 mg/dL — ABNORMAL HIGH (ref 0–200)
HDL: 150.7 mg/dL (ref >39.00)
LDL Cholesterol: 70 mg/dL (ref 0–99)
NonHDL: 79.36
Total CHOL/HDL Ratio: 2
Triglycerides: 47 mg/dL (ref 0.0–149.0)
VLDL: 9.4 mg/dL (ref 0.0–40.0)

## 2022-12-14 LAB — PSA: PSA: 0.54 ng/mL (ref 0.10–4.00)

## 2022-12-14 LAB — T4, FREE: Free T4: 0.9 ng/dL (ref 0.60–1.60)

## 2022-12-14 LAB — TSH: TSH: 1.22 u[IU]/mL (ref 0.35–5.50)

## 2022-12-14 MED ORDER — HYDROCORTISONE ACETATE 25 MG RE SUPP
25.0000 mg | Freq: Two times a day (BID) | RECTAL | 1 refills | Status: DC | PRN
  Filled 2022-12-14: qty 12, 6d supply, fill #0
  Filled 2023-04-19: qty 12, 6d supply, fill #1

## 2022-12-14 NOTE — Patient Instructions (Addendum)
1.For you wellness exam today I have ordered cbc, cmp and lipid panel.  Shingrix vaccine today.  Recommend exercise and healthy diet.  We will let you know lab results as they come in.  Follow up date appointment will be determined after lab review.     2. External hemorrhoids Refill annusol hc supp.  3. Hypertension, unspecified type Your bp is mild high when I checked. Your machine seemed to give incorrect reading. Recommend trying new batteries and checking machine against pharmacy machine. Won't make changes to bp med today since you are seeing cardiologist next week.  4. Bilateral impacted cerumen in past but now no significant wax. Mostly tinnitus described and decrease hearing so refer to ENT.   5. Screening for prostate cancer - PSA  6. Fatigue, unspecified type - TSH - T4, free - Testosterone Total,Free,Bio, Males-([facility])  7. Decreased libido - Testosterone Total,Free,Bio, Males-([facility])  8. Foot pain, bilateral Placed referral to podiatrist - Ambulatory referral to Podiatry  Left lower rib pain after fall. Will get xray today. Bruise beneath rib area appears to have followed gravity  Follow up date to be determined after lab work review.  Preventive Care 1-33 Years Old, Male Preventive care refers to lifestyle choices and visits with your health care provider that can promote health and wellness. Preventive care visits are also called wellness exams. What can I expect for my preventive care visit? Counseling During your preventive care visit, your health care provider may ask about your: Medical history, including: Past medical problems. Family medical history. Current health, including: Emotional well-being. Home life and relationship well-being. Sexual activity. Lifestyle, including: Alcohol, nicotine or tobacco, and drug use. Access to firearms. Diet, exercise, and sleep habits. Safety issues such as seatbelt and bike helmet use. Sunscreen  use. Work and work Statistician. Physical exam Your health care provider will check your: Height and weight. These may be used to calculate your BMI (body mass index). BMI is a measurement that tells if you are at a healthy weight. Waist circumference. This measures the distance around your waistline. This measurement also tells if you are at a healthy weight and may help predict your risk of certain diseases, such as type 2 diabetes and high blood pressure. Heart rate and blood pressure. Body temperature. Skin for abnormal spots. What immunizations do I need?  Vaccines are usually given at various ages, according to a schedule. Your health care provider will recommend vaccines for you based on your age, medical history, and lifestyle or other factors, such as travel or where you work. What tests do I need? Screening Your health care provider may recommend screening tests for certain conditions. This may include: Lipid and cholesterol levels. Diabetes screening. This is done by checking your blood sugar (glucose) after you have not eaten for a while (fasting). Hepatitis B test. Hepatitis C test. HIV (human immunodeficiency virus) test. STI (sexually transmitted infection) testing, if you are at risk. Lung cancer screening. Prostate cancer screening. Colorectal cancer screening. Talk with your health care provider about your test results, treatment options, and if necessary, the need for more tests. Follow these instructions at home: Eating and drinking  Eat a diet that includes fresh fruits and vegetables, whole grains, lean protein, and low-fat dairy products. Take vitamin and mineral supplements as recommended by your health care provider. Do not drink alcohol if your health care provider tells you not to drink. If you drink alcohol: Limit how much you have to 0-2 drinks a day. Know  how much alcohol is in your drink. In the [place] one drink equals one 12 oz bottle of beer (355 mL),  one 5 oz glass of wine (148 mL), or one 1 oz glass of hard liquor (44 mL). Lifestyle Brush your teeth every morning and night with fluoride toothpaste. Floss one time each day. Exercise for at least 30 minutes 5 or more days each week. Do not use any products that contain nicotine or tobacco. These products include cigarettes, chewing tobacco, and vaping devices, such as e-cigarettes. If you need help quitting, ask your health care provider. Do not use drugs. If you are sexually active, practice safe sex. Use a condom or other form of protection to prevent STIs. Take aspirin only as told by your health care provider. Make sure that you understand how much to take and what form to take. Work with your health care provider to find out whether it is safe and beneficial for you to take aspirin daily. Find healthy ways to manage stress, such as: Meditation, yoga, or listening to music. Journaling. Talking to a trusted person. Spending time with friends and family. Minimize exposure to UV radiation to reduce your risk of skin cancer. Safety Always wear your seat belt while driving or riding in a vehicle. Do not drive: If you have been drinking alcohol. Do not ride with someone who has been drinking. When you are tired or distracted. While texting. If you have been using any mind-altering substances or drugs. Wear a helmet and other protective equipment during sports activities. If you have firearms in your house, make sure you follow all gun safety procedures. What's next? Go to your health care provider once a year for an annual wellness visit. Ask your health care provider how often you should have your eyes and teeth checked. Stay up to date on all vaccines. This information is not intended to replace advice given to you by your health care provider. Make sure you discuss any questions you have with your health care provider. Document Revised: 03/09/2021 Document Reviewed:  03/09/2021 Elsevier Patient Education  [place].

## 2022-12-14 NOTE — Assessment & Plan Note (Signed)
Tucks pads

## 2022-12-14 NOTE — Addendum Note (Signed)
Addended by: Jeronimo Greaves on: 12/14/2022 09:00 AM   Modules accepted: Orders

## 2022-12-14 NOTE — Progress Notes (Addendum)
Subjective:    Patient ID: Gilbert [name], male    DOB: December 04, 1970, 52 y.o.   MRN: [id number]  HPI  Pt in for cpe.   Pt is fasting. Pt works at [employer] in [place], just started walking 2 miles a day 3 days a week. . Moderate healthy diet but about 1/2 time unhealthy. Non smoker. 2 beers a day. Coffee in am and unsweet tea. No sodas.  Pt also has bilateral decreased hearing, wax and ringing in ears. He states at had attempted lavage but not cleared. He did try debrox as well. But still has problem.  He had hemorrhoids also on lat visit. Did respond to anusol. He request refill.   Htn- bp high today on first check. He states with his machine at home will get same reading.  Pt also states lateral aspect of foot pain bilaterally. Worse on rt side. He has tried to use wider shoe but pain persists. Pain present for 1.5 years  Also left lower rib area pain. One week ago he woke up and was walking in house in dark. Tripped and fell on coffee table. He has pain in rib. Bruise beneath lower rib area.   Review of Systems  Constitutional:  Positive for fatigue. Negative for chills and fever.  HENT:  Negative for congestion, ear discharge and ear pain.   Respiratory:  Negative for cough, chest tightness and shortness of breath.   Cardiovascular:  Negative for chest pain and palpitations.  Gastrointestinal:  Negative for abdominal pain.  Genitourinary:  Negative for dysuria, flank pain and frequency.       ED at times. Decreased libido.  Musculoskeletal:  Negative for back pain, neck pain and neck stiffness.       Feet pain.  Skin:  Negative for rash.  Neurological:  Negative for dizziness, seizures, weakness, light-headedness and headaches.  Hematological:  Negative for adenopathy. Does not bruise/bleed easily.  Psychiatric/Behavioral:  Negative for behavioral problems and decreased concentration.    Past Medical History:  Diagnosis Date   ACHILLES TENDINITIS 12/28/2008   Qualifier:  Diagnosis of  By: Birdie Riddle MD, [name]     Actinic keratosis 03/14/2022   Acute appendicitis 07/31/2018   Acute frontal sinusitis 09/01/2008   Qualifier: Diagnosis of  By: Etter Sjogren DO, Kendrick Fries     Alcohol use 09/20/2018   Allergic rhinitis 09/16/2014   Anal inflammation 03/08/2018   Asthma 09/13/2013   BACK PAIN, CHRONIC 01/17/2007   Qualifier: Diagnosis of  By: Cletus Gash MD, [name]     Basal cell carcinoma [facility]) of left shoulder 03/14/2022   Basal cell carcinoma of face 03/14/2022   Basal cell carcinoma of right popliteal region 03/14/2022   CHEST PAIN 06/29/2009   Qualifier: Diagnosis of  By: Birdie Riddle MD, [facility] WALL PAIN, ANTERIOR 09/22/2010   Qualifier: Diagnosis of  By: Birdie Riddle MD, [name]     Chronic pain of right ankle 09/20/2018   Dysthymia 11/21/2018   Elevated blood sugar 09/20/2018   Elevated liver enzymes 07/19/2020   Essential hypertension 01/17/2007   Qualifier: Diagnosis of  By: Cletus Gash MD, [name]     External hemorrhoids 03/31/2008   Qualifier: Diagnosis of  By: Larose Kells MD, [place] 06/29/2009   Qualifier: Diagnosis of  By: Birdie Riddle MDLurena Nida medical examination 11/21/2011   GERD 06/29/2009   Qualifier: Diagnosis of  By: Birdie Riddle MD, [place] maintenance 09/10/2017   Hemangioma  of skin and subcutaneous tissue 03/14/2022   Hepatitis    unknown type many yrs ago - no residual problems   History of malignant neoplasm of skin 03/14/2022   Insomnia    Lapband APL + HH repair July 2013 04/24/2012   Left arm numbness 02/05/2014   Leg wound, left 09/16/2014   Lentigo 03/14/2022   Low testosterone 09/10/2017   Lump in neck 04/22/2014   Medically noncompliant 07/16/2020   Melanocytic nevi of left upper limb, including shoulder 03/14/2022   Melanocytic nevi of trunk 03/14/2022   [place] 03/14/2022   Morbid obesity ([place])    Neoplasm of uncertain behavior of skin 03/14/2022   Night sweat 11/21/2018   OBESITY 03/31/2008   Qualifier: Diagnosis of   By: Larose Kells MD, [place]    Other seborrheic keratosis 03/14/2022   Other skin changes due to chronic exposure to nonionizing radiation 03/14/2022   Post-nasal drip 07/16/2020   Pruritus ani [id number]   Pseudofolliculitis barbae [id number]   Vasomotor rhinitis 11/21/2018   Viral warts 03/14/2022   Vitamin D deficiency 09/23/2018     Social History   Socioeconomic History   Marital status: Married    Spouse name: Not on file   Number of children: Not on file   Years of education: Not on file   Highest education level: Not on file  Occupational History   Not on file  Tobacco Use   Smoking status: Never   Smokeless tobacco: Never  Vaping Use   Vaping Use: Never used  Substance and Sexual Activity   Alcohol use: Yes    Alcohol/week: 5.0 standard drinks of alcohol    Types: 5 Shots of liquor per week    Comment: social   Drug use: No   Sexual activity: Not on file  Other Topics Concern   Not on file  Social History Narrative   Not on file   Social Determinants of Health   Financial Resource Strain: Not on file  Food Insecurity: Not on file  Transportation Needs: Not on file  Physical Activity: Not on file  Stress: Not on file  Social Connections: Not on file  Intimate Partner Violence: Not on file    Past Surgical History:  Procedure Laterality Date   ANKLE ARTHROSCOPY WITH RECONSTRUCTION Right 09/26/2018   Procedure: [place];  Surgeon: Erle Crocker, MD;  Location: [place];  Service: Orthopedics;  Laterality: Right;   HIATAL HERNIA REPAIR  04/23/2012   Procedure: HERNIA REPAIR HIATAL;  Surgeon: Pedro Earls, MD;  Location: [facility];  Service: General;  Laterality: N/A;   KNEE SURGERY Right    fracture knee cap   LAPAROSCOPIC APPENDECTOMY N/A 07/31/2018   Procedure: APPENDECTOMY LAPAROSCOPIC;  Surgeon: Jovita Kussmaul, MD;  Location: [facility];  Service: General;  Laterality: N/A;   LAPAROSCOPIC  GASTRIC BANDING  04/23/2012   Procedure: LAPAROSCOPIC GASTRIC BANDING;  Surgeon: Pedro Earls, MD;  Location: [facility];  Service: General;  Laterality: N/A;   LASIK      Family History  Problem Relation Age of Onset   Hypertension Father    Diabetes Father    Heart disease Father        pacemaker   Colon cancer Neg Hx    Colon polyps Neg Hx    Esophageal cancer Neg Hx    Rectal cancer Neg Hx    Stomach cancer Neg Hx  Allergies  Allergen Reactions   Penicillins Rash    Has patient had a PCN reaction causing immediate rash, facial/tongue/throat swelling, SOB or lightheadedness with hypotension: Unknown Has patient had a PCN reaction causing severe rash involving mucus membranes or skin necrosis: Unknown Has patient had a PCN reaction that required hospitalization: Unknown Has patient had a PCN reaction occurring within the last 10 years: No If all of the above answers are "NO", then may proceed with Cephalosporin use.     Current Outpatient Medications on File Prior to Visit  Medication Sig Dispense Refill   enalapril (VASOTEC) 20 MG tablet TAKE 2 TABLETS (40 MG TOTAL) BY MOUTH DAILY. 180 tablet 1   levocetirizine (XYZAL) 5 MG tablet Take 5 mg by mouth every evening.     No current facility-administered medications on file prior to visit.    BP (!) 139/90   Pulse 99   Resp 18   Ht 6\' 3"  (1.905 m)   Wt 236 lb (107 kg)   SpO2 99%   BMI 29.50 kg/m         Objective:   Physical Exam   General Mental Status- Alert. General Appearance- Not in acute distress.   Skin General: Color- Normal Color. Moisture- Normal Moisture.  Neck Carotid Arteries- Normal color. Moisture- Normal Moisture. No carotid bruits. No JVD.  Chest and Lung Exam Auscultation: Breath Sounds:-Normal.  Cardiovascular Auscultation:Rythm- Regular. Murmurs & Other Heart Sounds:Auscultation of the heart reveals- No Murmurs.  Abdomen Inspection:-Inspeection  Normal. Palpation/Percussion:Note:No mass. Palpation and Percussion of the abdomen reveal- Non Tender, Non Distended + BS, no rebound or guarding.   Neurologic Cranial Nerve exam:- CN III-XII intact(No nystagmus), symmetric smile. Strength:- 5/5 equal and symmetric strength both upper and lower extremities.    Heent- left canal scant wax. Normal tm. Rt ear canal clear and normal tm.  Left posterior- lower rib area tender to palpation. Bruise is in cva area but not tender. No mid spine pain.  Feet- both side some callous distal 5th metasrarsl area.     Assessment & Plan:   Patient Instructions  1.For you wellness exam today I have ordered cbc, cmp and lipid panel.  Shingrix vaccine today.  Recommend exercise and healthy diet.  We will let you know lab results as they come in.  Follow up date appointment will be determined after lab review.     2. External hemorrhoids Refill annusol hc supp.  3. Hypertension, unspecified type Your bp is mild high when I checked. Your machine seemed to give incorrect reading. Recommend trying new batteries and checking machine against pharmacy machine. Won't make changes to bp med today since you are seeing cardiologist next week.  4. Bilateral impacted cerumen I past but now no significant was. Mostly tinnitus described and decrease hearing so refer to ENT.   5. Screening for prostate cancer - PSA  6. Fatigue, unspecified type - TSH - T4, free - Testosterone Total,Free,Bio, Males-([facility])  7. Decreased libido - Testosterone Total,Free,Bio, Males-([facility])  8. Foot pain, bilateral Placed referral to podiatrist - Ambulatory referral to Podiatry  Left lower rib pain after fall. Will get xray today. Bruise beneath rib area appears to have followed gravity  Follow up date to be determined after lab work review.  Preventive Care 72-8 Years Old, Male Preventive care refers to lifestyle choices and visits with your health care provider  that can promote health and wellness. Preventive care visits are also called wellness exams. What can I expect for my  preventive care visit? Counseling During your preventive care visit, your health care provider may ask about your: Medical history, including: Past medical problems. Family medical history. Current health, including: Emotional well-being. Home life and relationship well-being. Sexual activity. Lifestyle, including: Alcohol, nicotine or tobacco, and drug use. Access to firearms. Diet, exercise, and sleep habits. Safety issues such as seatbelt and bike helmet use. Sunscreen use. Work and work Statistician. Physical exam Your health care provider will check your: Height and weight. These may be used to calculate your BMI (body mass index). BMI is a measurement that tells if you are at a healthy weight. Waist circumference. This measures the distance around your waistline. This measurement also tells if you are at a healthy weight and may help predict your risk of certain diseases, such as type 2 diabetes and high blood pressure. Heart rate and blood pressure. Body temperature. Skin for abnormal spots. What immunizations do I need?  Vaccines are usually given at various ages, according to a schedule. Your health care provider will recommend vaccines for you based on your age, medical history, and lifestyle or other factors, such as travel or where you work. What tests do I need? Screening Your health care provider may recommend screening tests for certain conditions. This may include: Lipid and cholesterol levels. Diabetes screening. This is done by checking your blood sugar (glucose) after you have not eaten for a while (fasting). Hepatitis B test. Hepatitis C test. HIV (human immunodeficiency virus) test. STI (sexually transmitted infection) testing, if you are at risk. Lung cancer screening. Prostate cancer screening. Colorectal cancer screening. Talk with your  health care provider about your test results, treatment options, and if necessary, the need for more tests. Follow these instructions at home: Eating and drinking  Eat a diet that includes fresh fruits and vegetables, whole grains, lean protein, and low-fat dairy products. Take vitamin and mineral supplements as recommended by your health care provider. Do not drink alcohol if your health care provider tells you not to drink. If you drink alcohol: Limit how much you have to 0-2 drinks a day. Know how much alcohol is in your drink. In the [place] one drink equals one 12 oz bottle of beer (355 mL), one 5 oz glass of wine (148 mL), or one 1 oz glass of hard liquor (44 mL). Lifestyle Brush your teeth every morning and night with fluoride toothpaste. Floss one time each day. Exercise for at least 30 minutes 5 or more days each week. Do not use any products that contain nicotine or tobacco. These products include cigarettes, chewing tobacco, and vaping devices, such as e-cigarettes. If you need help quitting, ask your health care provider. Do not use drugs. If you are sexually active, practice safe sex. Use a condom or other form of protection to prevent STIs. Take aspirin only as told by your health care provider. Make sure that you understand how much to take and what form to take. Work with your health care provider to find out whether it is safe and beneficial for you to take aspirin daily. Find healthy ways to manage stress, such as: Meditation, yoga, or listening to music. Journaling. Talking to a trusted person. Spending time with friends and family. Minimize exposure to UV radiation to reduce your risk of skin cancer. Safety Always wear your seat belt while driving or riding in a vehicle. Do not drive: If you have been drinking alcohol. Do not ride with someone who has been drinking. When you are  tired or distracted. While texting. If you have been using any mind-altering substances or  drugs. Wear a helmet and other protective equipment during sports activities. If you have firearms in your house, make sure you follow all gun safety procedures. What's next? Go to your health care provider once a year for an annual wellness visit. Ask your health care provider how often you should have your eyes and teeth checked. Stay up to date on all vaccines. This information is not intended to replace advice given to you by your health care provider. Make sure you discuss any questions you have with your health care provider. Document Revised: 03/09/2021 Document Reviewed: 03/09/2021 Elsevier Patient Education  [place] examination -     CBC with Differential/Platelet -     Comprehensive metabolic panel -     Lipid panel  External hemorrhoids -     Hydrocortisone Acetate; Unwrap and place 1 suppository (25 mg total) rectally 2 (two) times daily as needed for hemorrhoids or anal itching.  Dispense: 12 suppository; Refill: 1  Hypertension, unspecified type  Bilateral impacted cerumen  Screening for prostate cancer -     PSA  Fatigue, unspecified type -     TSH -     T4, free -     Testosterone Total,Free,Bio, Males  Decreased libido -     Testosterone Total,Free,Bio, Males  Foot pain, bilateral -     Ambulatory referral to Podiatry  Tinnitus aurium, bilateral -     Ambulatory referral to ENT  Decreased hearing of both ears -     Ambulatory referral to ENT  Rib pain on left side -     DG Ribs Unilateral Left; Future  Need for shingles vaccine -     Varicella-zoster vaccine IM      Mackie Pai, PA-C   [phone] charge as did address. Htn, tinniutus., feet pain, low libdo, refilled hemorroid med, fatigue and placed 2 referrals.

## 2022-12-15 ENCOUNTER — Other Ambulatory Visit (HOSPITAL_BASED_OUTPATIENT_CLINIC_OR_DEPARTMENT_OTHER): Payer: Self-pay

## 2022-12-15 LAB — TESTOSTERONE TOTAL,FREE,BIO, MALES
Albumin: 4.6 g/dL (ref 3.6–5.1)
Sex Hormone Binding: 72 nmol/L — ABNORMAL HIGH (ref 10–50)
Testosterone, Bioavailable: 59.4 ng/dL — ABNORMAL LOW (ref 110.0–575.0)
Testosterone, Free: 28.3 pg/mL — ABNORMAL LOW (ref 46.0–224.0)
Testosterone: 430 ng/dL (ref 250–827)

## 2022-12-15 MED ORDER — TRAMADOL HCL 50 MG PO TABS
50.0000 mg | ORAL_TABLET | Freq: Four times a day (QID) | ORAL | 0 refills | Status: AC | PRN
  Filled 2022-12-15: qty 12, 3d supply, fill #0

## 2022-12-15 NOTE — Addendum Note (Signed)
Addended by: Anabel Halon on: 12/15/2022 11:02 AM   Modules accepted: Orders

## 2022-12-15 NOTE — Addendum Note (Signed)
Addended by: Anabel Halon on: 12/15/2022 11:14 AM   Modules accepted: Orders

## 2022-12-19 ENCOUNTER — Ambulatory Visit: Payer: BC Managed Care – PPO | Attending: Cardiology | Admitting: Cardiology

## 2022-12-19 ENCOUNTER — Encounter: Payer: Self-pay | Admitting: Cardiology

## 2022-12-19 VITALS — BP 142/92 | HR 87 | Ht 75.0 in | Wt 237.0 lb

## 2022-12-19 DIAGNOSIS — I480 Paroxysmal atrial fibrillation: Secondary | ICD-10-CM

## 2022-12-19 DIAGNOSIS — I10 Essential (primary) hypertension: Secondary | ICD-10-CM | POA: Diagnosis not present

## 2022-12-19 NOTE — Patient Instructions (Signed)
Medication Instructions:  Your physician recommends that you continue on your current medications as directed. Please refer to the Current Medication list given to you today. *If you need a refill on your cardiac medications before your next appointment, please call your pharmacy*   Follow-Up: At [facility], you and your health needs are our priority.  As part of our continuing mission to provide you with exceptional heart care, we have created designated Provider Care Teams.  These Care Teams include your primary Cardiologist (physician) and Advanced Practice Providers (APPs -  Physician Assistants and Nurse Practitioners) who all work together to provide you with the care you need, when you need it.   Your next appointment:   As needed   Provider:   Allegra Lai, MD

## 2022-12-19 NOTE — Progress Notes (Signed)
Electrophysiology Office Note   Date:  12/19/2022   ID:  Gilbert [name], DOB [birth year], MRN [id number]  PCP:  Mackie Pai, PA-C  Cardiologist:  Revankar Primary Electrophysiologist:  Constance Haw, MD    Chief Complaint: AF   History of Present Illness: Gilbert [name] is a 52 y.o. male who is being seen today for the evaluation of AF at the request of Saguier, Percell Miller, [place]. Presenting today for electrophysiology evaluation.  He has a history significant for hypertension, obesity, atrial fibrillation.  He was at work and was noted to be pale and sweating.  Near syncopal episode.  He was feeling weak and would have fallen down if it had not been for a coworker.  He denied chest pain, orthopnea, PND.  He lives a sedentary lifestyle.  He wore a cardiac monitor which showed episodes of atrial fibrillation.  Today, denies symptoms of palpitations, chest pain, shortness of breath, orthopnea, PND, lower extremity edema, claudication, dizziness, presyncope, syncope, bleeding, or neurologic sequela. The patient is tolerating medications without difficulties.  Since being seen he has done well.  He has had no further episodes of syncope or near syncope.  He continues to be able to do all of his daily activities.  He has noted no further episodes of atrial fibrillation.    Past Medical History:  Diagnosis Date   ACHILLES TENDINITIS 12/28/2008   Qualifier: Diagnosis of  By: Birdie Riddle MD, [name]     Actinic keratosis 03/14/2022   Acute appendicitis 07/31/2018   Acute frontal sinusitis 09/01/2008   Qualifier: Diagnosis of  By: Etter Sjogren DO, Kendrick Fries     Alcohol use 09/20/2018   Allergic rhinitis 09/16/2014   Anal inflammation 03/08/2018   Asthma 09/13/2013   BACK PAIN, CHRONIC 01/17/2007   Qualifier: Diagnosis of  By: Cletus Gash MD, [name]     Basal cell carcinoma ([place]) of left shoulder 03/14/2022   Basal cell carcinoma of face 03/14/2022   Basal cell carcinoma of right popliteal region 03/14/2022    CHEST PAIN 06/29/2009   Qualifier: Diagnosis of  By: Birdie Riddle MD, [facility] WALL PAIN, ANTERIOR 09/22/2010   Qualifier: Diagnosis of  By: Birdie Riddle MD, [name]     Chronic pain of right ankle 09/20/2018   Dysthymia 11/21/2018   Elevated blood sugar 09/20/2018   Elevated liver enzymes 07/19/2020   Essential hypertension 01/17/2007   Qualifier: Diagnosis of  By: Cletus Gash MD, [name]     External hemorrhoids 03/31/2008   Qualifier: Diagnosis of  By: Larose Kells MD, [place] 06/29/2009   Qualifier: Diagnosis of  By: Birdie Riddle MD, Lurena Nida medical examination 11/21/2011   GERD 06/29/2009   Qualifier: Diagnosis of  By: Birdie Riddle MD, [place] maintenance 09/10/2017   Hemangioma of skin and subcutaneous tissue 03/14/2022   Hepatitis    unknown type many yrs ago - no residual problems   History of malignant neoplasm of skin 03/14/2022   Insomnia    Lapband APL + HH repair July 2013 04/24/2012   Left arm numbness 02/05/2014   Leg wound, left 09/16/2014   Lentigo 03/14/2022   Low testosterone 09/10/2017   Lump in neck 04/22/2014   Medically noncompliant 07/16/2020   Melanocytic nevi of left upper limb, including shoulder 03/14/2022   Melanocytic nevi of trunk 03/14/2022   Milia 03/14/2022   Morbid obesity ([place])    Neoplasm of uncertain behavior of skin 03/14/2022   Night  sweat 11/21/2018   OBESITY 03/31/2008   Qualifier: Diagnosis of  By: Larose Kells MD, [place]    Other seborrheic keratosis 03/14/2022   Other skin changes due to chronic exposure to nonionizing radiation 03/14/2022   Post-nasal drip 07/16/2020   Pruritus ani [id number]   Pseudofolliculitis barbae [id number]   Vasomotor rhinitis 11/21/2018   Viral warts 03/14/2022   Vitamin D deficiency 09/23/2018   Past Surgical History:  Procedure Laterality Date   ANKLE ARTHROSCOPY WITH RECONSTRUCTION Right 09/26/2018   Procedure: ANKLE DEBRIDEMENT WITH POSSIBLE LATERAL LIGAMENT  RECONSTRUCTION;   Surgeon: Erle Crocker, MD;  Location: [facility];  Service: Orthopedics;  Laterality: Right;   HIATAL HERNIA REPAIR  04/23/2012   Procedure: HERNIA REPAIR HIATAL;  Surgeon: Pedro Earls, MD;  Location: [facility];  Service: General;  Laterality: N/A;   KNEE SURGERY Right    fracture knee cap   LAPAROSCOPIC APPENDECTOMY N/A 07/31/2018   Procedure: APPENDECTOMY LAPAROSCOPIC;  Surgeon: Jovita Kussmaul, MD;  Location: [facility];  Service: General;  Laterality: N/A;   LAPAROSCOPIC GASTRIC BANDING  04/23/2012   Procedure: LAPAROSCOPIC GASTRIC BANDING;  Surgeon: Pedro Earls, MD;  Location[facility];  Service: General;  Laterality: N/A;   LASIK       Current Outpatient Medications  Medication Sig Dispense Refill   enalapril (VASOTEC) 20 MG tablet TAKE 2 TABLETS (40 MG TOTAL) BY MOUTH DAILY. 180 tablet 1   hydrocortisone (ANUSOL-HC) 25 MG suppository Unwrap and place 1 suppository (25 mg total) rectally 2 (two) times daily as needed for hemorrhoids or anal itching. 12 suppository 1   levocetirizine (XYZAL) 5 MG tablet Take 5 mg by mouth every evening.     traMADol (ULTRAM) 50 MG tablet Take 1 tablet (50 mg total) by mouth every 6 (six) hours as needed for up to 5 days for severe pain. 12 tablet 0   No current facility-administered medications for this visit.    Allergies:   Penicillins   Social History:  The patient  reports that he has never smoked. He has never used smokeless tobacco. He reports current alcohol use of about 5.0 standard drinks of alcohol per week. He reports that he does not use drugs.   Family History:  The patient's family history includes Diabetes in his father; Heart disease in his father; Hypertension in his father.   ROS:  Please see the history of present illness.   Otherwise, review of systems is positive for none.   All other systems are reviewed and negative.   PHYSICAL EXAM: VS:  BP (!) 142/92 (BP Location: Right Arm, Patient Position: Sitting, Cuff Size: Normal)    Pulse 87   Ht 6\' 3"  (1.905 m)   Wt 237 lb (107.5 kg)   SpO2 97%   BMI 29.62 kg/m  , BMI Body mass index is 29.62 kg/m. GEN: Well nourished, well developed, in no acute distress  HEENT: normal  Neck: no JVD, carotid bruits, or masses Cardiac: RRR; no murmurs, rubs, or gallops,no edema  Respiratory:  clear to auscultation bilaterally, normal work of breathing GI: soft, nontender, nondistended, + BS MS: no deformity or atrophy  Skin: warm and dry Neuro:  Strength and sensation are intact Psych: euthymic mood, full affect  EKG:  EKG is ordered today. Personal review of the ekg ordered shows sinus rhythm   Recent Labs: 12/14/2022: ALT 20; BUN 7; Creatinine, Ser 0.66; Hemoglobin 13.7; Platelets 234.0; Potassium 4.0; Sodium 143; TSH 1.22  Lipid Panel     Component Value Date/Time   CHOL 230 (H) 12/14/2022 0857   TRIG 47.0 12/14/2022 0857   HDL 150.70 12/14/2022 0857   CHOLHDL 2 12/14/2022 0857   VLDL 9.4 12/14/2022 0857   LDLCALC 70 12/14/2022 0857     Wt Readings from Last 3 Encounters:  12/19/22 237 lb (107.5 kg)  12/14/22 236 lb (107 kg)  10/31/22 242 lb 3.2 oz (109.9 kg)      Other studies Reviewed: Additional studies/ records that were reviewed today include: Rest echo 03/22/2022 Review of the above records today demonstrates:   1. This is an inconclusive stress echocardiogram for ischemia.  Non-diagnostic study for ischemia due to Poor definity enhancement of the  apical LV during peak exercise. Cannot rule out apcial wall motion  abnormality during peak stress.   2. This is an indeterminate risk study.   Cardiac monitor 04/24/2022 personally reviewed Abnormal event monitor with paroxysmal atrial fibrillation and 1 episode of 4 beat nonsustained ventricular tachycardia.  ASSESSMENT AND PLAN:  1.  Paroxysmal atrial fibrillation: CHA2DS2-VASc of 1.  Currently not anticoagulated.  Wore a cardiac monitor with a short episode of atrial fibrillation.  Monitor was  worn for syncope.  No further episodes.  Kamdin Follett continue monitoring.  2.  Obesity: Lifestyle modification encouraged Body mass index is 29.62 kg/m.  3.  Hypertension: Elevated today.  Was also elevated at his primary physicians.  On recheck it is coming down.  Plan per primary care.    Current medicines are reviewed at length with the patient today.   The patient does not have concerns regarding his medicines.  The following changes were made today: None  Labs/ tests ordered today include:  Orders Placed This Encounter  Procedures   EKG 12-Lead     Disposition:   FU as needed months  Signed, Coltrane Tugwell Meredith Leeds, MD  12/19/2022 8:32 AM     [address] [place] [address] [phone] (office) [phone] (fax)

## 2022-12-20 ENCOUNTER — Encounter: Payer: Self-pay | Admitting: Medical

## 2022-12-21 ENCOUNTER — Ambulatory Visit: Payer: BC Managed Care – PPO | Admitting: Podiatry

## 2022-12-21 ENCOUNTER — Encounter: Payer: Self-pay | Admitting: Podiatry

## 2022-12-21 ENCOUNTER — Ambulatory Visit (INDEPENDENT_AMBULATORY_CARE_PROVIDER_SITE_OTHER): Payer: BC Managed Care – PPO

## 2022-12-21 DIAGNOSIS — M21621 Bunionette of right foot: Secondary | ICD-10-CM | POA: Diagnosis not present

## 2022-12-21 DIAGNOSIS — M7751 Other enthesopathy of right foot: Secondary | ICD-10-CM | POA: Diagnosis not present

## 2022-12-21 DIAGNOSIS — M7752 Other enthesopathy of left foot: Secondary | ICD-10-CM

## 2022-12-21 DIAGNOSIS — M722 Plantar fascial fibromatosis: Secondary | ICD-10-CM

## 2022-12-21 DIAGNOSIS — M21622 Bunionette of left foot: Secondary | ICD-10-CM

## 2022-12-21 MED ORDER — TRIAMCINOLONE ACETONIDE 10 MG/ML IJ SUSP
20.0000 mg | Freq: Once | INTRAMUSCULAR | Status: AC
  Administered 2022-12-21: 20 mg

## 2022-12-21 NOTE — Progress Notes (Signed)
Subjective:   Patient ID: Gilbert [name], male   DOB: 52 y.o.   MRN: [id number]   HPI Patient presents stating got a lot of pain in the outside of both my feet with inflammation noted of both feet that sore.  States has been going on about 6 months he wants to be active and run is not able to do it does not smoke likes to be active   Review of Systems  All other systems reviewed and are negative.       Objective:  Physical Exam Vitals and nursing note reviewed.  Constitutional:      Appearance: He is well-developed.  Pulmonary:     Effort: Pulmonary effort is normal.  Musculoskeletal:        General: Normal range of motion.  Skin:    General: Skin is warm.  Neurological:     Mental Status: He is alert.     Neurovascular status intact muscle strength found to be adequate range of motion adequate with prominence around the fifth metatarsal head bilateral and on the right 1 I noted there to be keratotic lesion associated with it with fluid buildup plantar lateral aspect fifth MPJ bilateral and very tender when pressed.  Good digital perfusion well-oriented     Assessment:  Inflammatory capsulitis of the fifth MPJ bilateral with fluid buildup around the joint surface with tailor's bunion deformity     Plan:  H&P discussed condition and discussed possibilities for future treatments which may be necessary but today did sterile prep injected around the fifth MPJ bilateral 3 mg dexamethasone Kenalog 5 mg Xylocaine debrided lesion right and patient will be seen back as needed and that we will determine what else we may need to do based on response  X-rays indicate there is some structural enlargement around the fifth metatarsal head bilateral no other pathology noted

## 2022-12-26 NOTE — Telephone Encounter (Signed)
Please see below.

## 2023-01-02 DIAGNOSIS — H9313 Tinnitus, bilateral: Secondary | ICD-10-CM | POA: Diagnosis not present

## 2023-01-28 ENCOUNTER — Other Ambulatory Visit: Payer: Self-pay | Admitting: Medical

## 2023-01-28 DIAGNOSIS — I10 Essential (primary) hypertension: Secondary | ICD-10-CM

## 2023-02-24 DIAGNOSIS — R4781 Slurred speech: Secondary | ICD-10-CM | POA: Diagnosis not present

## 2023-02-24 DIAGNOSIS — R410 Disorientation, unspecified: Secondary | ICD-10-CM | POA: Diagnosis not present

## 2023-02-24 DIAGNOSIS — R29818 Other symptoms and signs involving the nervous system: Secondary | ICD-10-CM | POA: Diagnosis not present

## 2023-02-24 DIAGNOSIS — R29810 Facial weakness: Secondary | ICD-10-CM | POA: Diagnosis not present

## 2023-02-24 DIAGNOSIS — Z5181 Encounter for therapeutic drug level monitoring: Secondary | ICD-10-CM | POA: Diagnosis not present

## 2023-02-24 DIAGNOSIS — G8911 Acute pain due to trauma: Secondary | ICD-10-CM | POA: Diagnosis not present

## 2023-02-24 DIAGNOSIS — R55 Syncope and collapse: Secondary | ICD-10-CM | POA: Diagnosis not present

## 2023-02-24 DIAGNOSIS — F10129 Alcohol abuse with intoxication, unspecified: Secondary | ICD-10-CM | POA: Diagnosis not present

## 2023-02-26 DIAGNOSIS — R29818 Other symptoms and signs involving the nervous system: Secondary | ICD-10-CM | POA: Diagnosis not present

## 2023-03-15 ENCOUNTER — Ambulatory Visit: Payer: BC Managed Care – PPO | Admitting: Medical

## 2023-03-15 ENCOUNTER — Other Ambulatory Visit (HOSPITAL_BASED_OUTPATIENT_CLINIC_OR_DEPARTMENT_OTHER): Payer: Self-pay

## 2023-03-15 VITALS — BP 122/80 | HR 82 | Resp 18 | Ht 75.0 in | Wt 230.2 lb

## 2023-03-15 DIAGNOSIS — R5383 Other fatigue: Secondary | ICD-10-CM

## 2023-03-15 DIAGNOSIS — I10 Essential (primary) hypertension: Secondary | ICD-10-CM

## 2023-03-15 DIAGNOSIS — R7989 Other specified abnormal findings of blood chemistry: Secondary | ICD-10-CM

## 2023-03-15 DIAGNOSIS — N529 Male erectile dysfunction, unspecified: Secondary | ICD-10-CM

## 2023-03-15 LAB — VITAMIN B12: Vitamin B-12: 396 pg/mL (ref 211–911)

## 2023-03-15 LAB — IRON: Iron: 143 ug/dL (ref 42–165)

## 2023-03-15 MED ORDER — ENALAPRIL MALEATE 20 MG PO TABS
40.0000 mg | ORAL_TABLET | Freq: Every day | ORAL | 1 refills | Status: DC
Start: 2023-03-15 — End: 2023-09-04
  Filled 2023-03-15 – 2023-08-08 (×2): qty 180, 90d supply, fill #0

## 2023-03-15 MED ORDER — SILDENAFIL CITRATE 100 MG PO TABS
50.0000 mg | ORAL_TABLET | Freq: Every day | ORAL | 11 refills | Status: DC | PRN
  Filled 2023-03-15: qty 5, 5d supply, fill #0
  Filled 2023-03-20: qty 5, 5d supply, fill #1
  Filled 2023-03-25: qty 5, 5d supply, fill #2
  Filled 2023-04-09: qty 5, 5d supply, fill #3
  Filled 2023-04-12 (×2): qty 5, 5d supply, fill #4
  Filled 2023-04-19: qty 5, 5d supply, fill #5
  Filled 2023-04-24: qty 5, 5d supply, fill #6
  Filled 2023-05-07: qty 5, 5d supply, fill #7
  Filled 2023-05-14: qty 5, 5d supply, fill #8
  Filled 2023-05-22: qty 5, 5d supply, fill #9
  Filled 2023-06-04: qty 5, 5d supply, fill #10

## 2023-03-15 NOTE — Patient Instructions (Addendum)
For ED- Rx viagra. Rx advisement given.   For fatigue- get b12, b1 and iron level. Other labs done recently.  For low T see endocrinologist as planned or you may see integrative medicine.   Follow up March or sooner if needed.

## 2023-03-15 NOTE — Progress Notes (Signed)
Subjective:    Patient ID: Gilbert [name], male    DOB: 03-Jul-1971, 52 y.o.   MRN: [id number]  HPI   Recently seen in the ED below are notes.  "Gilbert [name] is a 52 y.o. male with a history significant for HTN, paroxysmal Afib (not on [facility]), and EtOH abuse who presented via EMS for symptoms of slurred speech and leaning to the right.  ED time of arrival: 02/24/2023 9562  Code stroke called at 0832 LKN: 0730 on 02/24/2023 Duration of Symptoms: > 60 minutes Have the symptoms resolved? No  On 02/24/2023 at ~0745, patient was discovered to have these stroke-like symptoms at a diner. He walked into the diner in his normal state of health, confirmed by EMS on camera footage. LKN at 0730 AM. He ordered food, then staff noticed that the patient had not started to eat and asked him some questions. He responded with slurred speech and eventually slumped backwards off of the stool he was sitting on. EMS was called and noticed that he was leaning to the right side, had slurred speech and difficulty looking to the left. He was transported to [facility] and CODE STROKE was called. Initial NIHSS of 15.   His only home medication is enalapril.   I spoke with the patient's ex-wife, Gilbert [name], regarding the patient's history. She confirms he only takes Enalapril and has history of alcohol abuse and possible Afib. She last saw him on Friday and he was in his normal state of health. She denies prior strokes, seizures, head trauma.  Was the patient taking anti-platelets or anti-coagulation at home? No  Was the patient taking a statin at home? No  Ambulatory status prior to event: Ambulatory without assistance  Degree of disability prior to event: MRS 1  CT head without contrast independently viewed by myself at 0900.  It demonstrated no acute hemorrhage.  Patient was not a candidate for IV thrombolytics due to the following contraindications, warnings and/or other factors: Patient symptoms rapidly  improving, also high ethanol level and concern for acute intoxication rather than stroke "    After work up in ED. Patient is afebrile, and Labs returned and demonstrated significantly elevated ethanol level 473. No leukocytosis, hemoglobin 13.9, CMP with no severe electrolyte derangements, mild AST elevation of 41, no AKI. TSH and free T4 WNL, ammonia WNL. UA negative. Salicylate, Tylenol levels negative. I personally interpreted patient's EKG, which demonstrates normal sinus rhythm with a rate of 60 bpm, normal PR interval, QRS duration, and QTc. There are no ST elevations or depressions, no acute ischemic changes.  Upon review of patient's labs and in conjunction with improving mentation, neurology decided against TNK as patient is likely experiencing symptoms of intoxication. I agree that patient's presentation is likely due to alcohol intoxication. I continually reassessed the patient, and he had improvement in his dysarthria and mental status. He is alert and oriented x 4. He is able to tell me that he drank liquor this morning prior to going to the diner. Given negative C-spine imaging and no midline neck pain, c-collar was cleared at bedside with full, painless neck ROM. I performed my final full neurologic assessment around 12:30 PM. Patient had no focal neurologic deficits, and I personally ambulated the patient in the ED, to which she did not require assistance. He has ambulated with nursing and medical student without any difficulty. He tolerated p.o. intake at bedside. I explained our assessment and workup to the patient as well as his mother  at bedside, and patient was discharged with his mom in stable condition. I gave the patient strict return precautions.   Pt update me that he rarely drinks excessively above rare event.   Now here today with erectile dyfunction complaint. Not on any nitroglycerin. He states he has been separated from his wife over past 3 years. In the past had  intermittent ED with erectile issues. He states his divorce final since December. Recently he went out with new friend. He states had ED. Pt states can see endocrinologist until sept. He has low T.  Pt willing to try viagra.  Pt also update me walking 5-6 miles a day. Doing that 5 days a week. He states not eating as much but not restricted types of food.  Some fatigue recently as well.   Review of Systems  Constitutional:  Positive for fatigue.  Respiratory:  Negative for cough, chest tightness, shortness of breath and wheezing.   Cardiovascular:  Negative for chest pain and palpitations.  Gastrointestinal:  Negative for abdominal pain.  Genitourinary:        ED.  Musculoskeletal:  Negative for back pain.  Neurological:  Negative for facial asymmetry, speech difficulty and light-headedness.    Past Medical History:  Diagnosis Date   ACHILLES TENDINITIS 12/28/2008   Qualifier: Diagnosis of  By: Beverely Low MD, [name]     Actinic keratosis 03/14/2022   Acute appendicitis 07/31/2018   Acute frontal sinusitis 09/01/2008   Qualifier: Diagnosis of  By: Laury Axon DO, Myrene Buddy     Alcohol use 09/20/2018   Allergic rhinitis 09/16/2014   Anal inflammation 03/08/2018   Asthma 09/13/2013   BACK PAIN, CHRONIC 01/17/2007   Qualifier: Diagnosis of  By: Blossom Hoops MD, [name]     Basal cell carcinoma ([facility]) of left shoulder 03/14/2022   Basal cell carcinoma of face 03/14/2022   Basal cell carcinoma of right popliteal region 03/14/2022   CHEST PAIN 06/29/2009   Qualifier: Diagnosis of  By: Beverely Low MD, [facility] WALL PAIN, ANTERIOR 09/22/2010   Qualifier: Diagnosis of  By: Beverely Low MD, [name]     Chronic pain of right ankle 09/20/2018   Dysthymia 11/21/2018   Elevated blood sugar 09/20/2018   Elevated liver enzymes 07/19/2020   Essential hypertension 01/17/2007   Qualifier: Diagnosis of  By: Blossom Hoops MD, [name]     External hemorrhoids 03/31/2008   Qualifier: Diagnosis of  By: Drue Novel MD, Nolon Rod.     GASTROENTERITIS 06/29/2009   Qualifier: Diagnosis of  By: Beverely Low MD, [facility] medical examination 11/21/2011   GERD 06/29/2009   Qualifier: Diagnosis of  By: Beverely Low MD, [facility]     Healthcare maintenance 09/10/2017   Hemangioma of skin and subcutaneous tissue 03/14/2022   Hepatitis    unknown type many yrs ago - no residual problems   History of malignant neoplasm of skin 03/14/2022   Insomnia    Lapband APL + [facility] repair July 2013 04/24/2012   Left arm numbness 02/05/2014   Leg wound, left 09/16/2014   Lentigo 03/14/2022   Low testosterone 09/10/2017   Lump in neck 04/22/2014   Medically noncompliant 07/16/2020   Melanocytic nevi of left upper limb, including shoulder 03/14/2022   Melanocytic nevi of trunk 03/14/2022   Milia 03/14/2022   Morbid obesity ([facility])    Neoplasm of uncertain behavior of skin 03/14/2022   Night sweat 11/21/2018   OBESITY 03/31/2008   Qualifier: Diagnosis of  By: Drue Novel MD, Nolon Rod.  Ocular rosacea    Other seborrheic keratosis 03/14/2022   Other skin changes due to chronic exposure to nonionizing radiation 03/14/2022   Post-nasal drip 07/16/2020   Pruritus ani 11/21/2018   Pseudofolliculitis barbae 03/14/2022   Vasomotor rhinitis 11/21/2018   Viral warts 03/14/2022   Vitamin D deficiency 09/23/2018     Social History   Socioeconomic History   Marital status: Married    Spouse name: Not on file   Number of children: Not on file   Years of education: Not on file   Highest education level: Not on file  Occupational History   Not on file  Tobacco Use   Smoking status: Never   Smokeless tobacco: Never  Vaping Use   Vaping Use: Never used  Substance and Sexual Activity   Alcohol use: Yes    Alcohol/week: 5.0 standard drinks of alcohol    Types: 5 Shots of liquor per week    Comment: social   Drug use: No   Sexual activity: Not on file  Other Topics Concern   Not on file  Social History Narrative   Not on file   Social Determinants of Health    Financial Resource Strain: Not on file  Food Insecurity: Not on file  Transportation Needs: Not on file  Physical Activity: Not on file  Stress: Not on file  Social Connections: Not on file  Intimate Partner Violence: Not on file    Past Surgical History:  Procedure Laterality Date   ANKLE ARTHROSCOPY WITH RECONSTRUCTION Right 09/26/2018   Procedure: ANKLE DEBRIDEMENT WITH POSSIBLE LATERAL LIGAMENT  RECONSTRUCTION;  Surgeon: Terance Hart, MD;  Location: [facility];  Service: Orthopedics;  Laterality: Right;   HIATAL HERNIA REPAIR  04/23/2012   Procedure: HERNIA REPAIR HIATAL;  Surgeon: Valarie Merino, MD;  [facility];  Service: General;  Laterality: N/A;   KNEE SURGERY Right    fracture knee cap   LAPAROSCOPIC APPENDECTOMY N/A 07/31/2018   Procedure: APPENDECTOMY LAPAROSCOPIC;  Surgeon: Griselda Miner, MD;  [facility];  Service: General;  Laterality: N/A;   LAPAROSCOPIC GASTRIC BANDING  04/23/2012   Procedure: LAPAROSCOPIC GASTRIC BANDING;  Surgeon: Valarie Merino, MD;  [facility];  Service: General;  Laterality: N/A;   LASIK      Family History  Problem Relation Age of Onset   Hypertension Father    Diabetes Father    Heart disease Father        pacemaker   Colon cancer Neg Hx    Colon polyps Neg Hx    Esophageal cancer Neg Hx    Rectal cancer Neg Hx    Stomach cancer Neg Hx     Allergies  Allergen Reactions   Penicillins Rash    Has patient had a PCN reaction causing immediate rash, facial/tongue/throat swelling, SOB or lightheadedness with hypotension: Unknown Has patient had a PCN reaction causing severe rash involving mucus membranes or skin necrosis: Unknown Has patient had a PCN reaction that required hospitalization: Unknown Has patient had a PCN reaction occurring within the last 10 years: No If all of the above answers are "NO", then may proceed with Cephalosporin use.     Current Outpatient Medications on File Prior to Visit   Medication Sig Dispense Refill   enalapril (VASOTEC) 20 MG tablet TAKE 2 TABLETS (40 MG TOTAL) BY MOUTH DAILY. 180 tablet 1   hydrocortisone (ANUSOL-HC) 25 MG suppository Unwrap and place 1 suppository (25 mg total) rectally 2 (two) times  daily as needed for hemorrhoids or anal itching. 12 suppository 1   levocetirizine (XYZAL) 5 MG tablet Take 5 mg by mouth every evening.     No current facility-administered medications on file prior to visit.    BP 122/80   Pulse 82   Resp 18   Ht 6\' 3"  (1.905 m)   Wt 230 lb 3.2 oz (104.4 kg)   SpO2 99%   BMI 28.77 kg/m        Objective:   Physical Exam  General Mental Status- Alert. General Appearance- Not in acute distress.   Skin General: Color- Normal Color. Moisture- Normal Moisture.  Neck Carotid Arteries- Normal color. Moisture- Normal Moisture. No carotid bruits. No JVD.  Chest and Lung Exam Auscultation: Breath Sounds:-Normal.  Cardiovascular Auscultation:Rythm- Regular. Murmurs & Other Heart Sounds:Auscultation of the heart reveals- No Murmurs.   Neurologic Cranial Nerve exam:- CN III-XII intact(No nystagmus), symmetric smile. Strength:- 5/5 equal and symmetric strength both upper and lower extremities.       Assessment & Plan:   Patient Instructions  For ED- Rx viagra. Rx advisement given.   For fatigue- get b12, b1 and iron level. Other labs done recently.  For low T see endocrinologist as planned or you may see integrative medicine.   Follow up March or sooner if needed.    Esperanza Richters, PA-C

## 2023-03-19 LAB — VITAMIN B1: Vitamin B1 (Thiamine): <6 nmol/L — ABNORMAL LOW (ref 8–30)

## 2023-03-20 ENCOUNTER — Other Ambulatory Visit (HOSPITAL_BASED_OUTPATIENT_CLINIC_OR_DEPARTMENT_OTHER): Payer: Self-pay

## 2023-03-26 ENCOUNTER — Other Ambulatory Visit (HOSPITAL_BASED_OUTPATIENT_CLINIC_OR_DEPARTMENT_OTHER): Payer: Self-pay

## 2023-03-27 ENCOUNTER — Other Ambulatory Visit (HOSPITAL_BASED_OUTPATIENT_CLINIC_OR_DEPARTMENT_OTHER): Payer: Self-pay

## 2023-04-09 ENCOUNTER — Other Ambulatory Visit (HOSPITAL_BASED_OUTPATIENT_CLINIC_OR_DEPARTMENT_OTHER): Payer: Self-pay

## 2023-04-12 ENCOUNTER — Encounter: Payer: Self-pay | Admitting: Family Medicine

## 2023-04-12 ENCOUNTER — Other Ambulatory Visit: Payer: Self-pay

## 2023-04-12 ENCOUNTER — Ambulatory Visit: Payer: BC Managed Care – PPO | Admitting: Family Medicine

## 2023-04-12 ENCOUNTER — Telehealth: Payer: Self-pay | Admitting: Medical

## 2023-04-12 ENCOUNTER — Other Ambulatory Visit (HOSPITAL_BASED_OUTPATIENT_CLINIC_OR_DEPARTMENT_OTHER): Payer: Self-pay

## 2023-04-12 VITALS — BP 110/79 | HR 98 | Ht 75.0 in | Wt 227.0 lb

## 2023-04-12 DIAGNOSIS — R600 Localized edema: Secondary | ICD-10-CM

## 2023-04-12 NOTE — Telephone Encounter (Signed)
Pt would like to transfer care to [facility] due to pcp's availability.

## 2023-04-12 NOTE — Progress Notes (Signed)
Acute Office Visit  Subjective:     Patient ID: Gilbert [name], male    DOB: 03-19-1971, 52 y.o.   MRN: [id number]  Chief Complaint  Patient presents with   Edema    HPI Patient is in today for lower extremity edema.  Discussed the use of AI scribe software for clinical note transcription with the patient, who gave verbal consent to proceed.  History of Present Illness   The patient, with a history of hypertension, presented with recent onset lower extremity edema. He noticed the swelling after a period of increased physical activity, including walking approximately 17 miles outdoors in hot weather over the past 2-3 days. The swelling was most pronounced in the ankles, with the patient noting that his ankle bones were not visible due to the edema. The swelling was worse the previous day, with pitting edema note. When he woke up this morning, his legs were almost back baseline. The patient denied any associated pain or signs of infection in the affected areas.  The patient also reported a history of erectile dysfunction, for which he was prescribed sildenafil. He reported that the medication was effective, but expressed frustration with the limited quantity he was able to obtain at a time. He is going to check with pharmacy/insurance to see what his max allowance would be.   The patient had not been monitoring his blood pressure at home, but it was noted to be high during the consultation. He is currently on enalapril for hypertension management. Despite the recent onset of lower extremity edema, the patient denied any associated symptoms such as chest pain or shortness of breath.  The patient has been actively trying to lose weight and reported a 10-pound weight loss since his last physical examination. He expressed a goal to lose an additional 20 pounds.          ROS All review of systems negative except what is listed in the HPI      Objective:    BP 110/79   Pulse 98    Ht 6\' 3"  (1.905 m)   Wt 227 lb (103 kg)   SpO2 98%   BMI 28.37 kg/m    Physical Exam Vitals reviewed.  Constitutional:      Appearance: Normal appearance.  Cardiovascular:     Rate and Rhythm: Normal rate and regular rhythm.     Heart sounds: Normal heart sounds.  Pulmonary:     Effort: Pulmonary effort is normal.     Breath sounds: Normal breath sounds.  Musculoskeletal:     Comments: Minimal non-pitting edema to bilateral ankles  Skin:    General: Skin is warm and dry.  Neurological:     Mental Status: He is alert and oriented to person, place, and time.  Psychiatric:        Mood and Affect: Mood normal.        Behavior: Behavior normal.        Thought Content: Thought content normal.        Judgment: Judgment normal.     No results found for any visits on 04/12/23.      Assessment & Plan:   Problem List Items Addressed This Visit   None Visit Diagnoses     Bilateral lower extremity edema    -  Primary        Lower Extremity Edema: Likely secondary to prolonged standing and heat exposure. No associated chest pain or shortness of breath. No signs of infection. Blood  pressure within normal limits. Labs last month were stable.  -Advise to elevate legs when seated. -Consider use of compression socks, especially during prolonged periods of standing. -Monitor blood pressure at home and report if consistently above 130s. -If any new symptoms develop or swelling becomes severe, let us know so we can further investigate.        No orders of the defined types were placed in this encounter.   Return if symptoms worsen or fail to improve.  Clayborne Dana, NP

## 2023-04-17 ENCOUNTER — Other Ambulatory Visit (HOSPITAL_COMMUNITY): Payer: Self-pay

## 2023-04-20 ENCOUNTER — Other Ambulatory Visit (HOSPITAL_BASED_OUTPATIENT_CLINIC_OR_DEPARTMENT_OTHER): Payer: Self-pay

## 2023-04-24 ENCOUNTER — Other Ambulatory Visit (HOSPITAL_BASED_OUTPATIENT_CLINIC_OR_DEPARTMENT_OTHER): Payer: Self-pay

## 2023-05-08 ENCOUNTER — Other Ambulatory Visit (HOSPITAL_BASED_OUTPATIENT_CLINIC_OR_DEPARTMENT_OTHER): Payer: Self-pay

## 2023-05-15 ENCOUNTER — Other Ambulatory Visit (HOSPITAL_BASED_OUTPATIENT_CLINIC_OR_DEPARTMENT_OTHER): Payer: Self-pay

## 2023-05-15 ENCOUNTER — Other Ambulatory Visit: Payer: Self-pay

## 2023-05-23 ENCOUNTER — Other Ambulatory Visit (HOSPITAL_BASED_OUTPATIENT_CLINIC_OR_DEPARTMENT_OTHER): Payer: Self-pay

## 2023-06-04 ENCOUNTER — Other Ambulatory Visit (HOSPITAL_BASED_OUTPATIENT_CLINIC_OR_DEPARTMENT_OTHER): Payer: Self-pay

## 2023-06-04 DIAGNOSIS — L578 Other skin changes due to chronic exposure to nonionizing radiation: Secondary | ICD-10-CM | POA: Diagnosis not present

## 2023-06-04 DIAGNOSIS — D225 Melanocytic nevi of trunk: Secondary | ICD-10-CM | POA: Diagnosis not present

## 2023-06-04 DIAGNOSIS — L570 Actinic keratosis: Secondary | ICD-10-CM | POA: Diagnosis not present

## 2023-06-04 DIAGNOSIS — L814 Other melanin hyperpigmentation: Secondary | ICD-10-CM | POA: Diagnosis not present

## 2023-06-04 DIAGNOSIS — D2262 Melanocytic nevi of left upper limb, including shoulder: Secondary | ICD-10-CM | POA: Diagnosis not present

## 2023-06-13 DIAGNOSIS — R7989 Other specified abnormal findings of blood chemistry: Secondary | ICD-10-CM | POA: Diagnosis not present

## 2023-06-15 ENCOUNTER — Ambulatory Visit: Payer: BC Managed Care – PPO | Admitting: Family Medicine

## 2023-06-15 ENCOUNTER — Other Ambulatory Visit (HOSPITAL_BASED_OUTPATIENT_CLINIC_OR_DEPARTMENT_OTHER): Payer: Self-pay

## 2023-06-15 ENCOUNTER — Encounter: Payer: Self-pay | Admitting: Family Medicine

## 2023-06-15 VITALS — BP 136/89 | HR 89 | Ht 75.0 in | Wt 217.0 lb

## 2023-06-15 DIAGNOSIS — N529 Male erectile dysfunction, unspecified: Secondary | ICD-10-CM

## 2023-06-15 DIAGNOSIS — R748 Abnormal levels of other serum enzymes: Secondary | ICD-10-CM

## 2023-06-15 DIAGNOSIS — Z1159 Encounter for screening for other viral diseases: Secondary | ICD-10-CM

## 2023-06-15 DIAGNOSIS — R7989 Other specified abnormal findings of blood chemistry: Secondary | ICD-10-CM | POA: Diagnosis not present

## 2023-06-15 LAB — COMPREHENSIVE METABOLIC PANEL
ALT: 50 U/L (ref 0–53)
AST: 75 U/L — ABNORMAL HIGH (ref 0–37)
Albumin: 4.4 g/dL (ref 3.5–5.2)
Alkaline Phosphatase: 59 U/L (ref 39–117)
BUN: 8 mg/dL (ref 6–23)
CO2: 29 mEq/L (ref 19–32)
Calcium: 9.1 mg/dL (ref 8.4–10.5)
Chloride: 98 mEq/L (ref 96–112)
Creatinine, Ser: 0.73 mg/dL (ref 0.40–1.50)
GFR: 104.29 mL/min (ref >60.00)
Glucose, Bld: 79 mg/dL (ref 70–99)
Potassium: 3.8 mEq/L (ref 3.5–5.1)
Sodium: 140 mEq/L (ref 135–145)
Total Bilirubin: 0.8 mg/dL (ref 0.2–1.2)
Total Protein: 6.6 g/dL (ref 6.0–8.3)

## 2023-06-15 MED ORDER — SILDENAFIL CITRATE 100 MG PO TABS
50.0000 mg | ORAL_TABLET | Freq: Every day | ORAL | 5 refills | Status: DC | PRN
Start: 2023-06-15 — End: 2024-01-18
  Filled 2023-06-15: qty 10, 10d supply, fill #0
  Filled 2023-08-08: qty 10, 10d supply, fill #1
  Filled 2023-11-30: qty 10, 10d supply, fill #2
  Filled 2023-12-10: qty 7, 7d supply, fill #3
  Filled 2023-12-26: qty 14, 14d supply, fill #4
  Filled 2024-01-09: qty 14, 14d supply, fill #5

## 2023-06-15 NOTE — Progress Notes (Signed)
Acute Office Visit  Subjective:     Patient ID: Gilbert [name], male    DOB: Oct 27, 1970, 52 y.o.   MRN: [id number]  Chief Complaint  Patient presents with   Medical Management of Chronic Issues    HPI Patient is in today for lab concern.   Discussed the use of AI scribe software for clinical note transcription with the patient, who gave verbal consent to proceed.  History of Present Illness   The patient was recently informed by the [employer] that he was ineligible to donate due to a potential Hepatitis B issue. This was surprising to the patient as he had never been informed of such a diagnosis by a healthcare provider. The patient's last Hepatitis B and C screenings were in 2021 and were negative. The patient is concerned about the implications of a potential Hepatitis B diagnosis and has requested retesting for peace of mind.  The patient has been using Viagra for erectile dysfunction and has requested a refill of this medication.          Wt Readings from Last 3 Encounters:  06/15/23 217 lb (98.4 kg)  04/12/23 227 lb (103 kg)  03/15/23 230 lb 3.2 oz (104.4 kg)      ROS All review of systems negative except what is listed in the HPI      Objective:    BP 136/89   Pulse 89   Ht 6\' 3"  (1.905 m)   Wt 217 lb (98.4 kg)   SpO2 99%   BMI 27.12 kg/m    Physical Exam Vitals reviewed.  Constitutional:      Appearance: Normal appearance.  Cardiovascular:     Rate and Rhythm: Normal rate and regular rhythm.     Heart sounds: Normal heart sounds.  Pulmonary:     Effort: Pulmonary effort is normal.     Breath sounds: Normal breath sounds.  Skin:    General: Skin is warm and dry.  Neurological:     Mental Status: He is alert and oriented to person, place, and time.  Psychiatric:        Mood and Affect: Mood normal.        Behavior: Behavior normal.        Thought Content: Thought content normal.        Judgment: Judgment normal.     No results found for  any visits on 06/15/23.      Assessment & Plan:   Problem List Items Addressed This Visit   None Visit Diagnoses     Need for hepatitis B screening test    -  Primary   Relevant Orders   Hepatitis B surface antibody,quantitative   Hepatitis B surface antigen   Comp Met (CMET)   Hepatitis B core antibody, IgM   Erectile dysfunction, unspecified erectile dysfunction type       Relevant Medications   sildenafil (VIAGRA) 100 MG tablet         Hepatitis B screening Patient was informed by the [employer] that he was ineligible to donate blood due to a potential Hepatitis B issue. Previous screening in 2021 was negative. Patient has no known history of Hepatitis B infection or vaccination. -Order Hepatitis B testing for further evaluation.  Erectile Dysfunction Patient requested a refill of Viagra. -Refill Viagra prescription with a quantity of 14 tablets and 5 refills. Send to the downstairs pharmacy.       Meds ordered this encounter  Medications   sildenafil (  VIAGRA) 100 MG tablet    Sig: Take 0.5-1 tablets (50-100 mg total) by mouth daily as needed for erectile dysfunction.    Dispense:  14 tablet    Refill:  5    Order Specific Question:   Supervising Provider    Answer:   Danise Edge A [[id number]]    Return if symptoms worsen or fail to improve.  Clayborne Dana, NP

## 2023-06-20 LAB — HEPATITIS B SURFACE ANTIBODY, QUANTITATIVE: Hep B S AB Quant (Post): <5 m[IU]/mL — ABNORMAL LOW (ref >=10)

## 2023-06-20 LAB — HEPATITIS B SURFACE ANTIGEN: Hepatitis B Surface Ag: NONREACTIVE

## 2023-06-20 LAB — HEPATITIS B CORE ANTIBODY, IGM: Hep B C IgM: NONREACTIVE

## 2023-06-21 NOTE — Addendum Note (Signed)
Addended by: Hyman Hopes B on: 06/21/2023 07:47 AM   Modules accepted: Orders

## 2023-07-02 ENCOUNTER — Ambulatory Visit: Payer: BC Managed Care – PPO | Admitting: Family Medicine

## 2023-07-02 ENCOUNTER — Encounter: Payer: Self-pay | Admitting: Family Medicine

## 2023-07-02 VITALS — BP 127/78 | HR 73 | Resp 18 | Ht 75.0 in | Wt 218.2 lb

## 2023-07-02 DIAGNOSIS — N6342 Unspecified lump in left breast, subareolar: Secondary | ICD-10-CM

## 2023-07-02 DIAGNOSIS — Z23 Encounter for immunization: Secondary | ICD-10-CM | POA: Diagnosis not present

## 2023-07-02 NOTE — Progress Notes (Signed)
   Acute Office Visit  Subjective:     Patient ID: Gilbert [name], male    DOB: [birth year], 52 y.o.   MRN: [id number]  Chief Complaint  Patient presents with   Follow-up    Lump under L nipple onset one week  Sore 5 out of 10 pain scale     HPI Patient is in today for breast concern.   Discussed the use of AI scribe software for clinical note transcription with the patient, who gave verbal consent to proceed.  History of Present Illness   The patient, with an unspecified medical history, presented with a new onset of left breast tenderness and a palpable lump. The lump, described as 'a little ball,' was discovered approximately a week ago and is associated with pain upon palpation. The patient denies any associated rash, redness, fever, chills, body aches, or nipple discharge. They also deny any recent trauma or strenuous activity that could potentially explain the new symptom. The patient has not noticed any similar lumps in the right breast or in the armpits.          ROS All review of systems negative except what is listed in the HPI      Objective:    BP 127/78   Pulse 73   Resp 18   Ht 6\' 3"  (1.905 m)   Wt 218 lb 3.2 oz (99 kg)   SpO2 99%   BMI 27.27 kg/m    Physical Exam Vitals reviewed.  Constitutional:      Appearance: Normal appearance.  Chest:     Chest wall: No edema.  Breasts:    Breasts are symmetrical.     Right: No swelling, bleeding, inverted nipple, mass, nipple discharge, skin change or tenderness.     Left: Tenderness present. No swelling, bleeding, inverted nipple, nipple discharge or skin change.    Lymphadenopathy:     Upper Body:     Right upper body: No supraclavicular or axillary adenopathy.     Left upper body: No supraclavicular or axillary adenopathy.  Neurological:     Mental Status: He is alert and oriented to person, place, and time.  Psychiatric:        Mood and Affect: Mood normal.        Behavior: Behavior normal.         Thought Content: Thought content normal.        Judgment: Judgment normal.     No results found for any visits on 07/02/23.      Assessment & Plan:   Problem List Items Addressed This Visit   None Visit Diagnoses     Subareolar mass of left breast    -  Primary   Relevant Orders   MM 3D DIAGNOSTIC MAMMOGRAM UNILATERAL LEFT BREAST   US BREAST COMPLETE UNI LEFT INC AXILLA   Need for hepatitis B vaccination       Relevant Orders   Heplisav-B (HepB-CPG) Vaccine (Completed)         Left Breast Mass New onset, tender, pea-sized mass in left breast tissue. No associated rash, redness, fever, chills, body aches, or nipple discharge. No known family history of breast cancer. -Order breast imaging to further evaluate.  -Patient aware of signs/symptoms requiring further/urgent evaluation.       No orders of the defined types were placed in this encounter.   Return if symptoms worsen or fail to improve.  Clayborne Dana, NP

## 2023-07-12 ENCOUNTER — Ambulatory Visit: Payer: BC Managed Care – PPO

## 2023-07-12 ENCOUNTER — Other Ambulatory Visit (INDEPENDENT_AMBULATORY_CARE_PROVIDER_SITE_OTHER): Payer: BC Managed Care – PPO

## 2023-07-12 DIAGNOSIS — R748 Abnormal levels of other serum enzymes: Secondary | ICD-10-CM

## 2023-07-12 LAB — COMPREHENSIVE METABOLIC PANEL
ALT: 33 U/L (ref 0–53)
AST: 50 U/L — ABNORMAL HIGH (ref 0–37)
Albumin: 4.4 g/dL (ref 3.5–5.2)
Alkaline Phosphatase: 52 U/L (ref 39–117)
BUN: 11 mg/dL (ref 6–23)
CO2: 30 meq/L (ref 19–32)
Calcium: 9.2 mg/dL (ref 8.4–10.5)
Chloride: 98 meq/L (ref 96–112)
Creatinine, Ser: 0.78 mg/dL (ref 0.40–1.50)
GFR: 102.17 mL/min (ref >60.00)
Glucose, Bld: 63 mg/dL — ABNORMAL LOW (ref 70–99)
Potassium: 3.8 meq/L (ref 3.5–5.1)
Sodium: 140 meq/L (ref 135–145)
Total Bilirubin: 0.8 mg/dL (ref 0.2–1.2)
Total Protein: 6.7 g/dL (ref 6.0–8.3)

## 2023-07-18 ENCOUNTER — Other Ambulatory Visit: Payer: Self-pay | Admitting: Family Medicine

## 2023-07-18 ENCOUNTER — Ambulatory Visit
Admission: RE | Admit: 2023-07-18 | Discharge: 2023-07-18 | Disposition: A | Discharge status: Home / Self Care | Payer: BC Managed Care – PPO | Source: Ambulatory Visit | Attending: Family Medicine | Admitting: Family Medicine

## 2023-07-18 ENCOUNTER — Ambulatory Visit
Admission: RE | Admit: 2023-07-18 | Discharge: 2023-07-18 | Disposition: A | Discharge status: Home / Self Care | Payer: BC Managed Care – PPO | Source: Ambulatory Visit | Attending: Family Medicine

## 2023-07-18 DIAGNOSIS — N6342 Unspecified lump in left breast, subareolar: Secondary | ICD-10-CM

## 2023-07-18 DIAGNOSIS — N62 Hypertrophy of breast: Secondary | ICD-10-CM | POA: Diagnosis not present

## 2023-08-08 ENCOUNTER — Other Ambulatory Visit: Payer: Self-pay

## 2023-08-15 DIAGNOSIS — N521 Erectile dysfunction due to diseases classified elsewhere: Secondary | ICD-10-CM | POA: Diagnosis not present

## 2023-08-15 DIAGNOSIS — E291 Testicular hypofunction: Secondary | ICD-10-CM | POA: Diagnosis not present

## 2023-08-15 DIAGNOSIS — Z6828 Body mass index (BMI) 28.0-28.9, adult: Secondary | ICD-10-CM | POA: Diagnosis not present

## 2023-08-15 DIAGNOSIS — F459 Somatoform disorder, unspecified: Secondary | ICD-10-CM | POA: Diagnosis not present

## 2023-08-22 ENCOUNTER — Telehealth: Payer: Self-pay | Admitting: Family Medicine

## 2023-08-22 DIAGNOSIS — R0789 Other chest pain: Secondary | ICD-10-CM | POA: Diagnosis not present

## 2023-08-22 DIAGNOSIS — I10 Essential (primary) hypertension: Secondary | ICD-10-CM | POA: Diagnosis not present

## 2023-08-22 DIAGNOSIS — I4891 Unspecified atrial fibrillation: Secondary | ICD-10-CM | POA: Diagnosis not present

## 2023-08-22 DIAGNOSIS — R0602 Shortness of breath: Secondary | ICD-10-CM | POA: Diagnosis not present

## 2023-08-22 DIAGNOSIS — R079 Chest pain, unspecified: Secondary | ICD-10-CM | POA: Diagnosis not present

## 2023-08-22 DIAGNOSIS — R42 Dizziness and giddiness: Secondary | ICD-10-CM | POA: Diagnosis not present

## 2023-08-22 DIAGNOSIS — I482 Chronic atrial fibrillation, unspecified: Secondary | ICD-10-CM | POA: Diagnosis not present

## 2023-08-22 DIAGNOSIS — R002 Palpitations: Secondary | ICD-10-CM | POA: Diagnosis not present

## 2023-08-22 DIAGNOSIS — I700 Atherosclerosis of aorta: Secondary | ICD-10-CM | POA: Diagnosis not present

## 2023-08-22 DIAGNOSIS — R000 Tachycardia, unspecified: Secondary | ICD-10-CM | POA: Diagnosis not present

## 2023-08-22 NOTE — Telephone Encounter (Signed)
Yes, Camnitz is good so if he can see him soon I would recommend getting in with him again. They are all the same group, but he could always ask to switch within the practice.

## 2023-08-22 NOTE — Telephone Encounter (Signed)
Patient made aware. Will made appt.

## 2023-08-22 NOTE — Telephone Encounter (Signed)
Pt called to advise that he went to ED and they advised him to follow up with his cardiologist. Pt is not thrilled with provider but will continue to go to him if PCP thinks he should stay with him. Pt will make appt with cardiologist since he will need to be evaluated sooner rather than later but would like a call or mychart message to advise what she thinks.

## 2023-09-01 DIAGNOSIS — N521 Erectile dysfunction due to diseases classified elsewhere: Secondary | ICD-10-CM | POA: Diagnosis not present

## 2023-09-01 DIAGNOSIS — F459 Somatoform disorder, unspecified: Secondary | ICD-10-CM | POA: Diagnosis not present

## 2023-09-01 DIAGNOSIS — Z6829 Body mass index (BMI) 29.0-29.9, adult: Secondary | ICD-10-CM | POA: Diagnosis not present

## 2023-09-01 DIAGNOSIS — E291 Testicular hypofunction: Secondary | ICD-10-CM | POA: Diagnosis not present

## 2023-09-03 ENCOUNTER — Other Ambulatory Visit: Payer: Self-pay

## 2023-09-04 ENCOUNTER — Ambulatory Visit: Payer: BC Managed Care – PPO | Attending: Cardiology | Admitting: Cardiology

## 2023-09-04 ENCOUNTER — Encounter: Payer: Self-pay | Admitting: Cardiology

## 2023-09-04 VITALS — BP 140/80 | HR 90 | Ht 74.0 in | Wt 223.0 lb

## 2023-09-04 DIAGNOSIS — I480 Paroxysmal atrial fibrillation: Secondary | ICD-10-CM | POA: Diagnosis not present

## 2023-09-04 DIAGNOSIS — I10 Essential (primary) hypertension: Secondary | ICD-10-CM

## 2023-09-04 NOTE — Patient Instructions (Signed)

## 2023-09-04 NOTE — Addendum Note (Signed)
Addended by: Zlaty Alexa, Elmarie Shiley L on: 09/04/2023 09:49 AM   Modules accepted: Orders

## 2023-09-04 NOTE — Progress Notes (Signed)
Cardiology Office Note:    Date:  09/04/2023   ID:  Gilbert [name], DOB 04/04/1971, MRN [id number]  PCP:  Clayborne Dana, NP  Cardiologist:  Garwin Brothers, MD   Referring MD: Clayborne Dana, NP    ASSESSMENT:    1. Essential hypertension   2. PAF (paroxysmal atrial fibrillation) (HCC)    PLAN:    In order of problems listed above:  Primary prevention stressed with the patient.  Importance of compliance with diet medication stressed and patient verbalized standing. Paroxysmal atrial fibrillation: This episode has happened after a significant period of time.  Patient tells me that he has been evaluated for sleep apnea in the past and it was negative.  He went to the emergency room.  I reviewed those records.  They have switched him to calcium channel blocker and I agree with this.  His CHA2DS2-VASc score is 1.  I told him that I would send him to Dr. Elberta Fortis for follow-up.  He may be a candidate for atrial fibrillation ablation.  In which case he will need to be on anticoagulation but I am not going to initiate that at this time unless he has a detailed discussion with Dr. Elberta Fortis.  Patient is on calcium channel blocker and doing well.  His follow-up blood pressure after the clinic was 140/78. Essential hypertension: Blood pressure is elevated.  He is anxious today.  He mentions to me that his blood pressures are much better at home.   Medication Adjustments/Labs and Tests Ordered: Current medicines are reviewed at length with the patient today.  Concerns regarding medicines are outlined above.  No orders of the defined types were placed in this encounter.  No orders of the defined types were placed in this encounter.    No chief complaint on file.    History of Present Illness:    Gilbert [name] is a 52 y.o. male.  Patient has past medical history of paroxysmal atrial fibrillation and essential hypertension.  He denies any problems at this time and takes care of activities  of daily living.  No chest pain orthopnea or PND.  He was exercising extensively and felt much better and had this episode when he was under stress at work.  At the time of my evaluation, the patient is alert awake oriented and in no distress.  He has seen Dr. Elberta Fortis her electrophysiologist in the past.  Past Medical History:  Diagnosis Date   ACHILLES TENDINITIS 12/28/2008   Qualifier: Diagnosis of  By: Beverely Low MD, [name]     Actinic keratosis 03/14/2022   Acute appendicitis 07/31/2018   Acute frontal sinusitis 09/01/2008   Qualifier: Diagnosis of  By: Janit Bern     Alcohol use 09/20/2018   Allergic rhinitis 09/16/2014   Anal inflammation 03/08/2018   Asthma 09/13/2013   Backache 01/17/2007   Qualifier: Diagnosis of   By: Blossom Hoops MD, [name]      IMO SNOMED Dx Update Oct 2024     Basal cell carcinoma (BCC) of left shoulder 03/14/2022   Basal cell carcinoma of face 03/14/2022   Basal cell carcinoma of right popliteal region 03/14/2022   CHEST PAIN 06/29/2009   Qualifier: Diagnosis of  By: Beverely Low MD, [facility] WALL PAIN, ANTERIOR 09/22/2010   Qualifier: Diagnosis of  By: Beverely Low MD, [name]     Chronic pain of right ankle 09/20/2018   Decreased libido 12/14/2022   occasional ED.     Dyspnea  on exertion 03/16/2022   Dysthymia 11/21/2018   Ectopic atrial rhythm 03/16/2022   Elevated blood sugar 09/20/2018   Elevated liver enzymes 07/19/2020   Essential hypertension 01/17/2007   Qualifier: Diagnosis of  By: Blossom Hoops MD, [name]     External hemorrhoids 03/31/2008   Qualifier: Diagnosis of  By: Drue Novel MD, Nolon Rod.    GASTROENTERITIS 06/29/2009   Qualifier: Diagnosis of  By: Beverely Low MD, [facility] medical examination 11/21/2011   GERD 06/29/2009   Qualifier: Diagnosis of  By: Beverely Low MD, [facility]     Healthcare maintenance 09/10/2017   Hemangioma of skin and subcutaneous tissue 03/14/2022   Hepatitis    unknown type many yrs ago - no residual problems    History of malignant neoplasm of skin 03/14/2022   Insomnia    Lapband APL + HH repair July 2013 04/24/2012   Left arm numbness 02/05/2014   Leg wound, left 09/16/2014   Lentigo 03/14/2022   Low testosterone 09/10/2017   Lump in neck 04/22/2014   Medically noncompliant 07/16/2020   Melanocytic nevi of left upper limb, including shoulder 03/14/2022   Melanocytic nevi of trunk 03/14/2022   Milia 03/14/2022   Morbid obesity (HCC)    Neoplasm of uncertain behavior of skin 03/14/2022   Night sweat 11/21/2018   OBESITY 03/31/2008   Qualifier: Diagnosis of  By: Drue Novel MD, [name]    Obesity (BMI 30.0-34.9) 03/16/2022   Ocular rosacea    Other seborrheic keratosis 03/14/2022   Other skin changes due to chronic exposure to nonionizing radiation 03/14/2022   Palpitations 03/16/2022   Post-nasal drip 07/16/2020   Pruritus ani 11/21/2018   Pseudofolliculitis barbae 03/14/2022   Vasomotor rhinitis 11/21/2018   Viral warts 03/14/2022   Vitamin D deficiency 09/23/2018    Past Surgical History:  Procedure Laterality Date   ANKLE ARTHROSCOPY WITH RECONSTRUCTION Right 09/26/2018   Procedure: ANKLE DEBRIDEMENT WITH POSSIBLE LATERAL LIGAMENT  RECONSTRUCTION;  Surgeon: Terance Hart, MD;  Location: [facility] OR;  Service: Orthopedics;  Laterality: Right;   HIATAL HERNIA REPAIR  04/23/2012   Procedure: HERNIA REPAIR HIATAL;  Surgeon: Valarie Merino, MD;  Location: [facility] ORS;  Service: General;  Laterality: N/A;   KNEE SURGERY Right    fracture knee cap   LAPAROSCOPIC APPENDECTOMY N/A 07/31/2018   Procedure: APPENDECTOMY LAPAROSCOPIC;  Surgeon: Griselda Miner, MD;  Location: [facility] ORS;  Service: General;  Laterality: N/A;   LAPAROSCOPIC GASTRIC BANDING  04/23/2012   Procedure: LAPAROSCOPIC GASTRIC BANDING;  Surgeon: Valarie Merino, MD;  Location: [facility] ORS;  Service: General;  Laterality: N/A;   LASIK      Current Medications: Current Meds  Medication Sig   diltiazem (DILACOR XR) 120 MG 24 hr capsule  Take 120 mg by mouth daily.   hydrocortisone (ANUSOL-HC) 25 MG suppository Unwrap and place 1 suppository (25 mg total) rectally 2 (two) times daily as needed for hemorrhoids or anal itching.   levocetirizine (XYZAL) 5 MG tablet Take 5 mg by mouth every evening.   sildenafil (VIAGRA) 100 MG tablet Take 0.5-1 tablets (50-100 mg total) by mouth daily as needed for erectile dysfunction.     Allergies:   Penicillins   Social History   Socioeconomic History   Marital status: Married    Spouse name: Not on file   Number of children: Not on file   Years of education: Not on file   Highest education level: Not on file  Occupational History   Not on  file  Tobacco Use   Smoking status: Never   Smokeless tobacco: Never  Vaping Use   Vaping status: Never Used  Substance and Sexual Activity   Alcohol use: Yes    Alcohol/week: 5.0 standard drinks of alcohol    Types: 5 Shots of liquor per week    Comment: social   Drug use: No   Sexual activity: Not on file  Other Topics Concern   Not on file  Social History Narrative   Not on file   Social Determinants of Health   Financial Resource Strain: Not on file  Food Insecurity: Low Risk  (06/13/2023)   Received from [facility]   Hunger Vital Sign    Worried About Running Out of Food in the Last Year: Never true    Ran Out of Food in the Last Year: Never true  Transportation Needs: No Transportation Needs (06/13/2023)   Received from [facility]    In the past 12 months, has lack of reliable transportation kept you from medical appointments, meetings, work or from getting things needed for daily living? : No  Physical Activity: Not on file  Stress: Not on file  Social Connections: Unknown (02/06/2022)   Received from [facility]   Social Network    Social Network: Not on file     Family History: The patient's family history includes Diabetes in his father; Heart disease in his father; Hypertension  in his father. There is no history of Colon cancer, Colon polyps, Esophageal cancer, Rectal cancer, or Stomach cancer.  ROS:   Please see the history of present illness.    All other systems reviewed and are negative.  EKGs/Labs/Other Studies Reviewed:    The following studies were reviewed today: I reviewed EKGs from the past.   Recent Labs: 12/14/2022: Hemoglobin 13.7; Platelets 234.0; TSH 1.22 07/12/2023: ALT 33; BUN 11; Creatinine, Ser 0.78; Potassium 3.8; Sodium 140  Recent Lipid Panel    Component Value Date/Time   CHOL 230 (H) 12/14/2022 0857   TRIG 47.0 12/14/2022 0857   HDL 150.70 12/14/2022 0857   CHOLHDL 2 12/14/2022 0857   VLDL 9.4 12/14/2022 0857   LDLCALC 70 12/14/2022 0857    Physical Exam:    VS:  BP (!) 150/78   Pulse 90   Ht 6\' 2"  (1.88 m)   Wt 223 lb (101.2 kg)   SpO2 98%   BMI 28.63 kg/m     Wt Readings from Last 3 Encounters:  09/04/23 223 lb (101.2 kg)  07/02/23 218 lb 3.2 oz (99 kg)  06/15/23 217 lb (98.4 kg)     GEN: Patient is in no acute distress HEENT: Normal NECK: No JVD; No carotid bruits LYMPHATICS: No lymphadenopathy CARDIAC: Hear sounds regular, 2/6 systolic murmur at the apex. RESPIRATORY:  Clear to auscultation without rales, wheezing or rhonchi  ABDOMEN: Soft, non-tender, non-distended MUSCULOSKELETAL:  No edema; No deformity  SKIN: Warm and dry NEUROLOGIC:  Alert and oriented x 3 PSYCHIATRIC:  Normal affect   Signed, Garwin Brothers, MD  09/04/2023 9:37 AM    [facility]

## 2023-09-11 ENCOUNTER — Other Ambulatory Visit (HOSPITAL_BASED_OUTPATIENT_CLINIC_OR_DEPARTMENT_OTHER): Payer: Self-pay

## 2023-09-12 ENCOUNTER — Other Ambulatory Visit (HOSPITAL_BASED_OUTPATIENT_CLINIC_OR_DEPARTMENT_OTHER): Payer: Self-pay

## 2023-09-12 ENCOUNTER — Other Ambulatory Visit: Payer: Self-pay | Admitting: Family Medicine

## 2023-09-12 DIAGNOSIS — I480 Paroxysmal atrial fibrillation: Secondary | ICD-10-CM

## 2023-09-12 MED ORDER — DILTIAZEM HCL ER 120 MG PO CP24
120.0000 mg | ORAL_CAPSULE | Freq: Every day | ORAL | 0 refills | Status: DC
  Filled 2023-09-12 – 2023-09-17 (×2): qty 90, 90d supply, fill #0

## 2023-09-17 ENCOUNTER — Other Ambulatory Visit (HOSPITAL_BASED_OUTPATIENT_CLINIC_OR_DEPARTMENT_OTHER): Payer: Self-pay

## 2023-09-28 ENCOUNTER — Ambulatory Visit: Payer: BC Managed Care – PPO | Admitting: Family Medicine

## 2023-09-28 ENCOUNTER — Other Ambulatory Visit (HOSPITAL_BASED_OUTPATIENT_CLINIC_OR_DEPARTMENT_OTHER): Payer: Self-pay

## 2023-09-28 ENCOUNTER — Encounter: Payer: Self-pay | Admitting: Family Medicine

## 2023-09-28 VITALS — BP 128/81 | HR 85 | Temp 98.2°F | Ht 74.0 in | Wt 220.0 lb

## 2023-09-28 DIAGNOSIS — J0110 Acute frontal sinusitis, unspecified: Secondary | ICD-10-CM | POA: Diagnosis not present

## 2023-09-28 DIAGNOSIS — G4700 Insomnia, unspecified: Secondary | ICD-10-CM | POA: Diagnosis not present

## 2023-09-28 DIAGNOSIS — K644 Residual hemorrhoidal skin tags: Secondary | ICD-10-CM

## 2023-09-28 MED ORDER — HYDROCORTISONE ACETATE 25 MG RE SUPP
25.0000 mg | Freq: Two times a day (BID) | RECTAL | 1 refills | Status: DC | PRN
  Filled 2023-09-28: qty 12, 6d supply, fill #0
  Filled 2024-04-10: qty 12, 6d supply, fill #1

## 2023-09-28 MED ORDER — ZOLPIDEM TARTRATE 5 MG PO TABS
5.0000 mg | ORAL_TABLET | Freq: Every evening | ORAL | 0 refills | Status: DC | PRN
  Filled 2023-09-28: qty 15, 15d supply, fill #0

## 2023-09-28 MED ORDER — AZITHROMYCIN 250 MG PO TABS
ORAL_TABLET | ORAL | 0 refills | Status: AC
  Filled 2023-09-28: qty 6, 5d supply, fill #0

## 2023-09-28 NOTE — Progress Notes (Signed)
 Acute Office Visit  Subjective:     Patient ID: Gilbert [name], male    DOB: [birth year], 53 y.o.   MRN: [id number]  Chief Complaint  Patient presents with   Sinus Problem     Patient is in today for sinusitis.   Discussed the use of AI scribe software for clinical note transcription with the patient, who gave verbal consent to proceed.  History of Present Illness   The patient, with a history of atrial fibrillation and alcohol use, presents with a prolonged upper respiratory infection. They report attending a trade show in [place], after which they developed symptoms of hoarseness, throat pain, and fever lasting for two days. The patient also reports feeling congested and experiencing shortness of breath. They note some improvement in symptoms but still feel unwell. They also report nasal bleeding, which has since resolved. The patient has been self-medicating with over-the-counter cold remedies. Symptoms have persisted for over 3 weeks now.  The patient also reports a recent diagnosis of atrial fibrillation, for which they were started on diltiazem . They have seen general cardiology and are awaiting an appointment with an electrophysiologist in a few weeks. They express a desire to return to their regular gym routine, which has been limited due to their cardiac condition.  The patient also discusses their ongoing struggle with alcohol use. They report an increase in alcohol consumption since their separation and express a desire to quit. They have joined a virtual support group and are using an app for accountability. They report experiencing mild withdrawal symptoms, including insomnia and tremors, but no severe symptoms. They have also started a dry January challenge in an attempt to reduce their alcohol intake - currently on day 3 and doing fairly well.   The patient also reports occasional issues with hemorrhoids, for which they use hydrocortisone  suppositories as needed. They  deny any current discomfort or bleeding. They also report a history of tinnitus and hearing loss, for which they have seen an ENT specialist. They deny any current ear pain but report recent excessive ear wax.             ROS All review of systems negative except what is listed in the HPI      Objective:    BP 128/81   Pulse 85   Temp 98.2 F (36.8 C) (Oral)   Ht 6' 2 (1.88 m)   Wt 220 lb (99.8 kg)   SpO2 100%   BMI 28.25 kg/m    Physical Exam Vitals reviewed.  Constitutional:      General: He is not in acute distress.    Appearance: Normal appearance. He is not ill-appearing.  HENT:     Right Ear: A middle ear effusion is present.     Left Ear: A middle ear effusion is present.     Nose: Congestion and rhinorrhea present.     Mouth/Throat:     Mouth: Mucous membranes are moist.     Pharynx: Oropharynx is clear.  Eyes:     Conjunctiva/sclera: Conjunctivae normal.  Cardiovascular:     Rate and Rhythm: Normal rate and regular rhythm.  Pulmonary:     Effort: Pulmonary effort is normal.     Breath sounds: Normal breath sounds. No wheezing, rhonchi or rales.  Genitourinary:    Comments: Deferred  Skin:    General: Skin is warm and dry.  Neurological:     Mental Status: He is alert and oriented to person, place, and time.  Psychiatric:        Behavior: Behavior normal.         No results found for any visits on 09/28/23.      Assessment & Plan:   Problem List Items Addressed This Visit       Active Problems   External hemorrhoids   Relevant Medications   hydrocortisone  (ANUSOL -HC) 25 MG suppository   Acute frontal sinusitis - Primary   Relevant Medications   azithromycin  (ZITHROMAX ) 250 MG tablet   Insomnia   Relevant Medications   zolpidem  (AMBIEN ) 5 MG tablet       Upper Respiratory Infection Persistent symptoms for 2-3 weeks including congestion, hoarseness, throat pain, and fever. Some improvement noted with over-the-counter  medications, but still symptomatic. -Prescribe Azithromycin  (Z-Pak)  -Advise to continue with over-the-counter medications as needed. -Continue supportive measures including rest, hydration, humidifier use, steam showers, warm compresses to sinuses, warm liquids with lemon and honey, and over-the-counter cough, cold, and analgesics as needed.    Alcohol Use Patient is attempting to reduce alcohol intake and is experiencing difficulty sleeping and mild tremors. Patient is using a support group and an app for accountability. -Prescribe a short course of Ambien  PRN for sleep. -Advise patient to seek immediate medical attention if severe withdrawal symptoms occur (e.g., significant tremors, hallucinations). -Provided handout on community clinics for alcohol cessation programs.  Hemorrhoids Intermittent itching, no current bleeding. -Prescribe Hydrocortisone  suppositories as needed.        Meds ordered this encounter  Medications   zolpidem  (AMBIEN ) 5 MG tablet    Sig: Take 1 tablet (5 mg total) by mouth at bedtime as needed for sleep.    Dispense:  15 tablet    Refill:  0    Supervising Provider:   DOMENICA BLACKBIRD A [[id number]]   hydrocortisone  (ANUSOL -HC) 25 MG suppository    Sig: Unwrap and place 1 suppository (25 mg total) rectally 2 (two) times daily as needed for hemorrhoids or anal itching.    Dispense:  12 suppository    Refill:  1    Supervising Provider:   DOMENICA BLACKBIRD A [[id number]]   azithromycin  (ZITHROMAX ) 250 MG tablet    Sig: Take 2 tablets (500 mg total) daily for 1 day, THEN 1 tablet (250 mg total) daily for 4 days.    Dispense:  6 tablet    Refill:  0    Supervising Provider:   DOMENICA BLACKBIRD A [[id number]]    Return if symptoms worsen or fail to improve.  Gilbert [name] Mon, NP

## 2023-10-16 ENCOUNTER — Other Ambulatory Visit (HOSPITAL_BASED_OUTPATIENT_CLINIC_OR_DEPARTMENT_OTHER): Payer: Self-pay

## 2023-10-16 ENCOUNTER — Ambulatory Visit: Payer: BC Managed Care – PPO | Attending: Cardiology | Admitting: Cardiology

## 2023-10-16 ENCOUNTER — Encounter: Payer: Self-pay | Admitting: Cardiology

## 2023-10-16 VITALS — BP 136/84 | HR 56 | Ht 74.0 in | Wt 227.0 lb

## 2023-10-16 DIAGNOSIS — I483 Typical atrial flutter: Secondary | ICD-10-CM

## 2023-10-16 DIAGNOSIS — Z01812 Encounter for preprocedural laboratory examination: Secondary | ICD-10-CM | POA: Diagnosis not present

## 2023-10-16 DIAGNOSIS — I480 Paroxysmal atrial fibrillation: Secondary | ICD-10-CM | POA: Diagnosis not present

## 2023-10-16 DIAGNOSIS — I10 Essential (primary) hypertension: Secondary | ICD-10-CM

## 2023-10-16 MED ORDER — FLECAINIDE ACETATE 50 MG PO TABS
50.0000 mg | ORAL_TABLET | Freq: Two times a day (BID) | ORAL | 3 refills | Status: DC
  Filled 2023-10-16: qty 180, 90d supply, fill #0
  Filled 2024-01-09: qty 180, 90d supply, fill #1

## 2023-10-16 MED ORDER — APIXABAN 5 MG PO TABS
5.0000 mg | ORAL_TABLET | Freq: Two times a day (BID) | ORAL | 3 refills | Status: DC
  Filled 2023-10-16 – 2023-12-10 (×2): qty 180, 90d supply, fill #0
  Filled 2024-03-07: qty 180, 90d supply, fill #1

## 2023-10-16 MED ORDER — METOPROLOL TARTRATE 25 MG PO TABS
25.0000 mg | ORAL_TABLET | ORAL | 0 refills | Status: DC
  Filled 2023-10-16: qty 1, 1d supply, fill #0

## 2023-10-16 NOTE — Patient Instructions (Addendum)
Medication Instructions:  Your physician has recommended you make the following change in your medication:   ** Begin Flecainide 50mg  - 1 tablet by mouth twice daily  ** Eliquis 5mg  - 1 tablet twice daily - You will begin this medication 3 weeks prior to your scheduled ablation.  Start medication on Tuesday, April 24,2025.   *If you need a refill on your cardiac medications before your next appointment, please call your pharmacy*   Lab Work: None ordered.  If you have labs (blood work) drawn today and your tests are completely normal, you will receive your results only by: MyChart Message (if you have MyChart) OR A paper copy in the mail If you have any lab test that is abnormal or we need to change your treatment, we will call you to review the results.   Testing/Procedures:  Tentatively scheduled for Thursday, May 15,2025 Your physician has recommended that you have an ablation. Catheter ablation is a medical procedure used to treat some cardiac arrhythmias (irregular heartbeats). During catheter ablation, a long, thin, flexible tube is put into a blood vessel in your groin (upper thigh), or neck. This tube is called an ablation catheter. It is then guided to your heart through the blood vessel. Radio frequency waves destroy small areas of heart tissue where abnormal heartbeats may cause an arrhythmia to start. Please see the instruction sheet given to you today.     Follow-Up: At [facility], you and your health needs are our priority.  As part of our continuing mission to provide you with exceptional heart care, we have created designated Provider Care Teams.  These Care Teams include your primary Cardiologist (physician) and Advanced Practice Providers (APPs -  Physician Assistants and Nurse Practitioners) who all work together to provide you with the care you need, when you need it.  We recommend signing up for the patient portal called "MyChart".  Sign up information is  provided on this After Visit Summary.  MyChart is used to connect with patients for Virtual Visits (Telemedicine).  Patients are able to view lab/test results, encounter notes, upcoming appointments, etc.  Non-urgent messages can be sent to your provider as well.   To learn more about what you can do with MyChart, go to [email or web address].    Your next appointment:   To be scheduled  Cardiac Ablation Cardiac ablation is a procedure to destroy, or ablate, a small amount of heart tissue that is causing problems. The heart has many electrical connections. Sometimes, these connections are abnormal and can cause the heart to beat very fast or irregularly. Ablating the abnormal areas can improve the heart's rhythm or return it to normal. Ablation may be done for people who: Have irregular or rapid heartbeats (arrhythmias). Have Wolff-Parkinson-White syndrome. Have taken medicines for an arrhythmia that did not work or caused side effects. Have a high-risk heartbeat that may be life-threatening. Tell a health care provider about: Any allergies you have. All medicines you are taking, including vitamins, herbs, eye drops, creams, and over-the-counter medicines. Any problems you or family members have had with anesthesia. Any bleeding problems you have. Any surgeries you have had. Any medical conditions you have. Whether you are pregnant or may be pregnant. What are the risks? Your health care provider will talk with you about risks. These may include: Infection. Bruising and bleeding. Stroke or blood clots. Damage to nearby structures or organs. Allergic reaction to medicines or dyes. Needing a pacemaker if the heart gets damaged. A pacemaker  is a device that helps the heart beat normally. Failure of the procedure. A repeat procedure may be needed. What happens before the procedure? Medicines Ask your health care provider about: Changing or stopping your regular medicines. These include  any heart rhythm medicines, diabetes medicines, or blood thinners you take. Taking medicines such as aspirin and ibuprofen. These medicines can thin your blood. Do not take them unless your health care provider tells you to. Taking over-the-counter medicines, vitamins, herbs, and supplements. General instructions Follow instructions from your health care provider about what you may eat and drink. If you will be going home right after the procedure, plan to have a responsible adult: Take you home from the hospital or clinic. You will not be allowed to drive. Care for you for the time you are told. Ask your health care provider what steps will be taken to prevent infection. What happens during the procedure?  An IV will be inserted into one of your veins. You may be given: A sedative. This helps you relax. Anesthesia. This will: Numb certain areas of your body. An incision will be made in your neck or your groin. A needle will be inserted through the incision and into a large vein in your neck or groin. The small, thin tube (catheter) will be inserted through the needle and moved to your heart. A type of X-ray (fluoroscopy) will be used to help guide the catheter and provide images of the heart on a monitor. Dye may be injected through the catheter to help your surgeon see the area of the heart that needs treatment. Electrical currents will be sent from the catheter to destroy heart tissue in certain areas. There are three types of energy that may be used to do this: Heat (radiofrequency energy). Laser energy. Extreme cold (cryoablation). When the tissue has been destroyed, the catheter will be removed. Pressure will be held on the insertion area to prevent bleeding. A bandage (dressing) will be placed over the insertion area. The procedure may vary among health care providers and hospitals. What happens after the procedure? Your blood pressure, heart rate and rhythm, breathing rate, and  blood oxygen level will be monitored until you leave the hospital or clinic. Your insertion area will be checked for bleeding. You will need to lie still for a few hours. If your groin was used, you will need to keep your leg straight for a few hours after the catheter is removed. This information is not intended to replace advice given to you by your health care provider. Make sure you discuss any questions you have with your health care provider. Document Revised: 02/28/2022 Document Reviewed: 02/28/2022 Elsevier Patient Education  2024 [employer].

## 2023-10-16 NOTE — Addendum Note (Signed)
Addended by: Alois Cliche on: 10/16/2023 10:36 AM   Modules accepted: Orders

## 2023-10-16 NOTE — Progress Notes (Signed)
Electrophysiology Office Note:   Date:  10/16/2023  ID:  Gilbert [name], DOB 1971/03/24, MRN [id number]  Primary Cardiologist: Garwin Brothers, MD Primary Heart Failure: None Electrophysiologist: Adorian Gwynne Jorja Loa, MD      History of Present Illness:   Gilbert [name] is a 53 y.o. male with h/o hypertension, obesity, atrial fibrillation seen today for routine electrophysiology followup.   Since last being seen in our clinic the patient reports doing overall well.  He has been feeling okay since Thanksgiving.  Around Thanksgiving, he presented to the emergency room at [facility] with palpitations, weakness, fatigue.  He was found to be in atrial flutter.  He received IV fluids, and apparently converted back to sinus rhythm.  He was started on diltiazem.  He has not done well since that time.  he denies chest pain, palpitations, dyspnea, PND, orthopnea, nausea, vomiting, dizziness, syncope, edema, weight gain, or early satiety.   Review of systems complete and found to be negative unless listed in HPI.   EP Information / Studies Reviewed:    EKG is ordered today. Personal review as below.  EKG Interpretation Date/Time:  Tuesday October 16 2023 08:12:23 EST Ventricular Rate:  56 PR Interval:  132 QRS Duration:  90 QT Interval:  412 QTC Calculation: 397 R Axis:   67  Text Interpretation: Sinus bradycardia When compared with ECG of 13-Mar-2022 16:23, Sinus rhythm Confirmed by Sadarius Norman ([id number]) on 10/16/2023 8:18:41 AM     Risk Assessment/Calculations:    CHA2DS2-VASc Score = 1   This indicates a 0.6% annual risk of stroke. The patient's score is based upon: CHF History: 0 HTN History: 1 Diabetes History: 0 Stroke History: 0 Vascular Disease History: 0 Age Score: 0 Gender Score: 0              Physical Exam:   VS:  BP 136/84 (BP Location: Left Arm, Patient Position: Sitting, Cuff Size: Large)   Pulse (!) 56   Ht 6\' 2"  (1.88 m)   Wt 227 lb (103 kg)   SpO2 98%    BMI 29.15 kg/m    Wt Readings from Last 3 Encounters:  10/16/23 227 lb (103 kg)  09/28/23 220 lb (99.8 kg)  09/04/23 223 lb (101.2 kg)     GEN: Well nourished, well developed in no acute distress NECK: No JVD; No carotid bruits CARDIAC: Regular rate and rhythm, no murmurs, rubs, gallops RESPIRATORY:  Clear to auscultation without rales, wheezing or rhonchi  ABDOMEN: Soft, non-tender, non-distended EXTREMITIES:  No edema; No deformity   ASSESSMENT AND PLAN:    1.  Paroxysmal atrial fibrillation/flutter: Currently not anticoagulated.  Wore a cardiac monitor with short episodes of atrial fibrillation.  He did present to an outside emergency room with apparently atrial flutter.  Marrissa Dai get EKGs from that visit.  He would benefit from further rhythm control.  He would prefer to avoid long-term antiarrhythmics.  Due to that, we Oreste Majeed plan for ablation.  Risk and benefits have been discussed.  He understands the risks and is agreed to the procedure.  In the interim we Marianela Mandrell start 50 mg of flecainide.  Risk, benefits, and alternatives to EP study and radiofrequency/pulse field ablation for afib were also discussed in detail today. These risks include but are not limited to stroke, bleeding, vascular damage, tamponade, perforation, damage to the esophagus, lungs, and other structures, pulmonary vein stenosis, worsening renal function, and death. The patient understands these risk and wishes to proceed.  We Jamarl Pew therefore  proceed with catheter ablation at the next available time.  Carto, ICE, anesthesia are requested for the procedure.  Valeri Sula also obtain CT PV protocol prior to the procedure to exclude LAA thrombus and further evaluate atrial anatomy.  2.  Hypertension: Currently well-controlled  Follow up with Dr. Elberta Fortis as usual post procedure  Signed, Hebah Bogosian Jorja Loa, MD

## 2023-10-17 ENCOUNTER — Other Ambulatory Visit: Payer: Self-pay

## 2023-10-17 ENCOUNTER — Other Ambulatory Visit (HOSPITAL_BASED_OUTPATIENT_CLINIC_OR_DEPARTMENT_OTHER): Payer: Self-pay

## 2023-10-17 DIAGNOSIS — F459 Somatoform disorder, unspecified: Secondary | ICD-10-CM | POA: Diagnosis not present

## 2023-10-17 DIAGNOSIS — N521 Erectile dysfunction due to diseases classified elsewhere: Secondary | ICD-10-CM | POA: Diagnosis not present

## 2023-10-17 DIAGNOSIS — G4700 Insomnia, unspecified: Secondary | ICD-10-CM | POA: Diagnosis not present

## 2023-10-17 DIAGNOSIS — E291 Testicular hypofunction: Secondary | ICD-10-CM | POA: Diagnosis not present

## 2023-10-17 DIAGNOSIS — Z79899 Other long term (current) drug therapy: Secondary | ICD-10-CM | POA: Diagnosis not present

## 2023-10-17 DIAGNOSIS — Z6829 Body mass index (BMI) 29.0-29.9, adult: Secondary | ICD-10-CM | POA: Diagnosis not present

## 2023-10-17 MED ORDER — ESZOPICLONE 2 MG PO TABS
2.0000 mg | ORAL_TABLET | Freq: Every day | ORAL | 0 refills | Status: DC
  Filled 2023-10-17: qty 30, 30d supply, fill #0

## 2023-10-17 MED ORDER — TESTOSTERONE CYPIONATE 200 MG/ML IM SOLN
100.0000 mg | INTRAMUSCULAR | 0 refills | Status: DC
  Filled 2023-10-17: qty 10, 30d supply, fill #0

## 2023-10-18 ENCOUNTER — Other Ambulatory Visit (HOSPITAL_BASED_OUTPATIENT_CLINIC_OR_DEPARTMENT_OTHER): Payer: Self-pay

## 2023-10-19 DIAGNOSIS — E291 Testicular hypofunction: Secondary | ICD-10-CM | POA: Diagnosis not present

## 2023-10-19 DIAGNOSIS — Z79899 Other long term (current) drug therapy: Secondary | ICD-10-CM | POA: Diagnosis not present

## 2023-10-25 DIAGNOSIS — I480 Paroxysmal atrial fibrillation: Secondary | ICD-10-CM | POA: Diagnosis not present

## 2023-10-26 LAB — CBC
Hematocrit: 38.5 % (ref 37.5–51.0)
Hemoglobin: 13.5 g/dL (ref 13.0–17.7)
MCH: 34.1 pg — ABNORMAL HIGH (ref 26.6–33.0)
MCHC: 35.1 g/dL (ref 31.5–35.7)
MCV: 97 fL (ref 79–97)
Platelets: 251 10*3/uL (ref 150–450)
RBC: 3.96 x10E6/uL — ABNORMAL LOW (ref 4.14–5.80)
RDW: 10.9 % — ABNORMAL LOW (ref 11.6–15.4)
WBC: 7 10*3/uL (ref 3.4–10.8)

## 2023-10-26 LAB — BASIC METABOLIC PANEL
BUN/Creatinine Ratio: 11 (ref 9–20)
BUN: 11 mg/dL (ref 6–24)
CO2: 25 mmol/L (ref 20–29)
Calcium: 9.3 mg/dL (ref 8.7–10.2)
Chloride: 101 mmol/L (ref 96–106)
Creatinine, Ser: 0.96 mg/dL (ref 0.76–1.27)
Glucose: 105 mg/dL — ABNORMAL HIGH (ref 70–99)
Potassium: 4.3 mmol/L (ref 3.5–5.2)
Sodium: 140 mmol/L (ref 134–144)
eGFR: 95 mL/min/{1.73_m2} (ref >59)

## 2023-11-02 DIAGNOSIS — E291 Testicular hypofunction: Secondary | ICD-10-CM | POA: Diagnosis not present

## 2023-11-14 DIAGNOSIS — E559 Vitamin D deficiency, unspecified: Secondary | ICD-10-CM | POA: Diagnosis not present

## 2023-11-14 DIAGNOSIS — R5383 Other fatigue: Secondary | ICD-10-CM | POA: Diagnosis not present

## 2023-11-14 DIAGNOSIS — E039 Hypothyroidism, unspecified: Secondary | ICD-10-CM | POA: Diagnosis not present

## 2023-11-14 DIAGNOSIS — E079 Disorder of thyroid, unspecified: Secondary | ICD-10-CM | POA: Diagnosis not present

## 2023-11-14 DIAGNOSIS — E291 Testicular hypofunction: Secondary | ICD-10-CM | POA: Diagnosis not present

## 2023-11-14 LAB — LIPID PANEL
Cholesterol: 182 (ref 0–200)
HDL: 52 (ref 35–70)
LDL Cholesterol: 118
Triglycerides: 66 (ref 40–160)

## 2023-11-14 LAB — HEPATIC FUNCTION PANEL
ALT: 12 U/L (ref 10–40)
AST: 16 (ref 14–40)
Alkaline Phosphatase: 75 (ref 25–125)
Bilirubin, Total: 0.6

## 2023-11-14 LAB — CBC AND DIFFERENTIAL
HCT: 41 (ref 41–53)
Hemoglobin: 14.1 (ref 13.5–17.5)
Platelets: 248 10*3/uL (ref 150–400)
WBC: 6.8

## 2023-11-14 LAB — BASIC METABOLIC PANEL WITH GFR
A1c: 5.1
BUN: 8 (ref 4–21)
CO2: 25 — AB (ref 13–22)
Chloride: 103 (ref 99–108)
Creatinine: 0.9 (ref 0.6–1.3)
EGFR (Non-African Amer.): 101
Free T4: 1.16 ng/dL
Glucose: 75
PSA, Total: 0.5
Potassium: 4.5 meq/L (ref 3.5–5.1)
Sodium: 141 (ref 137–147)
TSH: 1.35 (ref 0.41–5.90)

## 2023-11-14 LAB — VITAMIN D 25 HYDROXY (VIT D DEFICIENCY, FRACTURES): Vit D, 25-Hydroxy: 17.5

## 2023-11-14 LAB — VITAMIN B12: Vitamin B-12: 467

## 2023-11-14 LAB — COMPREHENSIVE METABOLIC PANEL WITH GFR
Albumin: 4.2 (ref 3.5–5.0)
Calcium: 9.6 (ref 8.7–10.7)
Globulin: 2.5
eGFR: 101

## 2023-11-14 LAB — HEMOGLOBIN A1C: Hemoglobin A1C: 5.1

## 2023-11-14 LAB — TSH: TSH: 1.35 (ref 0.41–5.90)

## 2023-11-14 LAB — IRON,TIBC AND FERRITIN PANEL
%SAT: 32
Iron: 95
TIBC: 293
UIBC: 198

## 2023-11-14 LAB — TESTOSTERONE: Testosterone: 745

## 2023-11-14 LAB — CBC: RBC: 4.12 (ref 3.87–5.11)

## 2023-11-15 DIAGNOSIS — E221 Hyperprolactinemia: Secondary | ICD-10-CM | POA: Diagnosis not present

## 2023-11-16 ENCOUNTER — Other Ambulatory Visit (HOSPITAL_BASED_OUTPATIENT_CLINIC_OR_DEPARTMENT_OTHER): Payer: Self-pay

## 2023-11-16 ENCOUNTER — Other Ambulatory Visit: Payer: Self-pay

## 2023-11-16 DIAGNOSIS — G4700 Insomnia, unspecified: Secondary | ICD-10-CM | POA: Diagnosis not present

## 2023-11-16 DIAGNOSIS — Z6828 Body mass index (BMI) 28.0-28.9, adult: Secondary | ICD-10-CM | POA: Diagnosis not present

## 2023-11-16 DIAGNOSIS — N521 Erectile dysfunction due to diseases classified elsewhere: Secondary | ICD-10-CM | POA: Diagnosis not present

## 2023-11-16 DIAGNOSIS — E291 Testicular hypofunction: Secondary | ICD-10-CM | POA: Diagnosis not present

## 2023-11-16 DIAGNOSIS — Z79899 Other long term (current) drug therapy: Secondary | ICD-10-CM | POA: Diagnosis not present

## 2023-11-16 DIAGNOSIS — F459 Somatoform disorder, unspecified: Secondary | ICD-10-CM | POA: Diagnosis not present

## 2023-11-16 MED ORDER — ESZOPICLONE 2 MG PO TABS
2.0000 mg | ORAL_TABLET | Freq: Every evening | ORAL | 0 refills | Status: DC
  Filled 2023-11-16: qty 30, 30d supply, fill #0

## 2023-11-16 MED ORDER — TESTOSTERONE CYPIONATE 200 MG/ML IM SOLN
100.0000 mg | INTRAMUSCULAR | 0 refills | Status: DC
  Filled 2023-11-16: qty 10, 30d supply, fill #0

## 2023-11-19 ENCOUNTER — Other Ambulatory Visit (HOSPITAL_BASED_OUTPATIENT_CLINIC_OR_DEPARTMENT_OTHER): Payer: Self-pay

## 2023-11-20 ENCOUNTER — Other Ambulatory Visit (HOSPITAL_BASED_OUTPATIENT_CLINIC_OR_DEPARTMENT_OTHER): Payer: Self-pay

## 2023-11-20 DIAGNOSIS — Z79899 Other long term (current) drug therapy: Secondary | ICD-10-CM | POA: Diagnosis not present

## 2023-11-22 ENCOUNTER — Other Ambulatory Visit (HOSPITAL_BASED_OUTPATIENT_CLINIC_OR_DEPARTMENT_OTHER): Payer: Self-pay

## 2023-11-27 ENCOUNTER — Other Ambulatory Visit (HOSPITAL_BASED_OUTPATIENT_CLINIC_OR_DEPARTMENT_OTHER): Payer: Self-pay

## 2023-11-28 ENCOUNTER — Other Ambulatory Visit (HOSPITAL_BASED_OUTPATIENT_CLINIC_OR_DEPARTMENT_OTHER): Payer: Self-pay

## 2023-11-29 ENCOUNTER — Other Ambulatory Visit (HOSPITAL_BASED_OUTPATIENT_CLINIC_OR_DEPARTMENT_OTHER): Payer: Self-pay

## 2023-11-30 ENCOUNTER — Other Ambulatory Visit (HOSPITAL_BASED_OUTPATIENT_CLINIC_OR_DEPARTMENT_OTHER): Payer: Self-pay

## 2023-12-03 ENCOUNTER — Other Ambulatory Visit (HOSPITAL_BASED_OUTPATIENT_CLINIC_OR_DEPARTMENT_OTHER): Payer: Self-pay

## 2023-12-04 ENCOUNTER — Telehealth: Payer: Self-pay | Admitting: Cardiology

## 2023-12-04 ENCOUNTER — Other Ambulatory Visit (HOSPITAL_BASED_OUTPATIENT_CLINIC_OR_DEPARTMENT_OTHER): Payer: Self-pay

## 2023-12-04 DIAGNOSIS — I480 Paroxysmal atrial fibrillation: Secondary | ICD-10-CM

## 2023-12-04 NOTE — Telephone Encounter (Signed)
 Spoke with patient and he would like   to see if he can get ablation done sooner than May. He is aware Gilbert [name] is out of office today but will return call once she return

## 2023-12-04 NOTE — Telephone Encounter (Signed)
 Pt is scheduled for an Ablation on 02/07/24 and is requesting a callback from nurse Sherri or April to see about him having it done sooner. Please advise

## 2023-12-05 ENCOUNTER — Other Ambulatory Visit (HOSPITAL_BASED_OUTPATIENT_CLINIC_OR_DEPARTMENT_OTHER): Payer: Self-pay

## 2023-12-05 NOTE — Telephone Encounter (Signed)
 Returned pt's call - I moved his AF Ablation up to 4/11 at 10:00 am with Dr. Elberta Fortis. He could not be moved up sooner due to having to start Eliquis 3 weeks prior to procedure.   CT has been scheduled on 3/25 at 4:00 pm.. pt is aware of Metoprolol being sent in for him to take prior to scan.  Pt will start Eliquis 5mg /bid on 3/21.  Pt will go to [facility] in [place] for updated labs.  Once completed I will send instruction letters to pt via MyChart.

## 2023-12-06 ENCOUNTER — Other Ambulatory Visit (HOSPITAL_BASED_OUTPATIENT_CLINIC_OR_DEPARTMENT_OTHER): Payer: Self-pay

## 2023-12-06 NOTE — Telephone Encounter (Signed)
 Pt aware not to take Lopressor prior to CT based on recent HRs.  Aware they will give him medication via IV if needed.  Patient verbalized understanding and agreeable to plan.

## 2023-12-07 ENCOUNTER — Other Ambulatory Visit (HOSPITAL_BASED_OUTPATIENT_CLINIC_OR_DEPARTMENT_OTHER): Payer: Self-pay

## 2023-12-10 ENCOUNTER — Other Ambulatory Visit (HOSPITAL_BASED_OUTPATIENT_CLINIC_OR_DEPARTMENT_OTHER): Payer: Self-pay

## 2023-12-10 DIAGNOSIS — I480 Paroxysmal atrial fibrillation: Secondary | ICD-10-CM | POA: Diagnosis not present

## 2023-12-11 ENCOUNTER — Other Ambulatory Visit (HOSPITAL_BASED_OUTPATIENT_CLINIC_OR_DEPARTMENT_OTHER): Payer: Self-pay

## 2023-12-11 LAB — BASIC METABOLIC PANEL
BUN/Creatinine Ratio: 11 (ref 9–20)
BUN: 11 mg/dL (ref 6–24)
CO2: 22 mmol/L (ref 20–29)
Calcium: 9.7 mg/dL (ref 8.7–10.2)
Chloride: 102 mmol/L (ref 96–106)
Creatinine, Ser: 0.96 mg/dL (ref 0.76–1.27)
Glucose: 85 mg/dL (ref 70–99)
Potassium: 4.7 mmol/L (ref 3.5–5.2)
Sodium: 142 mmol/L (ref 134–144)
eGFR: 95 mL/min/{1.73_m2} (ref >59)

## 2023-12-11 LAB — CBC
Hematocrit: 40 % (ref 37.5–51.0)
Hemoglobin: 13.7 g/dL (ref 13.0–17.7)
MCH: 33.2 pg — ABNORMAL HIGH (ref 26.6–33.0)
MCHC: 34.3 g/dL (ref 31.5–35.7)
MCV: 97 fL (ref 79–97)
Platelets: 269 10*3/uL (ref 150–450)
RBC: 4.13 x10E6/uL — ABNORMAL LOW (ref 4.14–5.80)
RDW: 11.7 % (ref 11.6–15.4)
WBC: 7.7 10*3/uL (ref 3.4–10.8)

## 2023-12-12 ENCOUNTER — Telehealth (HOSPITAL_COMMUNITY): Payer: Self-pay

## 2023-12-12 ENCOUNTER — Other Ambulatory Visit: Payer: Self-pay

## 2023-12-12 ENCOUNTER — Other Ambulatory Visit (HOSPITAL_BASED_OUTPATIENT_CLINIC_OR_DEPARTMENT_OTHER): Payer: Self-pay

## 2023-12-12 NOTE — Telephone Encounter (Signed)
 Spoke with patient to complete one month pre-procedure call.     New medical conditions? No Recent hospitalizations or surgeries? No Started any new medications? No Patient made aware to contact office to inform of any new medications started. Any changes in activities of daily living?No  Pre-procedure testing scheduled: CT on 12/18/23 and lab work completed. Confirmed patient will start taking Eliquis on 12/14/23 and made aware to continue taking medication before procedure or it may need to be rescheduled.  Confirmed patient is scheduled for Atrial Fibrillation Ablation on Friday, April 11 with Dr. Loman Brooklyn. Instructed patient to arrive at the Main Entrance A at [facility]: [address] [place], [place] [address] and check in at Admitting at 8:00 AM.  Advised of plan to go home the same day and will only stay overnight if medically necessary. You MUST have a responsible adult to drive you home and MUST be with you the first 24 hours after you arrive home or your procedure could be cancelled.  Patient verbalized understanding to information provided and is agreeable to proceed with procedure.

## 2023-12-13 ENCOUNTER — Other Ambulatory Visit (HOSPITAL_BASED_OUTPATIENT_CLINIC_OR_DEPARTMENT_OTHER): Payer: Self-pay

## 2023-12-13 MED ORDER — DILTIAZEM HCL ER COATED BEADS 120 MG PO CP24
120.0000 mg | ORAL_CAPSULE | Freq: Every day | ORAL | 1 refills | Status: DC
  Filled 2023-12-13: qty 90, 90d supply, fill #0
  Filled 2024-03-07: qty 90, 90d supply, fill #1

## 2023-12-13 MED ORDER — TRIAMCINOLONE ACETONIDE 0.5 % EX CREA
1.0000 | TOPICAL_CREAM | Freq: Two times a day (BID) | CUTANEOUS | 1 refills | Status: AC | PRN
  Filled 2023-12-13: qty 15, 8d supply, fill #0

## 2023-12-13 MED ORDER — ESZOPICLONE 2 MG PO TABS
2.0000 mg | ORAL_TABLET | Freq: Every day | ORAL | 0 refills | Status: DC
  Filled 2023-12-13 – 2023-12-14 (×2): qty 90, 90d supply, fill #0

## 2023-12-13 MED ORDER — TESTOSTERONE CYPIONATE 200 MG/ML IM SOLN
100.0000 mg | INTRAMUSCULAR | 0 refills | Status: DC
  Filled 2023-12-13: qty 10, 30d supply, fill #0
  Filled 2023-12-24: qty 4, 28d supply, fill #0
  Filled 2024-01-09 – 2024-01-17 (×2): qty 4, 28d supply, fill #1
  Filled 2024-02-15: qty 2, 14d supply, fill #2

## 2023-12-14 ENCOUNTER — Other Ambulatory Visit (HOSPITAL_BASED_OUTPATIENT_CLINIC_OR_DEPARTMENT_OTHER): Payer: Self-pay

## 2023-12-14 ENCOUNTER — Other Ambulatory Visit: Payer: Self-pay

## 2023-12-17 ENCOUNTER — Other Ambulatory Visit (HOSPITAL_BASED_OUTPATIENT_CLINIC_OR_DEPARTMENT_OTHER): Payer: Self-pay

## 2023-12-18 ENCOUNTER — Ambulatory Visit (HOSPITAL_COMMUNITY)
Admission: RE | Admit: 2023-12-18 | Discharge: 2023-12-18 | Disposition: A | Discharge status: Home / Self Care | Source: Ambulatory Visit | Attending: Cardiology | Admitting: Cardiology

## 2023-12-18 ENCOUNTER — Other Ambulatory Visit (HOSPITAL_BASED_OUTPATIENT_CLINIC_OR_DEPARTMENT_OTHER): Payer: Self-pay

## 2023-12-18 DIAGNOSIS — I480 Paroxysmal atrial fibrillation: Secondary | ICD-10-CM | POA: Insufficient documentation

## 2023-12-18 MED ORDER — IOHEXOL 350 MG/ML SOLN
100.0000 mL | Freq: Once | INTRAVENOUS | Status: AC | PRN
  Administered 2023-12-18: 100 mL via INTRAVENOUS

## 2023-12-19 ENCOUNTER — Other Ambulatory Visit (HOSPITAL_BASED_OUTPATIENT_CLINIC_OR_DEPARTMENT_OTHER): Payer: Self-pay

## 2023-12-20 ENCOUNTER — Encounter: Payer: BC Managed Care – PPO | Admitting: Family Medicine

## 2023-12-20 ENCOUNTER — Other Ambulatory Visit (HOSPITAL_BASED_OUTPATIENT_CLINIC_OR_DEPARTMENT_OTHER): Payer: Self-pay

## 2023-12-20 DIAGNOSIS — Z0000 Encounter for general adult medical examination without abnormal findings: Secondary | ICD-10-CM

## 2023-12-21 ENCOUNTER — Other Ambulatory Visit (HOSPITAL_BASED_OUTPATIENT_CLINIC_OR_DEPARTMENT_OTHER): Payer: Self-pay

## 2023-12-24 ENCOUNTER — Other Ambulatory Visit (HOSPITAL_BASED_OUTPATIENT_CLINIC_OR_DEPARTMENT_OTHER): Payer: Self-pay

## 2023-12-25 ENCOUNTER — Other Ambulatory Visit (HOSPITAL_BASED_OUTPATIENT_CLINIC_OR_DEPARTMENT_OTHER): Payer: Self-pay

## 2023-12-26 ENCOUNTER — Telehealth: Payer: Self-pay | Admitting: Cardiology

## 2023-12-26 ENCOUNTER — Encounter: Payer: Self-pay | Admitting: Family Medicine

## 2023-12-26 ENCOUNTER — Other Ambulatory Visit (HOSPITAL_BASED_OUTPATIENT_CLINIC_OR_DEPARTMENT_OTHER): Payer: Self-pay

## 2023-12-26 ENCOUNTER — Ambulatory Visit (INDEPENDENT_AMBULATORY_CARE_PROVIDER_SITE_OTHER): Admitting: Family Medicine

## 2023-12-26 VITALS — BP 121/79 | HR 67 | Ht 74.0 in | Wt 210.0 lb

## 2023-12-26 DIAGNOSIS — Z0000 Encounter for general adult medical examination without abnormal findings: Secondary | ICD-10-CM | POA: Diagnosis not present

## 2023-12-26 DIAGNOSIS — I10 Essential (primary) hypertension: Secondary | ICD-10-CM | POA: Diagnosis not present

## 2023-12-26 NOTE — Telephone Encounter (Signed)
 Spoke with pt. Pt had a one time nose bleed last night while he was sleeping. There was not much blood and it has only happened the once. He is not currently bleeding nor having symptoms. He had a physical exam this morning at his PCP and they showed no signs of concern. Advised pt to call us if this happened again but since it was the one time to continue treatment in preparation for procedure on the 11th.  Pt stated understanding.

## 2023-12-26 NOTE — Telephone Encounter (Signed)
     Where are you bleeding? Nose    When did the bleeding start? Last night    Has the bleeding stopped? Is it continuing? Yes    Do you take blood thinners? Yes he is on Eliquis

## 2023-12-26 NOTE — Progress Notes (Signed)
 Complete physical exam  Patient: Gilbert [name]   DOB: 01/06/1971   53 y.o. Male  MRN: [id number]  Subjective:    Chief Complaint  Patient presents with   Annual Exam    CHARELS STAMBAUGH is a 53 y.o. male who presents today for a complete physical exam. He reports consuming a general diet. The patient does not participate in regular exercise at present. He generally feels well. He reports sleeping fairly well. He does not have additional problems to discuss today.    Acute concerns or interim problems since last visit:  - planning for ablation with cardiology next week - following with integrative medicine [facility]) - ended up with low B12 and D3 (given injections - planning to start oral replacement after ablation), started testosterone injections  - back pain for the past 3 months - no injury triggered; bilateral low back, radiates into buttocks/hip, but does not radiate down leg typically - will discuss further at a future appointment - unexplained weight loss, poor appetite, stress - has to force himself to eat - requesting labs from recent integrative med appt. Will follow-up after ablation to further workup.  Vision concerns: no Dental concerns: no STD concerns: no     Most recent fall risk assessment:    12/26/2023    8:54 AM  Fall Risk   Falls in the past year? 0  Number falls in past yr: 0  Injury with Fall? 0  Risk for fall due to : No Fall Risks  Follow up Falls evaluation completed     Most recent depression screenings:    12/26/2023    8:54 AM 12/07/2021    1:20 PM  PHQ 2/9 Scores  PHQ - 2 Score 2 0  PHQ- 9 Score 3           Patient Care Team: Clayborne Dana, NP as PCP - General (Family Medicine) Regan Lemming, MD as PCP - Electrophysiology (Cardiology) Revankar, Aundra Dubin, MD as PCP - Cardiology (Cardiology) Himmelrich, Loree Fee, RD (Inactive) as Dietitian Jae Dire) Teresa Coombs, MD as Consulting Physician (Ophthalmology)   Outpatient  Medications Prior to Visit  Medication Sig   apixaban (ELIQUIS) 5 MG TABS tablet Take 1 tablet (5 mg total) by mouth 2 (two) times daily.   diltiazem (CARDIZEM CD) 120 MG 24 hr capsule Take 1 capsule (120 mg total) by mouth daily.   eszopiclone (LUNESTA) 2 MG TABS tablet Take 1 tablet (2 mg total) by mouth at bedtime.   flecainide (TAMBOCOR) 50 MG tablet Take 1 tablet (50 mg total) by mouth 2 (two) times daily.   hydrocortisone (ANUSOL-HC) 25 MG suppository Unwrap and place 1 suppository (25 mg total) rectally 2 (two) times daily as needed for hemorrhoids or anal itching.   levocetirizine (XYZAL) 5 MG tablet Take 5 mg by mouth every evening.   sildenafil (VIAGRA) 100 MG tablet Take 0.5-1 tablets (50-100 mg total) by mouth daily as needed for erectile dysfunction.   testosterone cypionate (DEPOTESTOSTERONE CYPIONATE) 200 MG/ML injection Inject 0.5 mLs (100 mg total) into the muscle once a week.   triamcinolone cream (KENALOG) 0.5 % Apply 1 Application topically 2 (two) times daily as needed.   [DISCONTINUED] diltiazem (DILACOR XR) 120 MG 24 hr capsule Take 1 capsule (120 mg total) by mouth daily.   [DISCONTINUED] metoprolol tartrate (LOPRESSOR) 25 MG tablet Take 1 tablet (25 mg total) by mouth 1 to 2 hours prior to cardiac CT scan.   [DISCONTINUED] testosterone cypionate (DEPOTESTOSTERONE  CYPIONATE) 200 MG/ML injection Inject 0.5 mLs (100 mg total) into the muscle every 14 (fourteen) days.   [DISCONTINUED] zolpidem (AMBIEN) 5 MG tablet Take 1 tablet (5 mg total) by mouth at bedtime as needed for sleep.   No facility-administered medications prior to visit.    ROS All review of systems negative except what is listed in the HPI    Wt Readings from Last 3 Encounters:  12/26/23 210 lb (95.3 kg)  10/16/23 227 lb (103 kg)  09/28/23 220 lb (99.8 kg)        Objective:     BP 121/79   Pulse 67   Ht 6\' 2"  (1.88 m)   Wt 210 lb (95.3 kg)   SpO2 100%   BMI 26.96 kg/m    Physical  Exam Vitals reviewed.  Constitutional:      General: He is not in acute distress.    Appearance: Normal appearance. He is not ill-appearing.  HENT:     Head: Normocephalic and atraumatic.     Right Ear: Tympanic membrane normal.     Left Ear: Tympanic membrane normal.     Nose: Nose normal.     Mouth/Throat:     Mouth: Mucous membranes are moist.     Pharynx: Oropharynx is clear.  Eyes:     Extraocular Movements: Extraocular movements intact.     Conjunctiva/sclera: Conjunctivae normal.     Pupils: Pupils are equal, round, and reactive to light.  Cardiovascular:     Rate and Rhythm: Normal rate.     Pulses: Normal pulses.  Pulmonary:     Effort: Pulmonary effort is normal.     Breath sounds: Normal breath sounds.  Abdominal:     General: Abdomen is flat. Bowel sounds are normal. There is no distension.     Palpations: Abdomen is soft. There is no mass.     Tenderness: There is no abdominal tenderness. There is no guarding or rebound.  Genitourinary:    Comments: Deferred exam Musculoskeletal:        General: Normal range of motion.     Cervical back: Normal range of motion and neck supple. No tenderness.     Right lower leg: No edema.     Left lower leg: No edema.  Lymphadenopathy:     Cervical: No cervical adenopathy.  Skin:    General: Skin is warm and dry.     Capillary Refill: Capillary refill takes less than 2 seconds.  Neurological:     General: No focal deficit present.     Mental Status: He is alert and oriented to person, place, and time. Mental status is at baseline.  Psychiatric:        Mood and Affect: Mood normal.        Behavior: Behavior normal.        Thought Content: Thought content normal.        Judgment: Judgment normal.      No results found for any visits on 12/26/23.     Assessment & Plan:    Routine Health Maintenance and Physical Exam Discussed health promotion and safety including diet and exercise recommendations, dental health, and  injury prevention. Tobacco cessation if applicable. Seat belts, sunscreen, smoke detectors, etc.    Immunization History  Administered Date(s) Administered   Hepb-cpg 07/02/2023   Influenza Whole 08/25/2012, 07/09/2020   Influenza, Quadrivalent, Recombinant, Inj, Pf 07/28/2018   Influenza,inj,Quad PF,6+ Mos 07/03/2013, 07/17/2015, 08/04/2016, 08/07/2017   Influenza,inj,quad, With Preservative 07/16/2019  Influenza-Unspecified 07/14/2014, 06/28/2021   PFIZER(Purple Top)SARS-COV-2 Vaccination 05/26/2020, 06/22/2020   Tdap 11/17/2020   Zoster Recombinant(Shingrix) 12/07/2021, 12/14/2022    Health Maintenance  Topic Date Due   Pneumococcal Vaccine 67-65 Years old (1 of 2 - PCV) 12/25/2024 (Originally 07/06/1976)   INFLUENZA VACCINE  04/25/2024   DTaP/Tdap/Td (2 - Td or Tdap) 11/17/2030   Colonoscopy  02/26/2031   Hepatitis C Screening  Completed   HIV Screening  Completed   Zoster Vaccines- Shingrix  Completed   HPV VACCINES  Aged Out   COVID-19 Vaccine  Discontinued        Problem List Items Addressed This Visit       Active Problems   Essential hypertension   Other Visit Diagnoses       Wellness examination    -  Primary     Annual physical exam         -Recent labs with [employer] - will request copy. No additional labs needed today.  -He will follow-up after ablation to discuss concerns related to low back pain and unintentional weight loss (admits he has been under a lot of stress recently). -Health promotion and safety discussed.    PATIENT COUNSELING:    Sexuality: Discussed sexually transmitted diseases, partner selection, use of condoms, avoidance of unintended pregnancy, and contraceptive alternatives.   I discussed with the patient that most people either abstain from alcohol or drink within safe limits (<=14/week and <=4 drinks/occasion for males, <=7/weeks and <= 3 drinks/occasion for females) and that the risk for alcohol disorders and other  health effects rises proportionally with the number of drinks per week and how often a drinker exceeds daily limits.  Discussed cessation/primary prevention of drug use and availability of treatment for abuse.   Diet: Encouraged to adjust caloric intake to maintain or achieve ideal body weight, to reduce intake of dietary saturated fat and total fat, to limit sodium intake by avoiding high sodium foods and not adding table salt, and to maintain adequate dietary potassium and calcium preferably from fresh fruits, vegetables, and low-fat dairy products. Encouraged vitamin D 1000 units and Calcium 1300mg  or 4 servings of dairy a day.  Emphasized the importance of regular exercise.  Injury prevention: Discussed safety belts, safety helmets, smoke detector, smoking near bedding or upholstery.   Dental health: Discussed importance of regular tooth brushing, flossing, and dental visits.       Return in about 1 year (around 12/25/2024) for physical.     Clayborne Dana, NP

## 2023-12-28 ENCOUNTER — Encounter: Payer: Self-pay | Admitting: Neurology

## 2023-12-28 ENCOUNTER — Telehealth (HOSPITAL_COMMUNITY): Payer: Self-pay

## 2023-12-28 LAB — THYROID ANTIBODIES (THYROPEROXIDASE & THYROGLOBULIN)
Thyroglobulin Antibody: <1
Thyroid Peroxidase Aby: 15

## 2023-12-28 LAB — PHOSPHORUS: Phosphorus: 3.7

## 2023-12-28 LAB — FOLATE: Folate: 9

## 2023-12-28 LAB — FERRITIN: Ferritin: 278

## 2023-12-28 LAB — T3, FREE: T3, Free: 3.8

## 2023-12-28 LAB — ESTROGENS, TOTAL: Estrogen, Total: 137

## 2023-12-28 LAB — DHEA-SULFATE: DHEA-Sulfate, Serum: 231

## 2023-12-28 LAB — APOLIPOPROTEIN B: Apolipoprotein B: 85

## 2023-12-28 LAB — URIC ACID: Uric Acid: 6.1

## 2023-12-28 LAB — T3, REVERSE: T3, Reverse: 18.2

## 2023-12-28 LAB — SEDIMENTATION RATE: Sedimentation Rate-Westergren: 9

## 2023-12-28 LAB — LACTATE DEHYDROGENASE: LDH: 153

## 2023-12-28 LAB — T4, FREE: T4, FREE (REFL): 1.16

## 2023-12-28 LAB — MAGNESIUM: Magnesium: 1.8

## 2023-12-28 LAB — SEX HORMONE BINDING GLOBULIN: Sex Horm Binding Glob, Serum: 91

## 2023-12-28 LAB — PSA: PSA, Total: 0.5

## 2023-12-28 LAB — ZINC, RBC: Zinc, RBC: 1182

## 2023-12-28 LAB — ESTRADIOL: Estradiol: 30.6

## 2023-12-28 LAB — PROGESTERONE: Progesterone: 0.3

## 2023-12-28 LAB — HOMOCYSTEINE: Homocysteine: 18.9

## 2023-12-28 LAB — CORTISOL: Cortisol: 11.8

## 2023-12-28 LAB — INSULIN, FREE: Insulin, Free: 5.7

## 2023-12-28 NOTE — Telephone Encounter (Signed)
 Received a return call from patient to discuss upcoming procedure.   CT: completed.  Labs: completed.   Any recent signs of acute illness or been started on antibiotics? No Any new medications started? No Any missed doses of blood thinner? No Advised patient to continue taking ANTICOAGULANT: Eliquis (Apixaban) without missing any doses.  Medication instructions:  On the morning of your procedure DO NOT take any medication., including Eliquis or the procedure may be rescheduled. Nothing to eat or drink after midnight prior to your procedure.  Confirmed patient is scheduled for Atrial Fibrillation Ablation on Friday, April 11 with Dr. Loman Brooklyn. Instructed patient to arrive at the Main Entrance A at [facility]: [address] [place], [place] [address] and check in at Admitting at 8:00 AM.   Advised of plan to go home the same day and will only stay overnight if medically necessary. You MUST have a responsible adult to drive you home and MUST be with you the first 24 hours after you arrive home or your procedure could be cancelled.  Patient verbalized understanding to all instructions provided and agreed to proceed with procedure.

## 2023-12-28 NOTE — Telephone Encounter (Signed)
 Attempted to reach patient to discuss upcoming procedure, no answer. Left VM for patient to return call.

## 2024-01-03 NOTE — Pre-Procedure Instructions (Signed)
 Attempted to call patient regarding procedure instructions for tomorrow.  Left voicemail on the following items: Arrival time 0800 Nothing to eat or drink after midnight No meds AM of procedure Responsible person to drive you home and stay with you for 24 hrs  Have you missed any doses of anti-coagulant Eliquis- should be taken twice a day, if you have missed any doses please let us know.  Don't take dose in the morning.

## 2024-01-03 NOTE — Anesthesia Preprocedure Evaluation (Signed)
 Anesthesia Evaluation  Patient identified by MRN, date of birth, ID band Patient awake    Reviewed: Allergy & Precautions, NPO status , Patient's Chart, lab work & pertinent test results  History of Anesthesia Complications Negative for: history of anesthetic complications  Airway Mallampati: III  TM Distance: >3 FB Neck ROM: Full    Dental  (+) Dental Advisory Given   Pulmonary neg shortness of breath, asthma (patient denies) , neg sleep apnea, neg COPD, neg recent URI   Pulmonary exam normal breath sounds clear to auscultation       Cardiovascular hypertension, (-) angina (-) Past MI, (-) Cardiac Stents and (-) CABG + dysrhythmias Atrial Fibrillation  Rhythm:Regular Rate:Normal  Stress Echo 03/22/2022: IMPRESSIONS    1. This is an inconclusive stress echocardiogram for ischemia.  Non-diagnostic study for ischemia due to Poor definity enhancement of the  apical LV during peak exercise. Cannot rule out apcial wall motion  abnormality during peak stress.   2. This is an indeterminate risk study.     Neuro/Psych  PSYCHIATRIC DISORDERS  Depression    negative neurological ROS     GI/Hepatic ,GERD  ,,(+) Hepatitis - (many years ago, unknown type)S/p Lapband and HH repair 03/2012   Endo/Other  negative endocrine ROS    Renal/GU negative Renal ROS     Musculoskeletal   Abdominal   Peds  Hematology negative hematology ROS (+) Lab Results      Component                Value               Date                      WBC                      7.7                 12/10/2023                HGB                      13.7                12/10/2023                HCT                      40.0                12/10/2023                MCV                      97                  12/10/2023                PLT                      269                 12/10/2023              Anesthesia Other Findings Last Eliquis: last night   Reproductive/Obstetrics  Anesthesia Physical Anesthesia Plan  ASA: 3  Anesthesia Plan: General   Post-op Pain Management: Tylenol PO (pre-op)*   Induction: Intravenous  PONV Risk Score and Plan: 2 and Ondansetron, Dexamethasone and Treatment may vary due to age or medical condition  Airway Management Planned: Oral ETT  Additional Equipment:   Intra-op Plan:   Post-operative Plan: Extubation in OR  Informed Consent: I have reviewed the patients History and Physical, chart, labs and discussed the procedure including the risks, benefits and alternatives for the proposed anesthesia with the patient or authorized representative who has indicated his/her understanding and acceptance.     Dental advisory given  Plan Discussed with: CRNA and Anesthesiologist  Anesthesia Plan Comments: (Risks of general anesthesia discussed including, but not limited to, sore throat, hoarse voice, chipped/damaged teeth, injury to vocal cords, nausea and vomiting, allergic reactions, lung infection, heart attack, stroke, and death. All questions answered. )        Anesthesia Quick Evaluation

## 2024-01-04 ENCOUNTER — Ambulatory Visit (HOSPITAL_COMMUNITY): Payer: Self-pay | Admitting: Anesthesiology

## 2024-01-04 ENCOUNTER — Encounter (HOSPITAL_COMMUNITY)
Admission: RE | Disposition: A | Discharge status: Home / Self Care | Payer: Self-pay | Source: Home / Self Care | Attending: Cardiology

## 2024-01-04 ENCOUNTER — Other Ambulatory Visit: Payer: Self-pay

## 2024-01-04 ENCOUNTER — Ambulatory Visit (HOSPITAL_COMMUNITY)
Admission: RE | Admit: 2024-01-04 | Discharge: 2024-01-04 | Disposition: A | Discharge status: Home / Self Care | Payer: BC Managed Care – PPO | Attending: Cardiology | Admitting: Cardiology

## 2024-01-04 DIAGNOSIS — Z6827 Body mass index (BMI) 27.0-27.9, adult: Secondary | ICD-10-CM | POA: Insufficient documentation

## 2024-01-04 DIAGNOSIS — I4891 Unspecified atrial fibrillation: Secondary | ICD-10-CM | POA: Diagnosis not present

## 2024-01-04 DIAGNOSIS — E669 Obesity, unspecified: Secondary | ICD-10-CM | POA: Diagnosis not present

## 2024-01-04 DIAGNOSIS — I480 Paroxysmal atrial fibrillation: Secondary | ICD-10-CM | POA: Insufficient documentation

## 2024-01-04 DIAGNOSIS — I10 Essential (primary) hypertension: Secondary | ICD-10-CM | POA: Insufficient documentation

## 2024-01-04 DIAGNOSIS — J45909 Unspecified asthma, uncomplicated: Secondary | ICD-10-CM | POA: Diagnosis not present

## 2024-01-04 DIAGNOSIS — I483 Typical atrial flutter: Secondary | ICD-10-CM | POA: Insufficient documentation

## 2024-01-04 HISTORY — PX: ATRIAL FIBRILLATION ABLATION: EP1191

## 2024-01-04 LAB — POCT ACTIVATED CLOTTING TIME: Activated Clotting Time: 297 s

## 2024-01-04 SURGERY — ATRIAL FIBRILLATION ABLATION
Anesthesia: General

## 2024-01-04 MED ORDER — PROTAMINE SULFATE 10 MG/ML IV SOLN
INTRAVENOUS | Status: DC | PRN
  Administered 2024-01-04: 40 mg via INTRAVENOUS

## 2024-01-04 MED ORDER — SODIUM CHLORIDE 0.9 % IV SOLN: INTRAVENOUS | Status: DC

## 2024-01-04 MED ORDER — ROCURONIUM BROMIDE 10 MG/ML (PF) SYRINGE
PREFILLED_SYRINGE | INTRAVENOUS | Status: DC | PRN
  Administered 2024-01-04: 60 mg via INTRAVENOUS
  Administered 2024-01-04: 20 mg via INTRAVENOUS

## 2024-01-04 MED ORDER — MIDAZOLAM HCL 2 MG/2ML IJ SOLN
INTRAMUSCULAR | Status: DC | PRN
  Administered 2024-01-04: 2 mg via INTRAVENOUS

## 2024-01-04 MED ORDER — ATROPINE SULFATE 1 MG/ML IV SOLN
INTRAVENOUS | Status: DC | PRN
  Administered 2024-01-04: 1 mg via INTRAVENOUS

## 2024-01-04 MED ORDER — ATROPINE SULFATE 1 MG/10ML IJ SOSY
PREFILLED_SYRINGE | INTRAMUSCULAR | Status: AC
  Filled 2024-01-04: qty 10

## 2024-01-04 MED ORDER — SUGAMMADEX SODIUM 200 MG/2ML IV SOLN
INTRAVENOUS | Status: DC | PRN
  Administered 2024-01-04: 199.6 mg via INTRAVENOUS

## 2024-01-04 MED ORDER — MIDAZOLAM HCL 2 MG/2ML IJ SOLN
INTRAMUSCULAR | Status: AC
  Filled 2024-01-04: qty 2

## 2024-01-04 MED ORDER — ONDANSETRON HCL 4 MG/2ML IJ SOLN
INTRAMUSCULAR | Status: DC | PRN
  Administered 2024-01-04: 4 mg via INTRAVENOUS

## 2024-01-04 MED ORDER — LIDOCAINE 2% (20 MG/ML) 5 ML SYRINGE
INTRAMUSCULAR | Status: DC | PRN
  Administered 2024-01-04: 100 mg via INTRAVENOUS

## 2024-01-04 MED ORDER — FENTANYL CITRATE (PF) 250 MCG/5ML IJ SOLN
INTRAMUSCULAR | Status: DC | PRN
  Administered 2024-01-04 (×2): 50 ug via INTRAVENOUS

## 2024-01-04 MED ORDER — ONDANSETRON HCL 4 MG/2ML IJ SOLN: 4.0000 mg | Freq: Four times a day (QID) | INTRAMUSCULAR | Status: DC | PRN

## 2024-01-04 MED ORDER — HEPARIN SODIUM (PORCINE) 1000 UNIT/ML IJ SOLN
INTRAMUSCULAR | Status: DC | PRN
  Administered 2024-01-04: 4000 [IU] via INTRAVENOUS
  Administered 2024-01-04: 15000 [IU] via INTRAVENOUS

## 2024-01-04 MED ORDER — FENTANYL CITRATE (PF) 100 MCG/2ML IJ SOLN
INTRAMUSCULAR | Status: AC
  Filled 2024-01-04: qty 2

## 2024-01-04 MED ORDER — ACETAMINOPHEN 500 MG PO TABS
1000.0000 mg | ORAL_TABLET | Freq: Once | ORAL | Status: AC
  Administered 2024-01-04: 1000 mg via ORAL
  Filled 2024-01-04: qty 2

## 2024-01-04 MED ORDER — DEXAMETHASONE SODIUM PHOSPHATE 10 MG/ML IJ SOLN
INTRAMUSCULAR | Status: DC | PRN
  Administered 2024-01-04: 4 mg via INTRAVENOUS

## 2024-01-04 MED ORDER — PROPOFOL 10 MG/ML IV BOLUS
INTRAVENOUS | Status: DC | PRN
  Administered 2024-01-04: 200 mg via INTRAVENOUS

## 2024-01-04 MED ORDER — HEPARIN (PORCINE) IN NACL 1000-0.9 UT/500ML-% IV SOLN
INTRAVENOUS | Status: DC | PRN
  Administered 2024-01-04 (×3): 500 mL

## 2024-01-04 MED ORDER — PHENYLEPHRINE HCL-NACL 20-0.9 MG/250ML-% IV SOLN
INTRAVENOUS | Status: DC | PRN
  Administered 2024-01-04: 20 ug/min via INTRAVENOUS

## 2024-01-04 SURGICAL SUPPLY — 22 items
BAG SNAP BAND KOVER 36X36 (MISCELLANEOUS) IMPLANT
BLANKET WARM UNDERBOD FULL ACC (MISCELLANEOUS) IMPLANT
CABLE PFA RX CATH CONN (CABLE) IMPLANT
CATH EZ STEER NAV 8MM D-F CUR (ABLATOR) IMPLANT
CATH FARAWAVE ABLATION 31 (CATHETERS) IMPLANT
CATH GE 8FR SOUNDSTAR (CATHETERS) IMPLANT
CATH OCTARAY 2.0 F 3-3-3-3-3 (CATHETERS) IMPLANT
CATH WEBSTER BI DIR CS D-F CRV (CATHETERS) IMPLANT
CLOSURE MYNX CONTROL 6F/7F (Vascular Products) IMPLANT
CLOSURE PERCLOSE PROSTYLE (VASCULAR PRODUCTS) IMPLANT
COVER SWIFTLINK CONNECTOR (BAG) ×1 IMPLANT
DILATOR VESSEL 38 20CM 16FR (INTRODUCER) IMPLANT
GUIDEWIRE INQWIRE 1.5J.035X260 (WIRE) IMPLANT
INQWIRE 1.5J .035X260CM (WIRE) ×1 IMPLANT
KIT VERSACROSS CNCT FARADRIVE (KITS) IMPLANT
MAT PREVALON FULL STRYKER (MISCELLANEOUS) IMPLANT
PACK EP LF (CUSTOM PROCEDURE TRAY) ×1 IMPLANT
PAD DEFIB RADIO PHYSIO CONN (PAD) ×1 IMPLANT
SHEATH FARADRIVE STEERABLE (SHEATH) IMPLANT
SHEATH PINNACLE 8F 10CM (SHEATH) IMPLANT
SHEATH PINNACLE 9F 10CM (SHEATH) IMPLANT
SHEATH PROBE COVER 6X72 (BAG) IMPLANT

## 2024-01-04 NOTE — H&P (Signed)
  Electrophysiology Office Note:   Date:  01/04/2024  ID:  Gilbert [name], DOB Dec 11, 1970, MRN [id number]  Primary Cardiologist: Garwin Brothers, MD Primary Heart Failure: None Electrophysiologist: Reshonda Koerber Jorja Loa, MD      History of Present Illness:   Gilbert [name] is a 53 y.o. male with h/o hypertension, obesity, atrial fibrillation seen today for routine electrophysiology followup.   Today, denies symptoms of palpitations, chest pain, shortness of breath, orthopnea, PND, lower extremity edema, claudication, dizziness, presyncope, syncope, bleeding, or neurologic sequela. The patient is tolerating medications without difficulties. Plan ablation today.   EP Information / Studies Reviewed:    EKG is ordered today. Personal review as below.        Risk Assessment/Calculations:    CHA2DS2-VASc Score = 1   This indicates a 0.6% annual risk of stroke. The patient's score is based upon: CHF History: 0 HTN History: 1 Diabetes History: 0 Stroke History: 0 Vascular Disease History: 0 Age Score: 0 Gender Score: 0              Physical Exam:   VS:  BP 126/79   Pulse 61   Temp 98.2 F (36.8 C) (Oral)   Resp 17   Ht 6\' 3"  (1.905 m)   Wt 99.8 kg   SpO2 99%   BMI 27.50 kg/m    Wt Readings from Last 3 Encounters:  01/04/24 99.8 kg  12/26/23 95.3 kg  10/16/23 103 kg    GEN: No acute distress.   Neck: No JVD Cardiac: RRR, no murmurs, rubs, or gallops.  Respiratory: decreased BS bases bilaterally. GI: Soft, nontender, non-distended  MS: No edema; No deformity. Neuro:  Nonfocal  Skin: warm and dry Psych: Normal affect    ASSESSMENT AND PLAN:    1.  Paroxysmal atrial fibrillation/flutter: Gilbert [name] has presented today for surgery, with the diagnosis of AF/flutter.  The various methods of treatment have been discussed with the patient and family. After consideration of risks, benefits and other options for treatment, the patient has consented to   Procedure(s): Catheter ablation as a surgical intervention .  Risks include but not limited to complete heart block, stroke, esophageal damage, nerve damage, bleeding, vascular damage, tamponade, perforation, MI, and death. The patient's history has been reviewed, patient examined, no change in status, stable for surgery.  I have reviewed the patient's chart and labs.  Questions were answered to the patient's satisfaction.    Santoria Chason Elberta Fortis, MD 01/04/2024 9:12 AM

## 2024-01-04 NOTE — Anesthesia Postprocedure Evaluation (Signed)
 Anesthesia Post Note  Patient: Gilbert [name]  Procedure(s) Performed: ATRIAL FIBRILLATION ABLATION     Patient location during evaluation: PACU Anesthesia Type: General Level of consciousness: awake Pain management: pain level controlled Vital Signs Assessment: post-procedure vital signs reviewed and stable Respiratory status: spontaneous breathing, nonlabored ventilation and respiratory function stable Cardiovascular status: blood pressure returned to baseline and stable Postop Assessment: no apparent nausea or vomiting Anesthetic complications: no   There were no known notable events for this encounter.  Last Vitals:  Vitals:   01/04/24 1230 01/04/24 1245  BP: 123/82 122/86  Pulse: 64 (!) 56  Resp: 13 14  Temp:    SpO2: 98% 98%    Last Pain:  Vitals:   01/04/24 1228  TempSrc:   PainSc: 0-No pain                 Linton Rump

## 2024-01-04 NOTE — Anesthesia Procedure Notes (Signed)
 Procedure Name: Intubation Date/Time: 01/04/2024 10:09 AM  Performed by: Camillia Herter, CRNAPre-anesthesia Checklist: Patient identified, Emergency Drugs available, Suction available and Patient being monitored Patient Re-evaluated:Patient Re-evaluated prior to induction Oxygen Delivery Method: Circle System Utilized Preoxygenation: Pre-oxygenation with 100% oxygen Induction Type: IV induction Ventilation: Mask ventilation without difficulty Laryngoscope Size: Miller and 2 Grade View: Grade II Tube type: Oral Tube size: 7.5 mm Number of attempts: 1 Airway Equipment and Method: Stylet and Oral airway Placement Confirmation: ETT inserted through vocal cords under direct vision, positive ETCO2 and breath sounds checked- equal and bilateral Secured at: 23 cm Tube secured with: Tape Dental Injury: Teeth and Oropharynx as per pre-operative assessment

## 2024-01-04 NOTE — Transfer of Care (Signed)
 Immediate Anesthesia Transfer of Care Note  Patient: Gilbert [name]  Procedure(s) Performed: ATRIAL FIBRILLATION ABLATION  Patient Location: Cath Lab  Anesthesia Type:General  Level of Consciousness: awake and alert   Airway & Oxygen Therapy: Patient Spontanous Breathing  Post-op Assessment: Report given to RN and Post -op Vital signs reviewed and stable  Post vital signs: Reviewed and stable  Last Vitals:  Vitals Value Taken Time  BP 133/74 01/04/24 1145  Temp 98   Pulse 76 01/04/24 1146  Resp 10 01/04/24 1146  SpO2 100 % 01/04/24 1146  Vitals shown include unfiled device data.  Last Pain:  Vitals:   01/04/24 1143  TempSrc:   PainSc: 0-No pain         Complications: There were no known notable events for this encounter.

## 2024-01-04 NOTE — Discharge Instructions (Signed)

## 2024-01-07 ENCOUNTER — Encounter (HOSPITAL_COMMUNITY): Payer: Self-pay | Admitting: Cardiology

## 2024-01-07 ENCOUNTER — Telehealth (HOSPITAL_COMMUNITY): Payer: Self-pay

## 2024-01-07 NOTE — Telephone Encounter (Signed)
 Attempted to reach patient to follow up with procedure completed on 01/04/24, no answer. Left VM for patient to return call.

## 2024-01-08 MED FILL — Atropine Sulfate Soln Prefill Syr 1 MG/10ML (0.1 MG/ML): INTRAMUSCULAR | Qty: 10 | Status: AC

## 2024-01-08 MED FILL — Fentanyl Citrate Preservative Free (PF) Inj 100 MCG/2ML: INTRAMUSCULAR | Qty: 2 | Status: AC

## 2024-01-09 ENCOUNTER — Other Ambulatory Visit (HOSPITAL_BASED_OUTPATIENT_CLINIC_OR_DEPARTMENT_OTHER): Payer: Self-pay

## 2024-01-10 ENCOUNTER — Other Ambulatory Visit: Payer: Self-pay

## 2024-01-10 ENCOUNTER — Other Ambulatory Visit (HOSPITAL_BASED_OUTPATIENT_CLINIC_OR_DEPARTMENT_OTHER): Payer: Self-pay

## 2024-01-11 ENCOUNTER — Other Ambulatory Visit (HOSPITAL_BASED_OUTPATIENT_CLINIC_OR_DEPARTMENT_OTHER): Payer: Self-pay

## 2024-01-14 ENCOUNTER — Telehealth: Payer: Self-pay | Admitting: Cardiology

## 2024-01-14 ENCOUNTER — Encounter: Payer: Self-pay | Admitting: Cardiology

## 2024-01-14 NOTE — Telephone Encounter (Signed)
 Patient stated he is having some additional bruising after his ablation and wants a call back to discuss next steps.

## 2024-01-14 NOTE — Telephone Encounter (Signed)
 Spoke with patient and he states he had ablation 4/11 and the bruising at the site is getting larger. He doesn't feel its warm to touch but a little tender. He would like a call back to discuss

## 2024-01-14 NOTE — Telephone Encounter (Signed)
 Reports that he noticed more bruising today then what he has been seeing. Missed one Eliquis  this past weekend. Reports the bruise is "about double the size of what it was last week".  He is not sure if he hit it or his dog may have jumped on it, he is really not sure. He does report the bruising as "going down my leg". Pt is going to try and send pictures via mychart and we can go from there. He is agreeable to plan.

## 2024-01-17 ENCOUNTER — Other Ambulatory Visit (HOSPITAL_BASED_OUTPATIENT_CLINIC_OR_DEPARTMENT_OTHER): Payer: Self-pay

## 2024-01-17 ENCOUNTER — Other Ambulatory Visit (HOSPITAL_COMMUNITY): Payer: BC Managed Care – PPO

## 2024-01-17 ENCOUNTER — Encounter: Payer: Self-pay | Admitting: Cardiology

## 2024-01-17 ENCOUNTER — Telehealth: Payer: Self-pay | Admitting: Cardiology

## 2024-01-17 ENCOUNTER — Other Ambulatory Visit: Payer: Self-pay

## 2024-01-17 NOTE — Telephone Encounter (Signed)
 Spoke with pt regarding his bruising. Pt had sent pictures via MyChart that were forwarded to Dr. Lawana Pray and his nurse. See MyChart messages from 4/24. Pt was told they would be reaching out to him with suggestions. Pt verbalized understanding. All questions if any were answered.

## 2024-01-17 NOTE — Telephone Encounter (Addendum)
 Returned call to Betty Bruckner and she states she know patient called but wanted to make sure we had all the information because she do not think his site bruising his normal.   She states the bruising came 7 days post ablation. It his getting bigger going down his thigh and the site is tender. She states the site is also now really firm and and feels completely different than the other side.

## 2024-01-17 NOTE — Telephone Encounter (Signed)
 Patient called to follow-up on MyChart visit.

## 2024-01-17 NOTE — Telephone Encounter (Signed)
 Pt's girlfriend Betty Bruckner is requesting cb to further discuss bruising. Not feeling comfortable with us  staying it is normal. [phone]

## 2024-01-18 ENCOUNTER — Ambulatory Visit (HOSPITAL_BASED_OUTPATIENT_CLINIC_OR_DEPARTMENT_OTHER)
Admission: RE | Admit: 2024-01-18 | Discharge: 2024-01-18 | Disposition: A | Discharge status: Home / Self Care | Source: Ambulatory Visit | Attending: Family Medicine | Admitting: Family Medicine

## 2024-01-18 ENCOUNTER — Ambulatory Visit: Admitting: Family Medicine

## 2024-01-18 ENCOUNTER — Encounter: Payer: Self-pay | Admitting: Family Medicine

## 2024-01-18 ENCOUNTER — Other Ambulatory Visit (HOSPITAL_BASED_OUTPATIENT_CLINIC_OR_DEPARTMENT_OTHER): Payer: Self-pay

## 2024-01-18 VITALS — BP 113/81 | HR 84 | Temp 97.9°F | Resp 18 | Ht 74.0 in | Wt 210.0 lb

## 2024-01-18 DIAGNOSIS — R5383 Other fatigue: Secondary | ICD-10-CM | POA: Diagnosis not present

## 2024-01-18 DIAGNOSIS — M545 Low back pain, unspecified: Secondary | ICD-10-CM | POA: Diagnosis not present

## 2024-01-18 DIAGNOSIS — R7989 Other specified abnormal findings of blood chemistry: Secondary | ICD-10-CM

## 2024-01-18 DIAGNOSIS — G4730 Sleep apnea, unspecified: Secondary | ICD-10-CM | POA: Diagnosis not present

## 2024-01-18 DIAGNOSIS — R630 Anorexia: Secondary | ICD-10-CM | POA: Diagnosis not present

## 2024-01-18 DIAGNOSIS — R110 Nausea: Secondary | ICD-10-CM | POA: Diagnosis not present

## 2024-01-18 DIAGNOSIS — M5442 Lumbago with sciatica, left side: Secondary | ICD-10-CM | POA: Diagnosis not present

## 2024-01-18 DIAGNOSIS — M47817 Spondylosis without myelopathy or radiculopathy, lumbosacral region: Secondary | ICD-10-CM | POA: Diagnosis not present

## 2024-01-18 DIAGNOSIS — Z09 Encounter for follow-up examination after completed treatment for conditions other than malignant neoplasm: Secondary | ICD-10-CM | POA: Diagnosis not present

## 2024-01-18 DIAGNOSIS — G8929 Other chronic pain: Secondary | ICD-10-CM | POA: Diagnosis not present

## 2024-01-18 DIAGNOSIS — N529 Male erectile dysfunction, unspecified: Secondary | ICD-10-CM

## 2024-01-18 LAB — COMPREHENSIVE METABOLIC PANEL WITH GFR
ALT: 9 U/L (ref 0–53)
AST: 13 U/L (ref 0–37)
Albumin: 4.3 g/dL (ref 3.5–5.2)
Alkaline Phosphatase: 70 U/L (ref 39–117)
BUN: 7 mg/dL (ref 6–23)
CO2: 33 meq/L — ABNORMAL HIGH (ref 19–32)
Calcium: 9.5 mg/dL (ref 8.4–10.5)
Chloride: 102 meq/L (ref 96–112)
Creatinine, Ser: 0.88 mg/dL (ref 0.40–1.50)
GFR: 98.16 mL/min (ref >60.00)
Glucose, Bld: 85 mg/dL (ref 70–99)
Potassium: 4.3 meq/L (ref 3.5–5.1)
Sodium: 140 meq/L (ref 135–145)
Total Bilirubin: 0.5 mg/dL (ref 0.2–1.2)
Total Protein: 6.7 g/dL (ref 6.0–8.3)

## 2024-01-18 LAB — CBC WITH DIFFERENTIAL/PLATELET
Basophils Absolute: 0.1 10*3/uL (ref 0.0–0.1)
Basophils Relative: 1.1 % (ref 0.0–3.0)
Eosinophils Absolute: 0.1 10*3/uL (ref 0.0–0.7)
Eosinophils Relative: 1.3 % (ref 0.0–5.0)
HCT: 41.8 % (ref 39.0–52.0)
Hemoglobin: 14.1 g/dL (ref 13.0–17.0)
Lymphocytes Relative: 17.7 % (ref 12.0–46.0)
Lymphs Abs: 0.9 10*3/uL (ref 0.7–4.0)
MCHC: 33.9 g/dL (ref 30.0–36.0)
MCV: 97.9 fl (ref 78.0–100.0)
Monocytes Absolute: 0.5 10*3/uL (ref 0.1–1.0)
Monocytes Relative: 9.3 % (ref 3.0–12.0)
Neutro Abs: 3.4 10*3/uL (ref 1.4–7.7)
Neutrophils Relative %: 70.6 % (ref 43.0–77.0)
Platelets: 264 10*3/uL (ref 150.0–400.0)
RBC: 4.27 Mil/uL (ref 4.22–5.81)
RDW: 13 % (ref 11.5–15.5)
WBC: 4.9 10*3/uL (ref 4.0–10.5)

## 2024-01-18 LAB — TESTOSTERONE: Testosterone: 1100.98 ng/dL — ABNORMAL HIGH (ref 300.00–890.00)

## 2024-01-18 MED ORDER — SILDENAFIL CITRATE 100 MG PO TABS
50.0000 mg | ORAL_TABLET | Freq: Every day | ORAL | 5 refills | Status: DC | PRN
  Filled 2024-01-18: qty 14, 14d supply, fill #0
  Filled 2024-02-13: qty 14, 14d supply, fill #1
  Filled 2024-02-28: qty 14, 14d supply, fill #2
  Filled 2024-03-10: qty 14, 14d supply, fill #3
  Filled 2024-04-10: qty 14, 14d supply, fill #4
  Filled 2024-08-05: qty 14, 28d supply, fill #5

## 2024-01-18 NOTE — Assessment & Plan Note (Signed)
 Chronic left-sided low back pain with possible sciatica. No specific injury. Last lumbar x-ray in 2008. - Order lumbar x-ray. - Refer to physical therapy. - Advise lidocaine  patches, acetaminophen , heat/ice, and massage. - Consider orthopedic referral if no improvement with physical therapy

## 2024-01-18 NOTE — Progress Notes (Signed)
 Acute Office Visit  Subjective:     Patient ID: Gilbert [name], male    DOB: [birth year], 53 y.o.   MRN: [id number]  Chief Complaint  Patient presents with   Follow-up    1 week- cardiac following ablastin Had nausea, abd pain, heart burn decreed appetite- all since ablation. Bowel movements are irregular.     HPI Patient is in today for various concerns. He is with his girlfriend.   Discussed the use of AI scribe software for clinical note transcription with the patient, who gave verbal consent to proceed.  History of Present Illness Gilbert [name] is a 53 year old male who presents with gastrointestinal symptoms and concerns about testosterone  therapy.  He has been experiencing persistent gastrointestinal symptoms, including nausea and a lack of appetite, for approximately a year. He often feels sick after meals and has to force himself to eat. Over the past few weeks, he has experienced significant weight loss, now with fluctuations between 208 and 210 pounds. He underwent a colonoscopy in 2022 and had a hiatal hernia repair and bariatric surgery in the past. He has not seen a GI specialist recently. No unusual cough, fevers, or vomiting. He feels tired all the time and has a poor appetite, with nausea and a sensation of fullness after eating.  He is currently on testosterone  therapy, receiving injections once a week - prescribed by an integrative medicine clinic. Despite this, he does not feel any improvement in energy levels and describes feeling irritable, particularly in the days following the injection. His testosterone  level was last checked in February and was 745 ng/dL. He has been on testosterone  therapy for about three months, with the dose recently increased to weekly injections. He is also on vitamin D  and B12 supplements due to previous deficiencies.  He has concerns about sleep apnea, noting that he snores and has observed apneas. A sleep study conducted many years ago  did not diagnose sleep apnea, but he weighed significantly more at that time. He experiences fatigue and low energy levels.  He mentions a history of back pain, primarily in the lower back, more on the left side, with occasional sciatica-like symptoms. The pain has been present for months, and he has not had any recent imaging studies. He has recently started exercising again, which sometimes exacerbates the pain.  He is on Eliquis  twice a day following a recent ablation procedure. About a week after the procedure, he developed a bruise that has been changing in size and color. He notes a feeling of fullness and tenderness in the area, but no severe pain or oozing. He has messaged the cardiologist about this, but would like me to take a look at it today.          Wt Readings from Last 3 Encounters:  01/18/24 210 lb (95.3 kg)  01/04/24 220 lb (99.8 kg)  12/26/23 210 lb (95.3 kg)     ROS All review of systems negative except what is listed in the HPI      Objective:    BP 113/81 (BP Location: Right Arm, Patient Position: Bed low/side rails up, Cuff Size: Normal)   Pulse 84   Temp 97.9 F (36.6 C) (Temporal)   Resp 18   Ht 6\' 2"  (1.88 m)   Wt 210 lb (95.3 kg)   SpO2 99%   BMI 26.96 kg/m    Physical Exam Vitals reviewed.  Constitutional:      General: He is not in  acute distress.    Appearance: Normal appearance. He is not ill-appearing.  Cardiovascular:     Rate and Rhythm: Normal rate and regular rhythm.     Heart sounds: Normal heart sounds.  Pulmonary:     Effort: Pulmonary effort is normal.     Breath sounds: Normal breath sounds.  Abdominal:     General: Bowel sounds are normal. There is no distension.     Palpations: Abdomen is soft. There is no mass.     Tenderness: There is no guarding or rebound.  Musculoskeletal:       Legs:     Comments: L groin- Access site without inflammation, nodules, bleeding, ecchymosis; upper leg, below access with ecchymosis of  varying stages of healing (yellow, purple/blue-fading), soft, mildly tender to palpation; slight fullness of left upper inner thigh compared to right leg, but no alarming abnormalities.   Skin:    General: Skin is warm and dry.     Findings: No erythema.  Neurological:     Mental Status: He is alert and oriented to person, place, and time.  Psychiatric:        Mood and Affect: Mood normal.        Behavior: Behavior normal.        Thought Content: Thought content normal.        Judgment: Judgment normal.        No results found for any visits on 01/18/24.      Assessment & Plan:   Problem List Items Addressed This Visit       Active Problems   Backache   Chronic left-sided low back pain with possible sciatica. No specific injury. Last lumbar x-ray in 2008. - Order lumbar x-ray. - Refer to physical therapy. - Advise lidocaine  patches, acetaminophen , heat/ice, and massage. - Consider orthopedic referral if no improvement with physical therapy       Relevant Orders   DG Lumbar Spine 2-3 Views   Ambulatory referral to Physical Therapy   Low testosterone    On weekly testosterone  injections. Previous level 745 ng/dL. Concerns about irritability and lack of energy. Recent dose increase. Partner reports mood changes with injection timing. Integrative specialist is monitoring/treating this.  - Order testosterone  level. - Monitor for symptoms of hyperandrogenism.      Relevant Orders   Testosterone    Other Visit Diagnoses       Fatigue, unspecified type    -  Primary Adding a few labs today. Previous extensive lab testing with integrative medicine.   Relevant Orders   CEA   Testosterone    CBC with Differential/Platelet   Ambulatory referral to Sleep Studies     Observed sleep apnea     History of observed apnea and snoring. Previous inconclusive sleep study at higher weight. Referral for sleep study.   Relevant Orders   Ambulatory referral to Sleep Studies     Nausea      Chronic GI symptoms with weight loss.  - Order CEA (pt request), H. Pylori, and liver function tests. - Refer to GI specialist. - Consider abdominal ultrasound if initial tests are negative.   Relevant Orders   Ambulatory referral to Gastroenterology   H. pylori breath test   Comprehensive metabolic panel with GFR     Decreased appetite     See above   Relevant Orders   CEA   Ambulatory referral to Gastroenterology   H. pylori breath test   Comprehensive metabolic panel with GFR     Erectile  dysfunction, unspecified erectile dysfunction type     Refill provided.    Relevant Medications   sildenafil  (VIAGRA ) 100 MG tablet     Postop check     Post-ablation groin bruising and tenderness. On apixaban . No active bleeding or hematoma. Cardiologist advised monitoring. No alarm findings today.  - Advise daily photographic documentation of bruising. - Instruct to contact cardiologist if symptoms worsen by Monday. - Check hemoglobin level today.   Relevant Orders   CBC with Differential/Platelet       Meds ordered this encounter  Medications   sildenafil  (VIAGRA ) 100 MG tablet    Sig: Take 0.5-1 tablets (50-100 mg total) by mouth daily as needed for erectile dysfunction.    Dispense:  14 tablet    Refill:  5    Supervising Provider:   Randie Bustle A [[id number]]    Return if symptoms worsen or fail to improve.  Everlina Hock, NP

## 2024-01-18 NOTE — Assessment & Plan Note (Signed)
 On weekly testosterone  injections. Previous level 745 ng/dL. Concerns about irritability and lack of energy. Recent dose increase. Partner reports mood changes with injection timing. Integrative specialist is monitoring/treating this.  - Order testosterone  level. - Monitor for symptoms of hyperandrogenism.

## 2024-01-18 NOTE — Telephone Encounter (Signed)
 Late entry:  called and spoke with pt yesterday late afternoon.

## 2024-01-19 LAB — CEA: CEA: <2 ng/mL

## 2024-01-21 ENCOUNTER — Encounter: Payer: Self-pay | Admitting: Family Medicine

## 2024-01-22 ENCOUNTER — Other Ambulatory Visit (HOSPITAL_BASED_OUTPATIENT_CLINIC_OR_DEPARTMENT_OTHER): Payer: Self-pay

## 2024-01-22 LAB — H. PYLORI BREATH TEST: H. pylori Breath Test: NOT DETECTED

## 2024-01-23 ENCOUNTER — Encounter: Payer: Self-pay | Admitting: Family Medicine

## 2024-01-30 ENCOUNTER — Other Ambulatory Visit (HOSPITAL_BASED_OUTPATIENT_CLINIC_OR_DEPARTMENT_OTHER): Payer: Self-pay

## 2024-01-30 DIAGNOSIS — B078 Other viral warts: Secondary | ICD-10-CM | POA: Diagnosis not present

## 2024-01-30 DIAGNOSIS — L739 Follicular disorder, unspecified: Secondary | ICD-10-CM | POA: Diagnosis not present

## 2024-01-30 MED ORDER — DOXYCYCLINE HYCLATE 50 MG PO CAPS
50.0000 mg | ORAL_CAPSULE | Freq: Every day | ORAL | 1 refills | Status: DC
  Filled 2024-01-30 – 2024-07-01 (×4): qty 30, 30d supply, fill #0
  Filled 2024-08-05: qty 30, 30d supply, fill #1

## 2024-01-30 MED ORDER — CLINDAMYCIN PHOSPHATE 1 % EX SOLN
1.0000 | Freq: Two times a day (BID) | CUTANEOUS | 1 refills | Status: DC
  Filled 2024-01-30 – 2024-02-27 (×2): qty 60, 30d supply, fill #0

## 2024-01-31 ENCOUNTER — Other Ambulatory Visit (HOSPITAL_BASED_OUTPATIENT_CLINIC_OR_DEPARTMENT_OTHER): Payer: Self-pay

## 2024-01-31 MED ORDER — CLINDAMYCIN PHOSPHATE 1 % EX SOLN
1.0000 | Freq: Two times a day (BID) | CUTANEOUS | 1 refills | Status: DC
Start: 2024-01-31 — End: 2024-04-21
  Filled 2024-01-31 (×2): qty 60, 30d supply, fill #0
  Filled 2024-02-27: qty 60, 30d supply, fill #1

## 2024-01-31 MED ORDER — DOXYCYCLINE HYCLATE 50 MG PO CAPS
50.0000 mg | ORAL_CAPSULE | Freq: Every day | ORAL | 1 refills | Status: DC
  Filled 2024-01-31: qty 30, 30d supply, fill #0
  Filled 2024-02-27: qty 30, 30d supply, fill #1

## 2024-02-01 ENCOUNTER — Encounter (HOSPITAL_COMMUNITY): Payer: Self-pay | Admitting: Physician Assistant

## 2024-02-01 ENCOUNTER — Ambulatory Visit (HOSPITAL_COMMUNITY)
Admission: RE | Admit: 2024-02-01 | Discharge: 2024-02-01 | Disposition: A | Discharge status: Home / Self Care | Source: Ambulatory Visit | Attending: Physician Assistant | Admitting: Physician Assistant

## 2024-02-01 ENCOUNTER — Ambulatory Visit (HOSPITAL_COMMUNITY): Admitting: Physician Assistant

## 2024-02-01 VITALS — BP 116/78 | HR 49 | Ht 74.0 in | Wt 216.2 lb

## 2024-02-01 DIAGNOSIS — Z5181 Encounter for therapeutic drug level monitoring: Secondary | ICD-10-CM | POA: Diagnosis not present

## 2024-02-01 DIAGNOSIS — R5382 Chronic fatigue, unspecified: Secondary | ICD-10-CM

## 2024-02-01 DIAGNOSIS — Z79899 Other long term (current) drug therapy: Secondary | ICD-10-CM | POA: Diagnosis not present

## 2024-02-01 DIAGNOSIS — I480 Paroxysmal atrial fibrillation: Secondary | ICD-10-CM

## 2024-02-01 NOTE — Progress Notes (Signed)
 Primary Care Physician: Everlina Hock, NP Primary Cardiologist: Nelia Balzarine, MD Electrophysiologist: Lei Pump, MD  Referring Physician: Dr Alvera Austria is a 53 y.o. male with a history of HTN, atrial flutter, atrial fibrillation who presents for follow up in the [facility].  The patient is s/p afib and flutter ablation with Dr Lawana Pray on 01/04/24. Patient is on Eliquis  for stroke prevention.   Patient presents today for follow up for atrial fibrillation. He reports that he has done well from an afib standpoint. He remains in SR. He states that his fatigue persists despite maintaining SR. He denies chest pain or groin issues.   Today, he denies symptoms of palpitations, chest pain, shortness of breath, orthopnea, PND, lower extremity edema, dizziness, presyncope, syncope, bleeding, or neurologic sequela. The patient is tolerating medications without difficulties and is otherwise without complaint today.    Atrial Fibrillation Risk Factors:  he does not have symptoms or diagnosis of sleep apnea. he does not have a history of rheumatic fever.   Atrial Fibrillation Management history:  Previous antiarrhythmic drugs: none Previous cardioversions: none Previous ablations: 01/04/24 Anticoagulation history: Eliquis   ROS- All systems are reviewed and negative except as per the HPI above.  Past Medical History:  Diagnosis Date   ACHILLES TENDINITIS 12/28/2008   Qualifier: Diagnosis of  By: Paulla Bossier MD, [name]     Actinic keratosis 03/14/2022   Acute appendicitis 07/31/2018   Acute frontal sinusitis 09/01/2008   Qualifier: Diagnosis of  By: Jalene Mayor DOAdel Holt     Alcohol use 09/20/2018   Allergic rhinitis 09/16/2014   Anal inflammation 03/08/2018   Asthma 09/13/2013   Backache 01/17/2007   Qualifier: Diagnosis of   By: Pierce Brewster MD, [name]      IMO SNOMED Dx Update Oct 2024     Basal cell carcinoma (BCC) of left shoulder  03/14/2022   Basal cell carcinoma of face 03/14/2022   Basal cell carcinoma of right popliteal region 03/14/2022   CHEST PAIN 06/29/2009   Qualifier: Diagnosis of  By: Paulla Bossier MD, [facility] WALL PAIN, ANTERIOR 09/22/2010   Qualifier: Diagnosis of  By: Paulla Bossier MD, [name]     Chronic pain of right ankle 09/20/2018   Decreased libido 12/14/2022   occasional ED.     Dyspnea on exertion 03/16/2022   Dysthymia 11/21/2018   Ectopic atrial rhythm 03/16/2022   Elevated blood sugar 09/20/2018   Elevated liver enzymes 07/19/2020   Essential hypertension 01/17/2007   Qualifier: Diagnosis of  By: Pierce Brewster MD, [name]     External hemorrhoids 03/31/2008   Qualifier: Diagnosis of  By: Neomi Banks MD, Anitra Ket.    GASTROENTERITIS 06/29/2009   Qualifier: Diagnosis of  By: Paulla Bossier MD, Rondall Codding medical examination 11/21/2011   GERD 06/29/2009   Qualifier: Diagnosis of  By: Paulla Bossier MD, [facility]     Healthcare maintenance 09/10/2017   Hemangioma of skin and subcutaneous tissue 03/14/2022   Hepatitis    unknown type many yrs ago - no residual problems   History of malignant neoplasm of skin 03/14/2022   Insomnia    Lapband APL + [facility] repair July 2013 04/24/2012   Left arm numbness 02/05/2014   Leg wound, left 09/16/2014   Lentigo 03/14/2022   Low testosterone  09/10/2017   Lump in neck 04/22/2014   Medically noncompliant 07/16/2020   Melanocytic nevi of left upper limb, including shoulder 03/14/2022   Melanocytic  nevi of trunk 03/14/2022   Milia 03/14/2022   Morbid obesity (HCC)    Neoplasm of uncertain behavior of skin 03/14/2022   Night sweat 11/21/2018   OBESITY 03/31/2008   Qualifier: Diagnosis of  By: Neomi Banks MD, [name]    Obesity (BMI 30.0-34.9) 03/16/2022   Ocular rosacea    Other seborrheic keratosis 03/14/2022   Other skin changes due to chronic exposure to nonionizing radiation 03/14/2022   Palpitations 03/16/2022   Post-nasal drip 07/16/2020   Pruritus ani 11/21/2018    Pseudofolliculitis barbae 03/14/2022   Vasomotor rhinitis 11/21/2018   Viral warts 03/14/2022   Vitamin D  deficiency 09/23/2018    Current Outpatient Medications  Medication Sig Dispense Refill   apixaban  (ELIQUIS ) 5 MG TABS tablet Take 1 tablet (5 mg total) by mouth 2 (two) times daily. 180 tablet 3   clindamycin  (CLEOCIN  T) 1 % external solution Apply 1 Application topically 2 (two) times daily. 60 mL 1   clindamycin  (CLEOCIN  T) 1 % external solution Apply 1 Application topically 2 (two) times daily. 60 mL 1   diltiazem  (CARDIZEM  CD) 120 MG 24 hr capsule Take 1 capsule (120 mg total) by mouth daily. 90 capsule 1   doxycycline  (VIBRAMYCIN ) 50 MG capsule Take 1 capsule (50 mg total) by mouth daily with food. 30 capsule 1   doxycycline  (VIBRAMYCIN ) 50 MG capsule Take 1 capsule (50 mg total) by mouth daily with food. 30 capsule 1   eszopiclone  (LUNESTA ) 2 MG TABS tablet Take 1 tablet (2 mg total) by mouth at bedtime. 90 tablet 0   flecainide  (TAMBOCOR ) 50 MG tablet Take 1 tablet (50 mg total) by mouth 2 (two) times daily. 180 tablet 3   hydrocortisone  (ANUSOL -HC) 25 MG suppository Unwrap and place 1 suppository (25 mg total) rectally 2 (two) times daily as needed for hemorrhoids or anal itching. 12 suppository 1   levocetirizine (XYZAL) 5 MG tablet Take 5 mg by mouth every evening.     sildenafil  (VIAGRA ) 100 MG tablet Take 0.5-1 tablets (50-100 mg total) by mouth daily as needed for erectile dysfunction. 14 tablet 5   testosterone  cypionate (DEPOTESTOSTERONE CYPIONATE) 200 MG/ML injection Inject 0.5 mLs (100 mg total) into the muscle once a week. 10 mL 0   triamcinolone  cream (KENALOG ) 0.5 % Apply 1 Application topically 2 (two) times daily as needed. 15 g 1   UNABLE TO FIND Take 1 capsule by mouth at bedtime. Med Name: Cortisol Manager     UNABLE TO FIND Take 1 capsule by mouth at bedtime. Med Name: Tri-Magnesium     UNABLE TO FIND Take 1 capsule by mouth every morning. Med Name: Methyl B12      UNABLE TO FIND Take 1 capsule by mouth every morning. Med Name: D3 10 and Vitamin K     No current facility-administered medications for this encounter.    Physical Exam: BP 116/78   Pulse (!) 49   Ht 6\' 2"  (1.88 m)   Wt 98.1 kg   BMI 27.76 kg/m   GEN: Well nourished, well developed in no acute distress CARDIAC: Regular rate and rhythm, no murmurs, rubs, gallops RESPIRATORY:  Clear to auscultation without rales, wheezing or rhonchi  ABDOMEN: Soft, non-tender, non-distended EXTREMITIES:  No edema; No deformity   Wt Readings from Last 3 Encounters:  02/01/24 98.1 kg  01/18/24 95.3 kg  01/04/24 99.8 kg     EKG today demonstrates  SB Vent. rate 49 BPM PR interval 142 ms QRS duration 90 ms  QT/QTcB 418/377 ms    CHA2DS2-VASc Score = 1  The patient's score is based upon: CHF History: 0 HTN History: 1 Diabetes History: 0 Stroke History: 0 Vascular Disease History: 0 Age Score: 0 Gender Score: 0       ASSESSMENT AND PLAN: Paroxysmal Atrial Fibrillation/atrial flutter The patient's CHA2DS2-VASc score is 1, indicating a 0.6% annual risk of stroke.   S/p afib and flutter ablation 01/04/24 Patient appears to be maintaining SR Continue Eliquis  5 mg BID with no missed doses for 3 months post ablation. Continue diltiazem  120 mg daily Continue flecainide  50 mg BID  High Risk Medication Monitoring (ICD 10: Z79.899) Intervals on ECG acceptable for flecainide  monitoring.      HTN Stable on current regimen  Fatigue  Patient has longstanding fatigue. Recent thyroid tests within normal limits. He is on B12 supplementation. He has a sleep consult next week. ? Related to untreated OSA. We discussed stopping diltiazem  and flecainide  today to see if bradycardia was contributing to his symptoms. He would like to continue his present medications for now.     Follow up with Dr Lawana Pray as scheduled.       Myrtha Ates PA-C [facility] [address] [place], [place] [address] [phone]

## 2024-02-04 DIAGNOSIS — I10 Essential (primary) hypertension: Secondary | ICD-10-CM | POA: Diagnosis not present

## 2024-02-04 DIAGNOSIS — G4733 Obstructive sleep apnea (adult) (pediatric): Secondary | ICD-10-CM | POA: Diagnosis not present

## 2024-02-04 DIAGNOSIS — I4891 Unspecified atrial fibrillation: Secondary | ICD-10-CM | POA: Diagnosis not present

## 2024-02-04 DIAGNOSIS — R002 Palpitations: Secondary | ICD-10-CM | POA: Diagnosis not present

## 2024-02-11 ENCOUNTER — Other Ambulatory Visit: Payer: Self-pay

## 2024-02-11 ENCOUNTER — Ambulatory Visit: Attending: Family Medicine | Admitting: Physical Therapy

## 2024-02-11 DIAGNOSIS — R2689 Other abnormalities of gait and mobility: Secondary | ICD-10-CM | POA: Insufficient documentation

## 2024-02-11 DIAGNOSIS — M6281 Muscle weakness (generalized): Secondary | ICD-10-CM | POA: Diagnosis not present

## 2024-02-11 DIAGNOSIS — G8929 Other chronic pain: Secondary | ICD-10-CM | POA: Diagnosis not present

## 2024-02-11 DIAGNOSIS — M5459 Other low back pain: Secondary | ICD-10-CM | POA: Insufficient documentation

## 2024-02-11 DIAGNOSIS — M5442 Lumbago with sciatica, left side: Secondary | ICD-10-CM | POA: Insufficient documentation

## 2024-02-11 NOTE — Therapy (Signed)
 OUTPATIENT PHYSICAL THERAPY THORACOLUMBAR EVALUATION   Patient Name: Gilbert [name] MRN: [id number] DOB:January 15, 1971, 53 y.o., male Today's Date: 02/11/2024  END OF SESSION:  PT End of Session - 02/11/24 0758     Visit Number 1    Number of Visits 7    Date for PT Re-Evaluation 03/24/24    Authorization Type [employer]    PT Start Time 0800    PT Stop Time 0840    PT Time Calculation (min) 40 min             Past Medical History:  Diagnosis Date   ACHILLES TENDINITIS 12/28/2008   Qualifier: Diagnosis of  By: Paulla Bossier MD, [name]     Actinic keratosis 03/14/2022   Acute appendicitis 07/31/2018   Acute frontal sinusitis 09/01/2008   Qualifier: Diagnosis of  By: Jalene Mayor DOAdel Holt     Alcohol use 09/20/2018   Allergic rhinitis 09/16/2014   Anal inflammation 03/08/2018   Asthma 09/13/2013   Backache 01/17/2007   Qualifier: Diagnosis of   By: Pierce Brewster MD, [name]      IMO SNOMED Dx Update Oct 2024     Basal cell carcinoma (BCC) of left shoulder 03/14/2022   Basal cell carcinoma of face 03/14/2022   Basal cell carcinoma of right popliteal region 03/14/2022   CHEST PAIN 06/29/2009   Qualifier: Diagnosis of  By: Paulla Bossier MD, [facility] WALL PAIN, ANTERIOR 09/22/2010   Qualifier: Diagnosis of  By: Paulla Bossier MD, [name]     Chronic pain of right ankle 09/20/2018   Decreased libido 12/14/2022   occasional ED.     Dyspnea on exertion 03/16/2022   Dysthymia 11/21/2018   Ectopic atrial rhythm 03/16/2022   Elevated blood sugar 09/20/2018   Elevated liver enzymes 07/19/2020   Essential hypertension 01/17/2007   Qualifier: Diagnosis of  By: Pierce Brewster MD, [name]     External hemorrhoids 03/31/2008   Qualifier: Diagnosis of  By: Neomi Banks MD, Anitra Ket.    GASTROENTERITIS 06/29/2009   Qualifier: Diagnosis of  By: Paulla Bossier MD, Rondall Codding medical examination 11/21/2011   GERD 06/29/2009   Qualifier: Diagnosis of  By: Paulla Bossier MD, [facility]     Healthcare maintenance 09/10/2017    Hemangioma of skin and subcutaneous tissue 03/14/2022   Hepatitis    unknown type many yrs ago - no residual problems   History of malignant neoplasm of skin 03/14/2022   Insomnia    Lapband APL + [facility] repair July 2013 04/24/2012   Left arm numbness 02/05/2014   Leg wound, left 09/16/2014   Lentigo 03/14/2022   Low testosterone  09/10/2017   Lump in neck 04/22/2014   Medically noncompliant 07/16/2020   Melanocytic nevi of left upper limb, including shoulder 03/14/2022   Melanocytic nevi of trunk 03/14/2022   Milia 03/14/2022   Morbid obesity (HCC)    Neoplasm of uncertain behavior of skin 03/14/2022   Night sweat 11/21/2018   OBESITY 03/31/2008   Qualifier: Diagnosis of  By: Neomi Banks MD, [name]    Obesity (BMI 30.0-34.9) 03/16/2022   Ocular rosacea    Other seborrheic keratosis 03/14/2022   Other skin changes due to chronic exposure to nonionizing radiation 03/14/2022   Palpitations 03/16/2022   Post-nasal drip 07/16/2020   Pruritus ani 11/21/2018   Pseudofolliculitis barbae 03/14/2022   Vasomotor rhinitis 11/21/2018   Viral warts 03/14/2022   Vitamin D  deficiency 09/23/2018   Past Surgical History:  Procedure Laterality Date   ANKLE ARTHROSCOPY WITH RECONSTRUCTION  Right 09/26/2018   Procedure: ANKLE DEBRIDEMENT WITH POSSIBLE LATERAL LIGAMENT  RECONSTRUCTION;  Surgeon: Donnamarie Gables, MD;  Location: [facility] OR;  Service: Orthopedics;  Laterality: Right;   ATRIAL FIBRILLATION ABLATION N/A 01/04/2024   Procedure: ATRIAL FIBRILLATION ABLATION;  Surgeon: Lei Pump, MD;  Location: [facility] INVASIVE CV LAB;  Service: Cardiovascular;  Laterality: N/A;   HIATAL HERNIA REPAIR  04/23/2012   Procedure: HERNIA REPAIR HIATAL;  Surgeon: Azucena Bollard, MD;  Location: [facility] ORS;  Service: General;  Laterality: N/A;   KNEE SURGERY Right    fracture knee cap   LAPAROSCOPIC APPENDECTOMY N/A 07/31/2018   Procedure: APPENDECTOMY LAPAROSCOPIC;  Surgeon: Caralyn Chandler, MD;  Location: [facility] ORS;  Service:  General;  Laterality: N/A;   LAPAROSCOPIC GASTRIC BANDING  04/23/2012   Procedure: LAPAROSCOPIC GASTRIC BANDING;  Surgeon: Azucena Bollard, MD;  Location: [facility] ORS;  Service: General;  Laterality: N/A;   LASIK     Patient Active Problem List   Diagnosis Date Noted   PAF (paroxysmal atrial fibrillation) (HCC) 09/04/2023   Decreased libido 12/14/2022   Ectopic atrial rhythm 03/16/2022   Obesity (BMI 30.0-34.9) 03/16/2022   Dyspnea on exertion 03/16/2022   Palpitations 03/16/2022   Hepatitis 03/14/2022   Morbid obesity (HCC) 03/14/2022   Ocular rosacea 03/14/2022   Basal cell carcinoma (BCC) of left shoulder 03/14/2022   Basal cell carcinoma of face 03/14/2022   Basal cell carcinoma of right popliteal region 03/14/2022   Hemangioma of skin and subcutaneous tissue 03/14/2022   History of malignant neoplasm of skin 03/14/2022   Lentigo 03/14/2022   Viral warts 03/14/2022   Melanocytic nevi of left upper limb, including shoulder 03/14/2022   Melanocytic nevi of trunk 03/14/2022   Milia 03/14/2022   Neoplasm of uncertain behavior of skin 03/14/2022   Actinic keratosis 03/14/2022   Other seborrheic keratosis 03/14/2022   Other skin changes due to chronic exposure to nonionizing radiation 03/14/2022   Pseudofolliculitis barbae 03/14/2022   Elevated liver enzymes 07/19/2020   Medically noncompliant 07/16/2020   Post-nasal drip 07/16/2020   Pruritus ani 11/21/2018   Dysthymia 11/21/2018   Vasomotor rhinitis 11/21/2018   Night sweat 11/21/2018   Vitamin D  deficiency 09/23/2018   Chronic pain of right ankle 09/20/2018   Elevated blood sugar 09/20/2018   Alcohol use 09/20/2018   Acute appendicitis 07/31/2018   Anal inflammation 03/08/2018   Healthcare maintenance 09/10/2017   Low testosterone  09/10/2017   Leg wound, left 09/16/2014   Allergic rhinitis 09/16/2014   Lump in neck 04/22/2014   Left arm numbness 02/05/2014   Asthma 09/13/2013   Lapband APL + [facility] repair July 2013  04/24/2012   General medical examination 11/21/2011   Insomnia 05/03/2011   CHEST WALL PAIN, ANTERIOR 09/22/2010   GERD 06/29/2009   GASTROENTERITIS 06/29/2009   CHEST PAIN 06/29/2009   ACHILLES TENDINITIS 12/28/2008   Acute frontal sinusitis 09/01/2008   OBESITY 03/31/2008   External hemorrhoids 03/31/2008   Essential hypertension 01/17/2007   Backache 01/17/2007    PCP: Everlina Hock, NP  REFERRING PROVIDER: Everlina Hock, NP  REFERRING DIAG: [phone] (ICD-10-CM) - Chronic bilateral low back pain with left-sided sciatica  Rationale for Evaluation and Treatment: Rehabilitation  THERAPY DIAG:  Other low back pain  Other abnormalities of gait and mobility  Muscle weakness (generalized)  ONSET DATE: At least 1 year  SUBJECTIVE:  SUBJECTIVE STATEMENT: ".Aaron AasAaron AasRecently I've lost some weight. I had surgery a couple years ago and plateaued. Last Summer I was walking and was in pretty good shape and lost more weight." Pt reports he had a recent ablation and is now cleared to return to more activity. Pt states his back pain comes and goes. Pt notes he helped his son moved and could barely walk. Sometimes it would go down his leg. Pt states he sat for a long period on a bench and when he stood up his back just felt tight. Unable to put his finger on specific examples that worsen his back. Gets massages frequently from his girlfriend which does help. Was working out with a trainer 3x/wk prior to his heart ablation (last seen on Thanksgiving) and was working on Emergency planning/management officer.   PERTINENT HISTORY:  s/p afib and flutter ablation HTN, atrial flutter, atrial fibrillation, weekly testosterone  injections  PAIN:  Are you having pain? Yes: NPRS scale: currently just a tightness; can get at worst 7 or  8 Pain location: Middle low back (maybe left worse) -- can radiate down occasionally R or L leg Pain description: tightness Aggravating factors: Not sure -- sometimes standing and walking Relieving factors: move around and stretch a little  PRECAUTIONS: None  RED FLAGS: None   WEIGHT BEARING RESTRICTIONS: No  FALLS:  Has patient fallen in last 6 months? 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  DIAGNOSTIC FINDINGS:  01/23/24 lumbar x-ray IMPRESSION: Mild degenerative joint changes at L5-S1.  PATIENT SURVEYS:  Modified Oswestry Low Back Pain Disability Questionnaire: 12 / 50 = 24.0 %  COGNITION: Overall cognitive status: Within functional limits for tasks assessed     SENSATION: WFL  MUSCLE LENGTH: Hamstrings: Right ~60 deg; Left ~60 deg Thomas test: Right ~10 deg; Left ~10 deg  POSTURE: Mild scoliosis with R iliac crest slightly higher than L  PALPATION: Mobile throughout thoracic and lumbar spine; tender to palpation and tight around lumbar paraspinals and QL  LUMBAR ROM:   AROM eval  Flexion 75%  Extension 100% dull ache  Right lateral flexion 1.5" above knee  Left lateral flexion 1.5" above knee  Right rotation 100%  Left rotation 100%   (Blank rows = not tested)  LOWER EXTREMITY ROM:     Active  Right eval Left eval  Hip flexion    Hip extension    Hip abduction    Hip adduction    Hip internal rotation    Hip external rotation    Knee flexion    Knee extension    Ankle dorsiflexion    Ankle plantarflexion    Ankle inversion    Ankle eversion     (Blank rows = not tested)  LOWER EXTREMITY MMT:    MMT Right eval Left eval  Hip flexion 4- 4-  Hip  extension 4- 4-  Hip abduction 3+ 3+  Hip adduction    Hip internal rotation 5 5  Hip external rotation 4+ 4+  Knee flexion 4 4  Knee extension 5 5  Ankle dorsiflexion    Ankle plantarflexion    Ankle inversion    Ankle eversion     (Blank rows = not tested)  LUMBAR SPECIAL  TESTS:  Straight leg raise test: Positive, FABER test: Negative, and Trendelenburg sign: Positive  FUNCTIONAL TESTS:  Double leg lowering test: ~50 deg Plank (forearm and knees) 2 min SLS: >1 min R & L, R more unstable than L  GAIT: Distance walked: Into clinic Assistive device utilized: None Level of assistance: Complete Independence Comments: Normal reciprocal pattern  TREATMENT DATE: 02/11/24 See HEP below                                                                                                                              Attempted bridging but pt feeling it in his back and not in glutes/hamstrings   PATIENT EDUCATION:  Education details: Exam findings, POC, initial HEP Person educated: Patient Education method: Explanation, Demonstration, and Handouts Education comprehension: verbalized understanding, returned demonstration, and needs further education  HOME EXERCISE PROGRAM: Access Code: [email or web address] URL: [email or web address] Date: 02/11/2024 Prepared by: Amirra Herling April Erman Hayward  Exercises - Seated Hamstring Stretch  - 1 x daily - 7 x weekly - 2 sets - 30 sec hold - Prone Hip Extension with Bent Knee  - 1 x daily - 7 x weekly - 3 sets - 10 reps - Clamshell with Resistance  - 1 x daily - 7 x weekly - 3 sets - 10 reps  ASSESSMENT:  CLINICAL IMPRESSION: Patient is a 53 y.o. M who was seen today for physical therapy evaluation and treatment for intermittent low back. Assessment is significant for hamstring and hip flexor tightness with weak posterior LE chain (mostly glutes) with limited lumbar ROM and increased muscle tightness in lumbar paraspinals. Trialed hip  strengthening exercises this session; able to feel good muscle contraction for hip abd but pt reports not feeling enough glute or hamstring activation with bridge or prone hip extension -- may need modifications next session. Pt will benefit from PT to address these issues to maximize his function to meet goals such as running.   OBJECTIVE IMPAIRMENTS: decreased activity tolerance, decreased coordination, decreased endurance, decreased mobility, decreased ROM, decreased strength, increased fascial restrictions, increased muscle spasms, impaired flexibility, improper body mechanics, and pain.   ACTIVITY LIMITATIONS: bending, sitting, standing, transfers, and locomotion level  PARTICIPATION LIMITATIONS: community activity and recreation  PERSONAL FACTORS: Age, Fitness, Past/current experiences, Profession, and Time since onset of injury/illness/exacerbation are also affecting patient's functional outcome.   REHAB POTENTIAL: Good  CLINICAL DECISION MAKING: Evolving/moderate complexity  EVALUATION COMPLEXITY: Moderate   GOALS: Goals reviewed with patient? Yes  SHORT TERM GOALS: Target date: 03/03/2024   Pt will be ind with initial HEP Baseline: Goal status: INITIAL  2.  Pt will demo improved hamstring length with a SLR of at least 70 deg Baseline:  Goal status: INITIAL  3.  Pt will demo improved hip flexor length with ability to obtain neutral hip ext during Andy Bannister Testing Baseline:  Goal status: INITIAL   LONG TERM GOALS: Target date: 03/24/2024   Pt will be ind with management and  progression of HEP Baseline:  Goal status: INITIAL  2.  Pt will be able to deadlift at least 25# with good body mechanics for increased glute and hamstring strength Baseline:  Goal status: INITIAL  3.  Pt will report improved overall pain by >/=50% (intensity, frequency and duration) Baseline:  Goal status: INITIAL  4.  Pt will have improved Modified Oswestry score to </=12% to demo  [facility] Baseline: Modified Oswestry Low Back Pain Disability Questionnaire: 12 / 50 = 24.0 % Goal status: INITIAL  5.  Pt will demo full and pain free lumbar ROM for improved tightness Baseline:  Goal status: INITIAL  6. Pt will be able to jog for at least 10 min without exacerbation of pain for pt's personal goals to start running Baseline:  Goal status: INITIAL   PLAN:  PT FREQUENCY: 1-2x/week  PT DURATION: 6 weeks  PLANNED INTERVENTIONS: [id number]- PT Re-evaluation, 97750- Physical Performance Testing, 97110-Therapeutic exercises, 97530- Therapeutic activity, 97112- Neuromuscular re-education, 97535- Self Care, [id number]- Manual therapy, [id number]- Gait training, [phone]- Aquatic Therapy, [phone]- Electrical stimulation (unattended), [phone]- Ionotophoresis 4mg /ml Dexamethasone , Patient/Family education, Balance training, Stair training, Taping, Dry Needling, Joint mobilization, Spinal mobilization, Cryotherapy, and Moist heat.  PLAN FOR NEXT SESSION: Assess and modify HEP accordingly. 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 Improve hamstring and hip flexibility.    Deziya Amero April Ma L Berry Godsey, PT, DPT 02/11/2024, 1:20 PM

## 2024-02-13 ENCOUNTER — Other Ambulatory Visit (HOSPITAL_BASED_OUTPATIENT_CLINIC_OR_DEPARTMENT_OTHER): Payer: Self-pay

## 2024-02-15 ENCOUNTER — Other Ambulatory Visit (HOSPITAL_BASED_OUTPATIENT_CLINIC_OR_DEPARTMENT_OTHER): Payer: Self-pay

## 2024-02-25 ENCOUNTER — Ambulatory Visit: Attending: Family Medicine | Admitting: Physical Therapy

## 2024-02-25 ENCOUNTER — Encounter: Payer: Self-pay | Admitting: Physical Therapy

## 2024-02-25 DIAGNOSIS — M6281 Muscle weakness (generalized): Secondary | ICD-10-CM

## 2024-02-25 DIAGNOSIS — R2689 Other abnormalities of gait and mobility: Secondary | ICD-10-CM | POA: Diagnosis not present

## 2024-02-25 DIAGNOSIS — I4891 Unspecified atrial fibrillation: Secondary | ICD-10-CM | POA: Diagnosis not present

## 2024-02-25 DIAGNOSIS — R002 Palpitations: Secondary | ICD-10-CM | POA: Diagnosis not present

## 2024-02-25 DIAGNOSIS — G4733 Obstructive sleep apnea (adult) (pediatric): Secondary | ICD-10-CM | POA: Diagnosis not present

## 2024-02-25 DIAGNOSIS — M5459 Other low back pain: Secondary | ICD-10-CM | POA: Diagnosis not present

## 2024-02-25 DIAGNOSIS — I10 Essential (primary) hypertension: Secondary | ICD-10-CM | POA: Diagnosis not present

## 2024-02-25 NOTE — Therapy (Signed)
 OUTPATIENT PHYSICAL THERAPY TREATMENT   Patient Name: Gilbert [name] MRN: [id number] DOB:1971-02-11, 53 y.o., male Today's Date: 02/25/2024  END OF SESSION:  PT End of Session - 02/25/24 0838     Visit Number 2    Number of Visits 7    Date for PT Re-Evaluation 03/24/24    Authorization Type [employer]    PT Start Time 0840    PT Stop Time 0920    PT Time Calculation (min) 40 min    Activity Tolerance Patient tolerated treatment well              Past Medical History:  Diagnosis Date   ACHILLES TENDINITIS 12/28/2008   Qualifier: Diagnosis of  By: Paulla Bossier MD, [name]     Actinic keratosis 03/14/2022   Acute appendicitis 07/31/2018   Acute frontal sinusitis 09/01/2008   Qualifier: Diagnosis of  By: Tracy Friedlander     Alcohol use 09/20/2018   Allergic rhinitis 09/16/2014   Anal inflammation 03/08/2018   Asthma 09/13/2013   Backache 01/17/2007   Qualifier: Diagnosis of   By: Pierce Brewster MD, [name]      IMO SNOMED Dx Update Oct 2024     Basal cell carcinoma (BCC) of left shoulder 03/14/2022   Basal cell carcinoma of face 03/14/2022   Basal cell carcinoma of right popliteal region 03/14/2022   CHEST PAIN 06/29/2009   Qualifier: Diagnosis of  By: Paulla Bossier MD, [facility] WALL PAIN, ANTERIOR 09/22/2010   Qualifier: Diagnosis of  By: Paulla Bossier MD, [name]     Chronic pain of right ankle 09/20/2018   Decreased libido 12/14/2022   occasional ED.     Dyspnea on exertion 03/16/2022   Dysthymia 11/21/2018   Ectopic atrial rhythm 03/16/2022   Elevated blood sugar 09/20/2018   Elevated liver enzymes 07/19/2020   Essential hypertension 01/17/2007   Qualifier: Diagnosis of  By: Pierce Brewster MD, [name]     External hemorrhoids 03/31/2008   Qualifier: Diagnosis of  By: Neomi Banks MD, Anitra Ket.    GASTROENTERITIS 06/29/2009   Qualifier: Diagnosis of  By: Paulla Bossier MD, Rondall Codding medical examination 11/21/2011   GERD 06/29/2009   Qualifier: Diagnosis of  By: Paulla Bossier MD, [facility]      Healthcare maintenance 09/10/2017   Hemangioma of skin and subcutaneous tissue 03/14/2022   Hepatitis    unknown type many yrs ago - no residual problems   History of malignant neoplasm of skin 03/14/2022   Insomnia    Lapband APL + [facility] repair July 2013 04/24/2012   Left arm numbness 02/05/2014   Leg wound, left 09/16/2014   Lentigo 03/14/2022   Low testosterone  09/10/2017   Lump in neck 04/22/2014   Medically noncompliant 07/16/2020   Melanocytic nevi of left upper limb, including shoulder 03/14/2022   Melanocytic nevi of trunk 03/14/2022   Milia 03/14/2022   Morbid obesity (HCC)    Neoplasm of uncertain behavior of skin 03/14/2022   Night sweat 11/21/2018   OBESITY 03/31/2008   Qualifier: Diagnosis of  By: Neomi Banks MD, [name]    Obesity (BMI 30.0-34.9) 03/16/2022   Ocular rosacea    Other seborrheic keratosis 03/14/2022   Other skin changes due to chronic exposure to nonionizing radiation 03/14/2022   Palpitations 03/16/2022   Post-nasal drip 07/16/2020   Pruritus ani 11/21/2018   Pseudofolliculitis barbae 03/14/2022   Vasomotor rhinitis 11/21/2018   Viral warts 03/14/2022   Vitamin D  deficiency 09/23/2018   Past Surgical History:  Procedure Laterality Date   ANKLE ARTHROSCOPY WITH RECONSTRUCTION Right 09/26/2018   Procedure: ANKLE DEBRIDEMENT WITH POSSIBLE LATERAL LIGAMENT  RECONSTRUCTION;  Surgeon: Donnamarie Gables, MD;  Location: [facility] OR;  Service: Orthopedics;  Laterality: Right;   ATRIAL FIBRILLATION ABLATION N/A 01/04/2024   Procedure: ATRIAL FIBRILLATION ABLATION;  Surgeon: Lei Pump, MD;  Location: [facility] INVASIVE CV LAB;  Service: Cardiovascular;  Laterality: N/A;   HIATAL HERNIA REPAIR  04/23/2012   Procedure: HERNIA REPAIR HIATAL;  Surgeon: Azucena Bollard, MD;  Location: [facility] ORS;  Service: General;  Laterality: N/A;   KNEE SURGERY Right    fracture knee cap   LAPAROSCOPIC APPENDECTOMY N/A 07/31/2018   Procedure: APPENDECTOMY LAPAROSCOPIC;  Surgeon: Caralyn Chandler, MD;  Location: [facility] ORS;  Service: General;  Laterality: N/A;   LAPAROSCOPIC GASTRIC BANDING  04/23/2012   Procedure: LAPAROSCOPIC GASTRIC BANDING;  Surgeon: Azucena Bollard, MD;  Location: [facility] ORS;  Service: General;  Laterality: N/A;   LASIK     Patient Active Problem List   Diagnosis Date Noted   PAF (paroxysmal atrial fibrillation) (HCC) 09/04/2023   Decreased libido 12/14/2022   Ectopic atrial rhythm 03/16/2022   Obesity (BMI 30.0-34.9) 03/16/2022   Dyspnea on exertion 03/16/2022   Palpitations 03/16/2022   Hepatitis 03/14/2022   Morbid obesity (HCC) 03/14/2022   Ocular rosacea 03/14/2022   Basal cell carcinoma (BCC) of left shoulder 03/14/2022   Basal cell carcinoma of face 03/14/2022   Basal cell carcinoma of right popliteal region 03/14/2022   Hemangioma of skin and subcutaneous tissue 03/14/2022   History of malignant neoplasm of skin 03/14/2022   Lentigo 03/14/2022   Viral warts 03/14/2022   Melanocytic nevi of left upper limb, including shoulder 03/14/2022   Melanocytic nevi of trunk 03/14/2022   Milia 03/14/2022   Neoplasm of uncertain behavior of skin 03/14/2022   Actinic keratosis 03/14/2022   Other seborrheic keratosis 03/14/2022   Other skin changes due to chronic exposure to nonionizing radiation 03/14/2022   Pseudofolliculitis barbae 03/14/2022   Elevated liver enzymes 07/19/2020   Medically noncompliant 07/16/2020   Post-nasal drip 07/16/2020   Pruritus ani 11/21/2018   Dysthymia 11/21/2018   Vasomotor rhinitis 11/21/2018   Night sweat 11/21/2018   Vitamin D  deficiency 09/23/2018   Chronic pain of right ankle 09/20/2018   Elevated blood sugar 09/20/2018   Alcohol use 09/20/2018   Acute appendicitis 07/31/2018   Anal inflammation 03/08/2018   Healthcare maintenance 09/10/2017   Low testosterone  09/10/2017   Leg wound, left 09/16/2014   Allergic rhinitis 09/16/2014   Lump in neck 04/22/2014   Left arm numbness 02/05/2014   Asthma 09/13/2013    Lapband APL + HH repair July 2013 04/24/2012   General medical examination 11/21/2011   Insomnia 05/03/2011   CHEST WALL PAIN, ANTERIOR 09/22/2010   GERD 06/29/2009   GASTROENTERITIS 06/29/2009   CHEST PAIN 06/29/2009   ACHILLES TENDINITIS 12/28/2008   Acute frontal sinusitis 09/01/2008   OBESITY 03/31/2008   External hemorrhoids 03/31/2008   Essential hypertension 01/17/2007   Backache 01/17/2007    PCP: Everlina Hock, NP  REFERRING PROVIDER: Everlina Hock, NP  REFERRING DIAG: [phone] (ICD-10-CM) - Chronic bilateral low back pain with left-sided sciatica  Rationale for Evaluation and Treatment: Rehabilitation  THERAPY DIAG:  Other low back pain  Other abnormalities of gait and mobility  Muscle weakness (generalized)  ONSET DATE: At least 1 year  SUBJECTIVE:  SUBJECTIVE STATEMENT: Pt reports no real changes. Has not been able to do his exercises. Has done some of the hamstring stretching. Reports he has been busy with moving and work.   From eval: ".Aaron AasAaron AasRecently I've lost some weight. I had surgery a couple years ago and plateaued. Last Summer I was walking and was in pretty good shape and lost more weight." Pt reports he had a recent ablation and is now cleared to return to more activity. Pt states his back pain comes and goes. Pt notes he helped his son moved and could barely walk. Sometimes it would go down his leg. Pt states he sat for a long period on a bench and when he stood up his back just felt tight. Unable to put his finger on specific examples that worsen his back. Gets massages frequently from his girlfriend which does help. Was working out with a trainer 3x/wk prior to his heart ablation (last seen on Thanksgiving) and was working on Emergency planning/management officer.   PERTINENT HISTORY:   s/p afib and flutter ablation HTN, atrial flutter, atrial fibrillation, weekly testosterone  injections  PAIN:  Are you having pain? Yes: NPRS scale: 4 or 5 out of 10; can get at worst 7 or 8 Pain location: Middle low back (maybe left worse) -- can radiate down occasionally R or L leg Pain description: tightness Aggravating factors: Not sure -- sometimes standing and walking Relieving factors: move around and stretch a little  PRECAUTIONS: None  RED FLAGS: None   WEIGHT BEARING RESTRICTIONS: No  FALLS:  Has patient fallen in last 6 months? 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  DIAGNOSTIC FINDINGS:  01/23/24 lumbar x-ray IMPRESSION: Mild degenerative joint changes at L5-S1.  PATIENT SURVEYS:  Modified Oswestry Low Back Pain Disability Questionnaire: 12 / 50 = 24.0 %  COGNITION: Overall cognitive status: Within functional limits for tasks assessed     SENSATION: WFL  MUSCLE LENGTH: Hamstrings: Right ~60 deg; Left ~60 deg Thomas test: Right ~10 deg; Left ~10 deg  POSTURE: Mild scoliosis with R iliac crest slightly higher than L  PALPATION: Mobile throughout thoracic and lumbar spine; tender to palpation and tight around lumbar paraspinals and QL  LUMBAR ROM:   AROM eval  Flexion 75%  Extension 100% dull ache  Right lateral flexion 1.5" above knee  Left lateral flexion 1.5" above knee  Right rotation 100%  Left rotation 100%   (Blank rows = not tested)  LOWER EXTREMITY ROM:     Active  Right eval Left eval  Hip flexion    Hip extension    Hip abduction    Hip adduction    Hip internal rotation    Hip external rotation    Knee flexion    Knee extension    Ankle  dorsiflexion    Ankle plantarflexion    Ankle inversion    Ankle eversion     (Blank rows = not tested)  LOWER EXTREMITY MMT:    MMT Right eval Left eval  Hip flexion 4- 4-  Hip extension 4- 4-  Hip abduction 3+ 3+  Hip adduction    Hip internal rotation 5 5  Hip external rotation 4+ 4+  Knee flexion 4 4  Knee extension 5 5  Ankle dorsiflexion    Ankle plantarflexion    Ankle inversion    Ankle eversion     (Blank rows = not tested)  LUMBAR SPECIAL TESTS:  Straight leg raise test: Positive, FABER test: Negative, and Trendelenburg sign: Positive  FUNCTIONAL TESTS:  Double leg lowering test: ~50 deg Plank (forearm and knees) 2 min SLS: >1 min R & L, R more unstable than L  GAIT: Distance walked: Into clinic Assistive device utilized: None Level of assistance: Complete Independence Comments: Normal reciprocal pattern  TREATMENT DATE:  02/25/24 Seated hamstring stretch x 30" Supine hip flexor stretch 2x 30" Figure 4 stretch x 30" LTR 2x30" PPT 15x5" Sidelying clamshell red TB 2x10 Prone 1/2 on mat table hip extension 2x10 Supine hip extension/bridge off EOB 2x10   02/11/24 See HEP below                                                                                                                              Attempted bridging but pt feeling it in his back and not in glutes/hamstrings   PATIENT EDUCATION:  Education details: Exam findings, POC, initial HEP Person educated: Patient Education method: Explanation, Demonstration, and Handouts Education comprehension: verbalized understanding, returned demonstration, and needs further education  HOME EXERCISE PROGRAM: Access Code: [email or web address] URL: [email or web address] Date: 02/25/2024 Prepared by: Patton Rabinovich April Erman Hayward  Exercises - Seated Hamstring Stretch  - 1 x daily - 7 x weekly - 2 sets - 30 sec hold - Supine Lower Trunk Rotation  - 1 x daily - 7 x weekly - 2 sets - 30 sec hold - Thomas  Stretch on Table  - 1 x daily - 7 x weekly - 2 sets - 30 sec hold - Supine Posterior Pelvic Tilt  - 1 x daily - 7 x weekly - 1 sets - 15 reps - 5 sec hold - Clamshell with Resistance  - 1 x daily - 7 x weekly - 3 sets - 10 reps - Prone Hip Extension on Table  - 1 x daily - 7 x weekly - 2-3 sets - 10 reps - Supine Hip Extension on Bench  - 1 x daily - 7 x weekly - 2-3 sets - 10 reps  ASSESSMENT:  CLINICAL IMPRESSION: Treatment focused on improving hip and hamstring muscle shortening causing pulling/tightness into pt's low back. Reviewed pt's HEP and focused on initiating core and hip strengthening. Improved feeling of glute contraction with stabilizing leg during prone hip ext on table. May benefit from more lumbar paraspinal stretches next session and progressing core strengthening.   From eval: Patient is a 53 y.o. M who was seen today for physical therapy evaluation and treatment for intermittent low back. Assessment is significant for hamstring and hip flexor tightness with weak posterior LE chain (mostly glutes) with limited lumbar ROM and increased muscle tightness in lumbar paraspinals. Trialed hip strengthening exercises this session; able to feel good muscle  contraction for hip abd but pt reports not feeling enough glute or hamstring activation with bridge or prone hip extension -- may need modifications next session. Pt will benefit from PT to address these issues to maximize his function to meet goals such as running.   OBJECTIVE IMPAIRMENTS: decreased activity tolerance, decreased coordination, decreased endurance, decreased mobility, decreased ROM, decreased strength, increased fascial restrictions, increased muscle spasms, impaired flexibility, improper body mechanics, and pain.     GOALS: Goals reviewed with patient? Yes  SHORT TERM GOALS: Target date: 03/03/2024   Pt will be ind with initial HEP Baseline: Goal status: INITIAL  2.  Pt will demo improved hamstring length with a  SLR of at least 70 deg Baseline:  Goal status: INITIAL  3.  Pt will demo improved hip flexor length with ability to obtain neutral hip ext during Andy Bannister Testing Baseline:  Goal status: INITIAL   LONG TERM GOALS: Target date: 03/24/2024   Pt will be ind with management and progression of HEP Baseline:  Goal status: INITIAL  2.  Pt will be able to deadlift at least 25# with good body mechanics for increased glute and hamstring strength Baseline:  Goal status: INITIAL  3.  Pt will report improved overall pain by >/=50% (intensity, frequency and duration) Baseline:  Goal status: INITIAL  4.  Pt will have improved Modified Oswestry score to </=12% to demo MCID Baseline: Modified Oswestry Low Back Pain Disability Questionnaire: 12 / 50 = 24.0 % Goal status: INITIAL  5.  Pt will demo full and pain free lumbar ROM for improved tightness Baseline:  Goal status: INITIAL  6. Pt will be able to jog for at least 10 min without exacerbation of pain for pt's personal goals to start running Baseline:  Goal status: INITIAL   PLAN:  PT FREQUENCY: 1-2x/week  PT DURATION: 6 weeks  PLANNED INTERVENTIONS: 97164- PT Re-evaluation, 97750- Physical Performance Testing, 97110-Therapeutic exercises, 97530- Therapeutic activity, 97112- Neuromuscular re-education, 97535- Self Care, [id number]- Manual therapy, [id number]- Gait training, [phone]- Aquatic Therapy, [phone]- Electrical stimulation (unattended), [phone]- Ionotophoresis 4mg /ml Dexamethasone , Patient/Family education, Balance training, Stair training, Taping, Dry Needling, Joint mobilization, Spinal mobilization, Cryotherapy, and Moist heat.  PLAN FOR NEXT SESSION: Assess and modify HEP accordingly. 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 Improve lumbar, hamstring and hip flexibility. Progress core strengthening.    Lincy Belles April Ma L Maedell Hedger, PT, DPT 02/25/2024, 8:39 AM

## 2024-02-26 ENCOUNTER — Other Ambulatory Visit (HOSPITAL_BASED_OUTPATIENT_CLINIC_OR_DEPARTMENT_OTHER): Payer: Self-pay

## 2024-02-26 MED ORDER — TESTOSTERONE CYPIONATE 200 MG/ML IM SOLN
100.0000 mg | INTRAMUSCULAR | 0 refills | Status: DC
  Filled 2024-02-26 – 2024-02-27 (×2): qty 4, 28d supply, fill #0

## 2024-02-27 ENCOUNTER — Other Ambulatory Visit (HOSPITAL_BASED_OUTPATIENT_CLINIC_OR_DEPARTMENT_OTHER): Payer: Self-pay

## 2024-02-27 ENCOUNTER — Other Ambulatory Visit: Payer: Self-pay

## 2024-02-28 ENCOUNTER — Other Ambulatory Visit (HOSPITAL_BASED_OUTPATIENT_CLINIC_OR_DEPARTMENT_OTHER): Payer: Self-pay

## 2024-03-03 DIAGNOSIS — G4700 Insomnia, unspecified: Secondary | ICD-10-CM | POA: Diagnosis not present

## 2024-03-03 DIAGNOSIS — I4891 Unspecified atrial fibrillation: Secondary | ICD-10-CM | POA: Diagnosis not present

## 2024-03-03 DIAGNOSIS — G4733 Obstructive sleep apnea (adult) (pediatric): Secondary | ICD-10-CM | POA: Diagnosis not present

## 2024-03-04 ENCOUNTER — Encounter: Payer: Self-pay | Admitting: Family Medicine

## 2024-03-04 ENCOUNTER — Ambulatory Visit

## 2024-03-04 DIAGNOSIS — G4730 Sleep apnea, unspecified: Secondary | ICD-10-CM

## 2024-03-04 HISTORY — DX: Sleep apnea, unspecified: G47.30

## 2024-03-05 ENCOUNTER — Other Ambulatory Visit (HOSPITAL_BASED_OUTPATIENT_CLINIC_OR_DEPARTMENT_OTHER): Payer: Self-pay

## 2024-03-05 DIAGNOSIS — L700 Acne vulgaris: Secondary | ICD-10-CM | POA: Diagnosis not present

## 2024-03-05 DIAGNOSIS — B079 Viral wart, unspecified: Secondary | ICD-10-CM | POA: Diagnosis not present

## 2024-03-05 DIAGNOSIS — L739 Follicular disorder, unspecified: Secondary | ICD-10-CM | POA: Diagnosis not present

## 2024-03-05 DIAGNOSIS — B078 Other viral warts: Secondary | ICD-10-CM | POA: Diagnosis not present

## 2024-03-05 MED ORDER — DOXYCYCLINE HYCLATE 50 MG PO CAPS
50.0000 mg | ORAL_CAPSULE | Freq: Every day | ORAL | 3 refills | Status: DC
  Filled 2024-03-05 – 2024-03-30 (×2): qty 30, 30d supply, fill #0

## 2024-03-05 MED ORDER — CLINDAMYCIN PHOSPHATE 1 % EX SOLN
1.0000 | Freq: Two times a day (BID) | CUTANEOUS | 1 refills | Status: DC
  Filled 2024-03-05: qty 60, 30d supply, fill #0

## 2024-03-07 ENCOUNTER — Ambulatory Visit: Admitting: Physical Therapy

## 2024-03-07 ENCOUNTER — Encounter: Payer: Self-pay | Admitting: Physical Therapy

## 2024-03-07 ENCOUNTER — Other Ambulatory Visit (HOSPITAL_BASED_OUTPATIENT_CLINIC_OR_DEPARTMENT_OTHER): Payer: Self-pay

## 2024-03-07 DIAGNOSIS — R2689 Other abnormalities of gait and mobility: Secondary | ICD-10-CM

## 2024-03-07 DIAGNOSIS — M5459 Other low back pain: Secondary | ICD-10-CM

## 2024-03-07 DIAGNOSIS — M6281 Muscle weakness (generalized): Secondary | ICD-10-CM

## 2024-03-07 NOTE — Therapy (Signed)
 OUTPATIENT PHYSICAL THERAPY TREATMENT   Patient Name: Gilbert [name] MRN: [id number] DOB:1971-05-30, 53 y.o., male Today's Date: 03/07/2024  END OF SESSION:  PT End of Session - 03/07/24 0845     Visit Number 3    Number of Visits 7    Date for PT Re-Evaluation 03/24/24    Authorization Type BC[employer]    PT Start Time 0845    PT Stop Time 0930    PT Time Calculation (min) 45 min    Activity Tolerance Patient tolerated treatment well    Behavior During Therapy [facility] for tasks assessed/performed           Past Medical History:  Diagnosis Date   ACHILLES TENDINITIS 12/28/2008   Qualifier: Diagnosis of  By: Paulla Bossier MD, [name]     Actinic keratosis 03/14/2022   Acute appendicitis 07/31/2018   Acute frontal sinusitis 09/01/2008   Qualifier: Diagnosis of  By: Jalene Mayor DOAdel Holt     Alcohol use 09/20/2018   Allergic rhinitis 09/16/2014   Anal inflammation 03/08/2018   Asthma 09/13/2013   Backache 01/17/2007   Qualifier: Diagnosis of   By: Pierce Brewster MD, [name]      IMO SNOMED Dx Update Oct 2024     Basal cell carcinoma (BCC) of left shoulder 03/14/2022   Basal cell carcinoma of face 03/14/2022   Basal cell carcinoma of right popliteal region 03/14/2022   CHEST PAIN 06/29/2009   Qualifier: Diagnosis of  By: Paulla Bossier MD, [facility] WALL PAIN, ANTERIOR 09/22/2010   Qualifier: Diagnosis of  By: Paulla Bossier MD, [name]     Chronic pain of right ankle 09/20/2018   Decreased libido 12/14/2022   occasional ED.     Dyspnea on exertion 03/16/2022   Dysthymia 11/21/2018   Ectopic atrial rhythm 03/16/2022   Elevated blood sugar 09/20/2018   Elevated liver enzymes 07/19/2020   Essential hypertension 01/17/2007   Qualifier: Diagnosis of  By: Pierce Brewster MD, [name]     External hemorrhoids 03/31/2008   Qualifier: Diagnosis of  By: Neomi Banks MD, Anitra Ket.    GASTROENTERITIS 06/29/2009   Qualifier: Diagnosis of  By: Paulla Bossier MD, Rondall Codding medical examination 11/21/2011   GERD 06/29/2009    Qualifier: Diagnosis of  By: Paulla Bossier MD, [facility]     Healthcare maintenance 09/10/2017   Hemangioma of skin and subcutaneous tissue 03/14/2022   Hepatitis    unknown type many yrs ago - no residual problems   History of malignant neoplasm of skin 03/14/2022   Insomnia    Lapband APL + [facility] repair July 2013 04/24/2012   Left arm numbness 02/05/2014   Leg wound, left 09/16/2014   Lentigo 03/14/2022   Low testosterone  09/10/2017   Lump in neck 04/22/2014   Medically noncompliant 07/16/2020   Melanocytic nevi of left upper limb, including shoulder 03/14/2022   Melanocytic nevi of trunk 03/14/2022   Mild sleep apnea 03/04/2024   02/08/24 Home sleep study ([facility]): AHI 5   Milia 03/14/2022   Morbid obesity (HCC)    Neoplasm of uncertain behavior of skin 03/14/2022   Night sweat 11/21/2018   OBESITY 03/31/2008   Qualifier: Diagnosis of  By: Neomi Banks MD, [name]    Obesity (BMI 30.0-34.9) 03/16/2022   Ocular rosacea    Other seborrheic keratosis 03/14/2022   Other skin changes due to chronic exposure to nonionizing radiation 03/14/2022   Palpitations 03/16/2022   Post-nasal drip 07/16/2020   Pruritus ani 11/21/2018   Pseudofolliculitis  barbae 03/14/2022   Vasomotor rhinitis 11/21/2018   Viral warts 03/14/2022   Vitamin D  deficiency 09/23/2018   Past Surgical History:  Procedure Laterality Date   ANKLE ARTHROSCOPY WITH RECONSTRUCTION Right 09/26/2018   Procedure: ANKLE DEBRIDEMENT WITH POSSIBLE LATERAL LIGAMENT  RECONSTRUCTION;  Surgeon: Donnamarie Gables, MD;  Location: [facility] OR;  Service: Orthopedics;  Laterality: Right;   ATRIAL FIBRILLATION ABLATION N/A 01/04/2024   Procedure: ATRIAL FIBRILLATION ABLATION;  Surgeon: Lei Pump, MD;  Location: [facility] INVASIVE CV LAB;  Service: Cardiovascular;  Laterality: N/A;   HIATAL HERNIA REPAIR  04/23/2012   Procedure: HERNIA REPAIR HIATAL;  Surgeon: Azucena Bollard, MD;  Location: [facility] ORS;  Service: General;  Laterality: N/A;   KNEE  SURGERY Right    fracture knee cap   LAPAROSCOPIC APPENDECTOMY N/A 07/31/2018   Procedure: APPENDECTOMY LAPAROSCOPIC;  Surgeon: Caralyn Chandler, MD;  Location: [facility] ORS;  Service: General;  Laterality: N/A;   LAPAROSCOPIC GASTRIC BANDING  04/23/2012   Procedure: LAPAROSCOPIC GASTRIC BANDING;  Surgeon: Azucena Bollard, MD;  Location: [facility] ORS;  Service: General;  Laterality: N/A;   LASIK     Patient Active Problem List   Diagnosis Date Noted   Mild sleep apnea 03/04/2024   PAF (paroxysmal atrial fibrillation) (HCC) 09/04/2023   Decreased libido 12/14/2022   Ectopic atrial rhythm 03/16/2022   Obesity (BMI 30.0-34.9) 03/16/2022   Dyspnea on exertion 03/16/2022   Palpitations 03/16/2022   Hepatitis 03/14/2022   Morbid obesity (HCC) 03/14/2022   Ocular rosacea 03/14/2022   Basal cell carcinoma (BCC) of left shoulder 03/14/2022   Basal cell carcinoma of face 03/14/2022   Basal cell carcinoma of right popliteal region 03/14/2022   Hemangioma of skin and subcutaneous tissue 03/14/2022   History of malignant neoplasm of skin 03/14/2022   Lentigo 03/14/2022   Viral warts 03/14/2022   Melanocytic nevi of left upper limb, including shoulder 03/14/2022   Melanocytic nevi of trunk 03/14/2022   Milia 03/14/2022   Neoplasm of uncertain behavior of skin 03/14/2022   Actinic keratosis 03/14/2022   Other seborrheic keratosis 03/14/2022   Other skin changes due to chronic exposure to nonionizing radiation 03/14/2022   Pseudofolliculitis barbae 03/14/2022   Elevated liver enzymes 07/19/2020   Medically noncompliant 07/16/2020   Post-nasal drip 07/16/2020   Pruritus ani 11/21/2018   Dysthymia 11/21/2018   Vasomotor rhinitis 11/21/2018   Night sweat 11/21/2018   Vitamin D  deficiency 09/23/2018   Chronic pain of right ankle 09/20/2018   Elevated blood sugar 09/20/2018   Alcohol use 09/20/2018   Acute appendicitis 07/31/2018   Anal inflammation 03/08/2018   Healthcare maintenance 09/10/2017   Low  testosterone  09/10/2017   Leg wound, left 09/16/2014   Allergic rhinitis 09/16/2014   Lump in neck 04/22/2014   Left arm numbness 02/05/2014   Asthma 09/13/2013   Lapband APL + HH repair July 2013 04/24/2012   General medical examination 11/21/2011   Insomnia 05/03/2011   CHEST WALL PAIN, ANTERIOR 09/22/2010   GERD 06/29/2009   GASTROENTERITIS 06/29/2009   CHEST PAIN 06/29/2009   ACHILLES TENDINITIS 12/28/2008   Acute frontal sinusitis 09/01/2008   OBESITY 03/31/2008   External hemorrhoids 03/31/2008   Essential hypertension 01/17/2007   Backache 01/17/2007    PCP: Everlina Hock, NP  REFERRING PROVIDER: Everlina Hock, NP  REFERRING DIAG: [phone] (ICD-10-CM) - Chronic bilateral low back pain with left-sided sciatica  Rationale for Evaluation and Treatment: Rehabilitation  THERAPY DIAG:  Other low back pain  Other  abnormalities of gait and mobility  Muscle weakness (generalized)  ONSET DATE: At least 1 year  SUBJECTIVE:                                                                                                                                                                                           SUBJECTIVE STATEMENT: Pt reports no real changes. Continues to report that the he is not doing the stretching very well, he does report trying to train for a running race  From eval: .Aaron AasAaron AasRecently I've lost some weight. I had surgery a couple years ago and plateaued. Last Summer I was walking and was in pretty good shape and lost more weight. Pt reports he had a recent ablation and is now cleared to return to more activity. Pt states his back pain comes and goes. Pt notes he helped his son moved and could barely walk. Sometimes it would go down his leg. Pt states he sat for a long period on a bench and when he stood up his back just felt tight. Unable to put his finger on specific examples that worsen his back. Gets massages frequently from his girlfriend which does  help. Was working out with a trainer 3x/wk prior to his heart ablation (last seen on Thanksgiving) and was working on Emergency planning/management officer.   PERTINENT HISTORY:  s/p afib and flutter ablation HTN, atrial flutter, atrial fibrillation, weekly testosterone  injections  PAIN:  Are you having pain? Yes: NPRS scale: 4 or 5 out of 10; can get at worst 7 or 8 Pain location: Middle low back (maybe left worse) -- can radiate down occasionally R or L leg Pain description: tightness Aggravating factors: Not sure -- sometimes standing and walking Relieving factors: move around and stretch a little  PRECAUTIONS: None  RED FLAGS: None   WEIGHT BEARING RESTRICTIONS: No  FALLS:  Has patient fallen in last 6 months? 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  DIAGNOSTIC FINDINGS:  01/23/24 lumbar x-ray IMPRESSION: Mild degenerative joint changes at L5-S1.  PATIENT SURVEYS:  Modified Oswestry Low Back Pain Disability Questionnaire: 12 / 50 = 24.0 %  COGNITION: Overall cognitive status: Within functional limits for tasks assessed     SENSATION: WFL  MUSCLE LENGTH: Hamstrings: Right ~60 deg; Left ~60 deg Thomas test: Right ~10 deg; Left ~10 deg  POSTURE: Mild scoliosis with  R iliac crest slightly higher than L  PALPATION: Mobile throughout thoracic and lumbar spine; tender to palpation and tight around lumbar paraspinals and QL  LUMBAR ROM:   AROM eval  Flexion 75%  Extension 100% dull ache  Right lateral flexion 1.5 above knee  Left lateral flexion 1.5 above knee  Right rotation 100%  Left rotation 100%   (Blank rows = not tested)  LOWER EXTREMITY ROM:     Active  Right eval Left eval   Hip flexion    Hip extension    Hip abduction    Hip adduction    Hip internal rotation    Hip external rotation    Knee flexion    Knee extension    Ankle dorsiflexion    Ankle plantarflexion    Ankle inversion    Ankle eversion     (Blank rows = not tested)  LOWER EXTREMITY MMT:    MMT Right eval Left eval  Hip flexion 4- 4-  Hip extension 4- 4-  Hip abduction 3+ 3+  Hip adduction    Hip internal rotation 5 5  Hip external rotation 4+ 4+  Knee flexion 4 4  Knee extension 5 5  Ankle dorsiflexion    Ankle plantarflexion    Ankle inversion    Ankle eversion     (Blank rows = not tested)  LUMBAR SPECIAL TESTS:  Straight leg raise test: Positive, FABER test: Negative, and Trendelenburg sign: Positive  FUNCTIONAL TESTS:  Double leg lowering test: ~50 deg Plank (forearm and knees) 2 min SLS: >1 min R & L, R more unstable than L  GAIT: Distance walked: Into clinic Assistive device utilized: None Level of assistance: Complete Independence Comments: Normal reciprocal pattern  TREATMENT DATE:  03/07/24 Nustep level 5 x 6 minute Rows 25# 2x10 Lats 25# 2x10 LEg curls 25# 2x10 Green tband hip extension and abduction 2x10 Supine Feet on ball K2C, rotation, bridge, isometric abs Green tband clamshells Passive stretch LE's  Updated HEP see below  02/25/24 Seated hamstring stretch x 30 Supine hip flexor stretch 2x 30 Figure 4 stretch x 30 LTR 2x30 PPT 15x5 Sidelying clamshell red TB 2x10 Prone 1/2 on mat table hip extension 2x10 Supine hip extension/bridge off EOB 2x10   02/11/24 See HEP below                                                                                                                              Attempted bridging but pt feeling it in his back and not in glutes/hamstrings   PATIENT EDUCATION:  Education details: Exam findings, POC, initial HEP Person educated: Patient Education method: Explanation, Demonstration, and  Handouts Education comprehension: verbalized understanding, returned demonstration, and needs further education  HOME EXERCISE PROGRAM: Access Code: [email or web address] URL: [email or web address] Date: 02/25/2024 Prepared by: Gellen April Marie Nonato  Exercises - Seated Hamstring Stretch  - 1 x daily - 7 x weekly - 2 sets - 30 sec  hold - Supine Lower Trunk Rotation  - 1 x daily - 7 x weekly - 2 sets - 30 sec hold - Thomas Stretch on Table  - 1 x daily - 7 x weekly - 2 sets - 30 sec hold - Supine Posterior Pelvic Tilt  - 1 x daily - 7 x weekly - 1 sets - 15 reps - 5 sec hold - Clamshell with Resistance  - 1 x daily - 7 x weekly - 3 sets - 10 reps - Prone Hip Extension on Table  - 1 x daily - 7 x weekly - 2-3 sets - 10 reps - Supine Hip Extension on Bench  - 1 x daily - 7 x weekly - 2-3 sets - 10 reps Access Code: [email or web address] URL: [email or web address] Date: 03/07/2024 Prepared by: Cherylene Corrente  Exercises - Standing Hip Extension with Anchored Resistance  - 1 x daily - 7 x weekly - 3 sets - 10 reps - 3 hold - Standing Hip Abduction with Anchored Resistance  - 1 x daily - 7 x weekly - 3 sets - 10 reps - 3 hold - Supine Hamstring Stretch with Doorway  - 1 x daily - 7 x weekly - 3 sets - 10 reps - 30 hold - Seated Hamstring Stretch with Chair  - 1 x daily - 7 x weekly - 3 sets - 10 reps - 30 hold - Supine Piriformis Stretch Pulling Heel to Hip  - 1 x daily - 7 x weekly - 3 sets - 10 reps - 20 hold - Gastroc Stretch on Wall  - 1 x daily - 7 x weekly - 3 sets - 10 reps - 30 hold - Supine Double Knee to Chest  - 1 x daily - 7 x weekly - 3 sets - 10 reps - 10 hold - Supine Lower Trunk Rotation  - 1 x daily - 7 x weekly - 3 sets - 10 reps - 10 hold ASSESSMENT:  CLINICAL IMPRESSION: Treatment focused on improving hip and hamstring muscle shortening causing pulling/tightness into pt's low back. Added to  pt's HEP and focused on more flexibility and added some strengthening,  he did well but is very tight in the low back mms. Started in the gym. Needed verbal and tactile cues for all exercises for form and core activation for some neuro re education of controlling his body.  May benefit from more lumbar paraspinal stretches next session and progressing core strengthening.   From eval: Patient is a 52 y.o. M who was seen today for physical therapy evaluation and treatment for intermittent low back. Assessment is significant for hamstring and hip flexor tightness with weak posterior LE chain (mostly glutes) with limited lumbar ROM and increased muscle tightness in lumbar paraspinals. Trialed hip strengthening exercises this session; able to feel good muscle contraction for hip abd but pt reports not feeling enough glute or hamstring activation with bridge or prone hip extension -- may need modifications next session. Pt will benefit from PT to address these issues to maximize his function to meet goals such as running.   OBJECTIVE IMPAIRMENTS: decreased activity tolerance, decreased coordination, decreased endurance, decreased mobility, decreased ROM, decreased strength, increased fascial restrictions, increased muscle spasms, impaired flexibility, improper body mechanics, and pain.     GOALS: Goals reviewed with patient? Yes  SHORT TERM GOALS: Target date: 03/03/2024   Pt will be ind with initial HEP Baseline: Goal status: still struggling with doing HEP  2.  Pt will demo improved hamstring  length with a SLR of at least 70 deg Baseline:  Goal status: progressing 03/07/24  3.  Pt will demo improved hip flexor length with ability to obtain neutral hip ext during Andy Bannister Testing Baseline:  Goal status: INITIAL   LONG TERM GOALS: Target date: 03/24/2024   Pt will be ind with management and progression of HEP Baseline:  Goal status: INITIAL  2.  Pt will be able to deadlift at least 25# with good body mechanics for increased glute and hamstring strength Baseline:   Goal status: INITIAL  3.  Pt will report improved overall pain by >/=50% (intensity, frequency and duration) Baseline:  Goal status: INITIAL  4.  Pt will have improved Modified Oswestry score to </=12% to demo MCID Baseline: Modified Oswestry Low Back Pain Disability Questionnaire: 12 / 50 = 24.0 % Goal status: INITIAL  5.  Pt will demo full and pain free lumbar ROM for improved tightness Baseline:  Goal status: INITIAL  6. Pt will be able to jog for at least 10 min without exacerbation of pain for pt's personal goals to start running Baseline:  Goal status: INITIAL   PLAN:  PT FREQUENCY: 1-2x/week  PT DURATION: 6 weeks  PLANNED INTERVENTIONS: 97164- PT Re-evaluation, 97750- Physical Performance Testing, 97110-Therapeutic exercises, 97530- Therapeutic activity, 97112- Neuromuscular re-education, 97535- Self Care, [id number]- Manual therapy, [id number]- Gait training, [phone]- Aquatic Therapy, [phone]- Electrical stimulation (unattended), [phone]- Ionotophoresis 4mg /ml Dexamethasone , Patient/Family education, Balance training, Stair training, Taping, Dry Needling, Joint mobilization, Spinal mobilization, Cryotherapy, and Moist heat.  PLAN FOR NEXT SESSION: Assess and modify HEP accordingly. 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 Improve lumbar, hamstring and hip flexibility. Progress core strengthening.    Farhana Fellows W, PT, DPT 03/07/2024, 8:45 AM

## 2024-03-10 ENCOUNTER — Other Ambulatory Visit (HOSPITAL_BASED_OUTPATIENT_CLINIC_OR_DEPARTMENT_OTHER): Payer: Self-pay

## 2024-03-10 ENCOUNTER — Other Ambulatory Visit: Payer: Self-pay

## 2024-03-10 DIAGNOSIS — E559 Vitamin D deficiency, unspecified: Secondary | ICD-10-CM | POA: Diagnosis not present

## 2024-03-10 DIAGNOSIS — E291 Testicular hypofunction: Secondary | ICD-10-CM | POA: Diagnosis not present

## 2024-03-10 DIAGNOSIS — D510 Vitamin B12 deficiency anemia due to intrinsic factor deficiency: Secondary | ICD-10-CM | POA: Diagnosis not present

## 2024-03-10 DIAGNOSIS — R7982 Elevated C-reactive protein (CRP): Secondary | ICD-10-CM | POA: Diagnosis not present

## 2024-03-10 DIAGNOSIS — Z0000 Encounter for general adult medical examination without abnormal findings: Secondary | ICD-10-CM | POA: Diagnosis not present

## 2024-03-10 DIAGNOSIS — G4700 Insomnia, unspecified: Secondary | ICD-10-CM | POA: Diagnosis not present

## 2024-03-12 ENCOUNTER — Other Ambulatory Visit (HOSPITAL_BASED_OUTPATIENT_CLINIC_OR_DEPARTMENT_OTHER): Payer: Self-pay

## 2024-03-13 ENCOUNTER — Ambulatory Visit

## 2024-03-13 DIAGNOSIS — M5459 Other low back pain: Secondary | ICD-10-CM | POA: Diagnosis not present

## 2024-03-13 DIAGNOSIS — R2689 Other abnormalities of gait and mobility: Secondary | ICD-10-CM | POA: Diagnosis not present

## 2024-03-13 DIAGNOSIS — M6281 Muscle weakness (generalized): Secondary | ICD-10-CM | POA: Diagnosis not present

## 2024-03-13 NOTE — Therapy (Signed)
 OUTPATIENT PHYSICAL THERAPY TREATMENT   Patient Name: Gilbert [name] MRN: [id number] DOB:[birth year], 53 y.o., male Today's Date: 03/13/2024  END OF SESSION:  PT End of Session - 03/13/24 0808     Visit Number 4    Number of Visits 7    Date for PT Re-Evaluation 03/24/24    Authorization Type BCBS    PT Start Time 0802    PT Stop Time 0845    PT Time Calculation (min) 43 min    Activity Tolerance Patient tolerated treatment well    Behavior During Therapy [facility] for tasks assessed/performed            Past Medical History:  Diagnosis Date   ACHILLES TENDINITIS 12/28/2008   Qualifier: Diagnosis of  By: Paulla Bossier MD, [name]     Actinic keratosis 03/14/2022   Acute appendicitis 07/31/2018   Acute frontal sinusitis 09/01/2008   Qualifier: Diagnosis of  By: Jalene Mayor DOAdel Holt     Alcohol use 09/20/2018   Allergic rhinitis 09/16/2014   Anal inflammation 03/08/2018   Asthma 09/13/2013   Backache 01/17/2007   Qualifier: Diagnosis of   By: Pierce Brewster MD, [name]      IMO SNOMED Dx Update Oct 2024     Basal cell carcinoma (BCC) of left shoulder 03/14/2022   Basal cell carcinoma of face 03/14/2022   Basal cell carcinoma of right popliteal region 03/14/2022   CHEST PAIN 06/29/2009   Qualifier: Diagnosis of  By: Paulla Bossier MD, [facility] WALL PAIN, ANTERIOR 09/22/2010   Qualifier: Diagnosis of  By: Paulla Bossier MD, [name]     Chronic pain of right ankle 09/20/2018   Decreased libido 12/14/2022   occasional ED.     Dyspnea on exertion 03/16/2022   Dysthymia 11/21/2018   Ectopic atrial rhythm 03/16/2022   Elevated blood sugar 09/20/2018   Elevated liver enzymes 07/19/2020   Essential hypertension 01/17/2007   Qualifier: Diagnosis of  By: Pierce Brewster MD, [name]     External hemorrhoids 03/31/2008   Qualifier: Diagnosis of  By: Neomi Banks MD, Anitra Ket.    GASTROENTERITIS 06/29/2009   Qualifier: Diagnosis of  By: Paulla Bossier MD, Rondall Codding medical examination 11/21/2011   GERD  06/29/2009   Qualifier: Diagnosis of  By: Paulla Bossier MD, [facility]     Healthcare maintenance 09/10/2017   Hemangioma of skin and subcutaneous tissue 03/14/2022   Hepatitis    unknown type many yrs ago - no residual problems   History of malignant neoplasm of skin 03/14/2022   Insomnia    Lapband APL + [facility] repair July 2013 04/24/2012   Left arm numbness 02/05/2014   Leg wound, left 09/16/2014   Lentigo 03/14/2022   Low testosterone  09/10/2017   Lump in neck 04/22/2014   Medically noncompliant 07/16/2020   Melanocytic nevi of left upper limb, including shoulder 03/14/2022   Melanocytic nevi of trunk 03/14/2022   Mild sleep apnea 03/04/2024   02/08/24 Home sleep study ([facility]): AHI 5   Milia 03/14/2022   Morbid obesity (HCC)    Neoplasm of uncertain behavior of skin 03/14/2022   Night sweat 11/21/2018   OBESITY 03/31/2008   Qualifier: Diagnosis of  By: Neomi Banks MD, [name]    Obesity (BMI 30.0-34.9) 03/16/2022   Ocular rosacea    Other seborrheic keratosis 03/14/2022   Other skin changes due to chronic exposure to nonionizing radiation 03/14/2022   Palpitations 03/16/2022   Post-nasal drip 07/16/2020   Pruritus ani 11/21/2018  Pseudofolliculitis barbae 03/14/2022   Vasomotor rhinitis 11/21/2018   Viral warts 03/14/2022   Vitamin D  deficiency 09/23/2018   Past Surgical History:  Procedure Laterality Date   ANKLE ARTHROSCOPY WITH RECONSTRUCTION Right 09/26/2018   Procedure: ANKLE DEBRIDEMENT WITH POSSIBLE LATERAL LIGAMENT  RECONSTRUCTION;  Surgeon: Donnamarie Gables, MD;  Location: [facility] OR;  Service: Orthopedics;  Laterality: Right;   ATRIAL FIBRILLATION ABLATION N/A 01/04/2024   Procedure: ATRIAL FIBRILLATION ABLATION;  Surgeon: Lei Pump, MD;  Location: [facility] INVASIVE CV LAB;  Service: Cardiovascular;  Laterality: N/A;   HIATAL HERNIA REPAIR  04/23/2012   Procedure: HERNIA REPAIR HIATAL;  Surgeon: Azucena Bollard, MD;  Location: [facility] ORS;  Service: General;  Laterality:  N/A;   KNEE SURGERY Right    fracture knee cap   LAPAROSCOPIC APPENDECTOMY N/A 07/31/2018   Procedure: APPENDECTOMY LAPAROSCOPIC;  Surgeon: Caralyn Chandler, MD;  Location: [facility] ORS;  Service: General;  Laterality: N/A;   LAPAROSCOPIC GASTRIC BANDING  04/23/2012   Procedure: LAPAROSCOPIC GASTRIC BANDING;  Surgeon: Azucena Bollard, MD;  Location: [facility] ORS;  Service: General;  Laterality: N/A;   LASIK     Patient Active Problem List   Diagnosis Date Noted   Mild sleep apnea 03/04/2024   PAF (paroxysmal atrial fibrillation) (HCC) 09/04/2023   Decreased libido 12/14/2022   Ectopic atrial rhythm 03/16/2022   Obesity (BMI 30.0-34.9) 03/16/2022   Dyspnea on exertion 03/16/2022   Palpitations 03/16/2022   Hepatitis 03/14/2022   Morbid obesity (HCC) 03/14/2022   Ocular rosacea 03/14/2022   Basal cell carcinoma (BCC) of left shoulder 03/14/2022   Basal cell carcinoma of face 03/14/2022   Basal cell carcinoma of right popliteal region 03/14/2022   Hemangioma of skin and subcutaneous tissue 03/14/2022   History of malignant neoplasm of skin 03/14/2022   Lentigo 03/14/2022   Viral warts 03/14/2022   Melanocytic nevi of left upper limb, including shoulder 03/14/2022   Melanocytic nevi of trunk 03/14/2022   Milia 03/14/2022   Neoplasm of uncertain behavior of skin 03/14/2022   Actinic keratosis 03/14/2022   Other seborrheic keratosis 03/14/2022   Other skin changes due to chronic exposure to nonionizing radiation 03/14/2022   Pseudofolliculitis barbae 03/14/2022   Elevated liver enzymes 07/19/2020   Medically noncompliant 07/16/2020   Post-nasal drip 07/16/2020   Pruritus ani 11/21/2018   Dysthymia 11/21/2018   Vasomotor rhinitis 11/21/2018   Night sweat 11/21/2018   Vitamin D  deficiency 09/23/2018   Chronic pain of right ankle 09/20/2018   Elevated blood sugar 09/20/2018   Alcohol use 09/20/2018   Acute appendicitis 07/31/2018   Anal inflammation 03/08/2018   Healthcare maintenance  09/10/2017   Low testosterone  09/10/2017   Leg wound, left 09/16/2014   Allergic rhinitis 09/16/2014   Lump in neck 04/22/2014   Left arm numbness 02/05/2014   Asthma 09/13/2013   Lapband APL + HH repair July 2013 04/24/2012   General medical examination 11/21/2011   Insomnia 05/03/2011   CHEST WALL PAIN, ANTERIOR 09/22/2010   GERD 06/29/2009   GASTROENTERITIS 06/29/2009   CHEST PAIN 06/29/2009   ACHILLES TENDINITIS 12/28/2008   Acute frontal sinusitis 09/01/2008   OBESITY 03/31/2008   External hemorrhoids 03/31/2008   Essential hypertension 01/17/2007   Backache 01/17/2007    PCP: Everlina Hock, NP  REFERRING PROVIDER: Everlina Hock, NP  REFERRING DIAG: [id number] (ICD-10-CM) - Chronic bilateral low back pain with left-sided sciatica  Rationale for Evaluation and Treatment: Rehabilitation  THERAPY DIAG:  Other low back pain  Other abnormalities of gait and mobility  Muscle weakness (generalized)  ONSET DATE: At least 1 year  SUBJECTIVE:                                                                                                                                                                                           SUBJECTIVE STATEMENT: Pt reports still sore, last session went fine  From eval: .Aaron AasAaron AasRecently I've lost some weight. I had surgery a couple years ago and plateaued. Last Summer I was walking and was in pretty good shape and lost more weight. Pt reports he had a recent ablation and is now cleared to return to more activity. Pt states his back pain comes and goes. Pt notes he helped his son moved and could barely walk. Sometimes it would go down his leg. Pt states he sat for a long period on a bench and when he stood up his back just felt tight. Unable to put his finger on specific examples that worsen his back. Gets massages frequently from his girlfriend which does help. Was working out with a trainer 3x/wk prior to his heart ablation (last seen on  Thanksgiving) and was working on Emergency planning/management officer.   PERTINENT HISTORY:  s/p afib and flutter ablation HTN, atrial flutter, atrial fibrillation, weekly testosterone  injections  PAIN:  Are you having pain? Yes: NPRS scale: 4 or 5 out of 10; can get at worst 7 or 8 Pain location: Middle low back (maybe left worse) -- can radiate down occasionally R or L leg Pain description: tightness Aggravating factors: Not sure -- sometimes standing and walking Relieving factors: move around and stretch a little  PRECAUTIONS: None  RED FLAGS: None   WEIGHT BEARING RESTRICTIONS: No  FALLS:  Has patient fallen in last 6 months? 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  DIAGNOSTIC FINDINGS:  01/23/24 lumbar x-ray IMPRESSION: Mild degenerative joint changes at L5-S1.  PATIENT SURVEYS:  Modified Oswestry Low Back Pain Disability Questionnaire: 12 / 50 = 24.0 %  COGNITION: Overall cognitive status: Within functional limits for tasks assessed     SENSATION: WFL  MUSCLE LENGTH: Hamstrings: Right ~60 deg; Left ~60 deg Thomas test: Right ~10 deg; Left ~10 deg  POSTURE: Mild scoliosis with R iliac crest slightly higher than L  PALPATION: Mobile throughout thoracic and lumbar spine; tender to palpation and  tight around lumbar paraspinals and QL  LUMBAR ROM:   AROM eval  Flexion 75%  Extension 100% dull ache  Right lateral flexion 1.5 above knee  Left lateral flexion 1.5 above knee  Right rotation 100%  Left rotation 100%   (Blank rows = not tested)  LOWER EXTREMITY ROM:     Active  Right eval Left eval  Hip flexion    Hip extension    Hip abduction    Hip adduction    Hip internal  rotation    Hip external rotation    Knee flexion    Knee extension    Ankle dorsiflexion    Ankle plantarflexion    Ankle inversion    Ankle eversion     (Blank rows = not tested)  LOWER EXTREMITY MMT:    MMT Right eval Left eval  Hip flexion 4- 4-  Hip extension 4- 4-  Hip abduction 3+ 3+  Hip adduction    Hip internal rotation 5 5  Hip external rotation 4+ 4+  Knee flexion 4 4  Knee extension 5 5  Ankle dorsiflexion    Ankle plantarflexion    Ankle inversion    Ankle eversion     (Blank rows = not tested)  LUMBAR SPECIAL TESTS:  Straight leg raise test: Positive, FABER test: Negative, and Trendelenburg sign: Positive  FUNCTIONAL TESTS:  Double leg lowering test: ~50 deg Plank (forearm and knees) 2 min SLS: >1 min R & L, R more unstable than L  GAIT: Distance walked: Into clinic Assistive device utilized: None Level of assistance: Complete Independence Comments: Normal reciprocal pattern  TREATMENT DATE:  03/13/24 Nustep level 5 x 6 minute Seated lumbar flexion green ball 2x30 Supine HS stretch w/ strap x 30 Supine piriformis stretch KTOS and figure 4 x 30 B Leg press 25lb 2x10  Pallof press blue TB 2x10 B Trunk rotation blue TB 2x10 B Foam roll to low back and glutes 03/07/24 Nustep level 5 x 6 minute Rows 25# 2x10 Lats 25# 2x10 LEg curls 25# 2x10 Green tband hip extension and abduction 2x10 Supine Feet on ball K2C, rotation, bridge, isometric abs Green tband clamshells Passive stretch LE's Updated HEP see below  02/25/24 Seated hamstring stretch x 30 Supine hip flexor stretch 2x 30 Figure 4 stretch x 30 LTR 2x30 PPT 15x5 Sidelying clamshell red TB 2x10 Prone 1/2 on mat table hip extension 2x10 Supine hip extension/bridge off EOB 2x10   02/11/24 See HEP below                                                                                                                              Attempted bridging but pt feeling it in his back and  not in glutes/hamstrings   PATIENT EDUCATION:  Education details: Exam findings, POC, initial HEP Person educated: Patient Education method: Explanation, Demonstration, and Handouts Education comprehension: verbalized understanding, returned demonstration, and needs further education  HOME EXERCISE PROGRAM:  Access Code: [email or web address] URL: [email or web address] Date: 02/25/2024 Prepared by: Gellen April Marie Nonato  Exercises - Seated Hamstring Stretch  - 1 x daily - 7 x weekly - 2 sets - 30 sec hold - Supine Lower Trunk Rotation  - 1 x daily - 7 x weekly - 2 sets - 30 sec hold - Thomas Stretch on Table  - 1 x daily - 7 x weekly - 2 sets - 30 sec hold - Supine Posterior Pelvic Tilt  - 1 x daily - 7 x weekly - 1 sets - 15 reps - 5 sec hold - Clamshell with Resistance  - 1 x daily - 7 x weekly - 3 sets - 10 reps - Prone Hip Extension on Table  - 1 x daily - 7 x weekly - 2-3 sets - 10 reps - Supine Hip Extension on Bench  - 1 x daily - 7 x weekly - 2-3 sets - 10 reps Access Code: [email or web address] URL: [email or web address] Date: 03/13/2024 Prepared by: Virginio Isidore  Exercises - Standing Hip Extension with Anchored Resistance  - 1 x daily - 7 x weekly - 3 sets - 10 reps - 3 hold - Standing Hip Abduction with Anchored Resistance  - 1 x daily - 7 x weekly - 3 sets - 10 reps - 3 hold - Supine Hamstring Stretch with Doorway  - 1 x daily - 7 x weekly - 3 sets - 10 reps - 30 hold - Seated Hamstring Stretch with Chair  - 1 x daily - 7 x weekly - 3 sets - 10 reps - 30 hold - Supine Piriformis Stretch Pulling Heel to Hip  - 1 x daily - 7 x weekly - 3 sets - 10 reps - 20 hold - Gastroc Stretch on Wall  - 1 x daily - 7 x weekly - 3 sets - 10 reps - 30 hold - Supine Double Knee to Chest  - 1 x daily - 7 x weekly - 3 sets - 10 reps - 10 hold - Supine Lower Trunk Rotation  - 1 x daily - 7 x weekly - 3 sets - 10 reps - 10 hold - Supine Piriformis Stretch with Foot on Ground  - 2 x  daily - 7 x weekly - 2 sets - 2 reps - 30 sec hold - Supine Figure 4 Piriformis Stretch  - 2 x daily - 7 x weekly - 2 sets - 2 reps - 30 sec hold - Supine Hamstring Stretch with Strap  - 2 x daily - 7 x weekly - 2 sets - 2 reps - 30 sec hold ASSESSMENT:  CLINICAL IMPRESSION: Pt has continued tightness along the posterior chain, so we continued focusing on flexibility. We tried some hip hinges but he felt it too much in his low back. He did like the supine stretches so we will add these to HEP. Cues provided to correct form as needed.   From eval: Patient is a 53 y.o. M who was seen today for physical therapy evaluation and treatment for intermittent low back. Assessment is significant for hamstring and hip flexor tightness with weak posterior LE chain (mostly glutes) with limited lumbar ROM and increased muscle tightness in lumbar paraspinals. Trialed hip strengthening exercises this session; able to feel good muscle contraction for hip abd but pt reports not feeling enough glute or hamstring activation with bridge or prone hip extension -- may need modifications next session. Pt will benefit from PT to address these issues to maximize his  function to meet goals such as running.   OBJECTIVE IMPAIRMENTS: decreased activity tolerance, decreased coordination, decreased endurance, decreased mobility, decreased ROM, decreased strength, increased fascial restrictions, increased muscle spasms, impaired flexibility, improper body mechanics, and pain.     GOALS: Goals reviewed with patient? Yes  SHORT TERM GOALS: Target date: 03/03/2024   Pt will be ind with initial HEP Baseline: Goal status: MET- 03/13/24  2.  Pt will demo improved hamstring length with a SLR of at least 70 deg Baseline:  Goal status: progressing 03/07/24  3.  Pt will demo improved hip flexor length with ability to obtain neutral hip ext during Andy Bannister Testing Baseline:  Goal status: INITIAL   LONG TERM GOALS: Target date:  03/24/2024   Pt will be ind with management and progression of HEP Baseline:  Goal status: INITIAL  2.  Pt will be able to deadlift at least 25# with good body mechanics for increased glute and hamstring strength Baseline:  Goal status: IN PROGRESS- LBP with hip hinge motion  3.  Pt will report improved overall pain by >/=50% (intensity, frequency and duration) Baseline:  Goal status: INITIAL  4.  Pt will have improved Modified Oswestry score to </=12% to demo MCID Baseline: Modified Oswestry Low Back Pain Disability Questionnaire: 12 / 50 = 24.0 % Goal status: INITIAL  5.  Pt will demo full and pain free lumbar ROM for improved tightness Baseline:  Goal status: INITIAL  6. Pt will be able to jog for at least 10 min without exacerbation of pain for pt's personal goals to start running Baseline:  Goal status: INITIAL   PLAN:  PT FREQUENCY: 1-2x/week  PT DURATION: 6 weeks  PLANNED INTERVENTIONS: 97164- PT Re-evaluation, 97750- Physical Performance Testing, 97110-Therapeutic exercises, 97530- Therapeutic activity, 97112- Neuromuscular re-education, 97535- Self Care, 69629- Manual therapy, [id number]- Gait training, [phone]- Aquatic Therapy, [phone]- Electrical stimulation (unattended), [phone]- Ionotophoresis 4mg /ml Dexamethasone , Patient/Family education, Balance training, Stair training, Taping, Dry Needling, Joint mobilization, Spinal mobilization, Cryotherapy, and Moist heat.  PLAN FOR NEXT SESSION: Assess and modify HEP accordingly. 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 Improve lumbar, hamstring and hip flexibility. Progress core strengthening.    Marielouise Amey L Inessa Wardrop, PTA 03/13/2024, 8:45 AM

## 2024-03-17 ENCOUNTER — Encounter: Admitting: Physical Therapy

## 2024-03-19 ENCOUNTER — Other Ambulatory Visit (HOSPITAL_BASED_OUTPATIENT_CLINIC_OR_DEPARTMENT_OTHER): Payer: Self-pay

## 2024-03-19 MED ORDER — BUPROPION HCL ER (XL) 150 MG PO TB24
150.0000 mg | ORAL_TABLET | Freq: Every morning | ORAL | 2 refills | Status: AC
  Filled 2024-03-19: qty 90, 90d supply, fill #0
  Filled 2024-06-12: qty 90, 90d supply, fill #1
  Filled 2024-09-11: qty 30, 30d supply, fill #2
  Filled 2024-10-18: qty 30, 30d supply, fill #3
  Filled 2024-10-29: qty 30, 30d supply, fill #4

## 2024-03-20 ENCOUNTER — Other Ambulatory Visit: Payer: Self-pay

## 2024-03-20 ENCOUNTER — Other Ambulatory Visit (HOSPITAL_BASED_OUTPATIENT_CLINIC_OR_DEPARTMENT_OTHER): Payer: Self-pay

## 2024-03-20 MED ORDER — TESTOSTERONE CYPIONATE 200 MG/ML IM SOLN
50.0000 mg | INTRAMUSCULAR | 0 refills | Status: DC
  Filled 2024-03-20: qty 10, 70d supply, fill #0

## 2024-03-20 MED ORDER — ESZOPICLONE 2 MG PO TABS
2.0000 mg | ORAL_TABLET | Freq: Every day | ORAL | 5 refills | Status: DC
  Filled 2024-03-20: qty 30, 30d supply, fill #0
  Filled 2024-04-08 – 2024-04-20 (×3): qty 30, 30d supply, fill #1
  Filled 2024-04-24 – 2024-05-19 (×2): qty 30, 30d supply, fill #2
  Filled 2024-06-01 – 2024-06-15 (×2): qty 30, 30d supply, fill #3
  Filled 2024-07-19: qty 30, 30d supply, fill #4
  Filled 2024-08-05 – 2024-08-18 (×2): qty 30, 30d supply, fill #5

## 2024-03-21 ENCOUNTER — Other Ambulatory Visit (HOSPITAL_BASED_OUTPATIENT_CLINIC_OR_DEPARTMENT_OTHER): Payer: Self-pay

## 2024-03-26 ENCOUNTER — Ambulatory Visit: Attending: Family Medicine | Admitting: Rehabilitation

## 2024-03-26 DIAGNOSIS — M5459 Other low back pain: Secondary | ICD-10-CM | POA: Insufficient documentation

## 2024-03-26 DIAGNOSIS — M6281 Muscle weakness (generalized): Secondary | ICD-10-CM | POA: Insufficient documentation

## 2024-03-26 DIAGNOSIS — R2689 Other abnormalities of gait and mobility: Secondary | ICD-10-CM | POA: Insufficient documentation

## 2024-03-26 NOTE — Therapy (Deleted)
 OUTPATIENT PHYSICAL THERAPY TREATMENT/RECERTIFICATION Progress Note  Reporting Period 02/11/24 to 03/24/24  See note below for Objective Data and Assessment of Progress/Goals.      Patient Name: Gilbert [name] MRN: [id number] DOB:[birth year], 53 y.o., male Today's Date: 03/26/2024  END OF SESSION:      Past Medical History:  Diagnosis Date   ACHILLES TENDINITIS 12/28/2008   Qualifier: Diagnosis of  By: [name], [place]     Actinic keratosis 03/14/2022   Acute appendicitis 07/31/2018   Acute frontal sinusitis 09/01/2008   Qualifier: Diagnosis of  By: [name]     Alcohol use 09/20/2018   Allergic rhinitis 09/16/2014   Anal inflammation 03/08/2018   Asthma 09/13/2013   Backache 01/17/2007   Qualifier: Diagnosis of   By: [name], [name]      IMO SNOMED Dx Update Oct 2024     Basal cell carcinoma (BCC) of left shoulder 03/14/2022   Basal cell carcinoma of face 03/14/2022   Basal cell carcinoma of right popliteal region 03/14/2022   CHEST PAIN 06/29/2009   Qualifier: Diagnosis of  By: [name], [facility] WALL PAIN, ANTERIOR 09/22/2010   Qualifier: Diagnosis of  By: [name], [name]     Chronic pain of right ankle 09/20/2018   Decreased libido 12/14/2022   occasional ED.     Dyspnea on exertion 03/16/2022   Dysthymia 11/21/2018   Ectopic atrial rhythm 03/16/2022   Elevated blood sugar 09/20/2018   Elevated liver enzymes 07/19/2020   Essential hypertension 01/17/2007   Qualifier: Diagnosis of  By: [name], [name]     External hemorrhoids 03/31/2008   Qualifier: Diagnosis of  By: [name], [name]    GASTROENTERITIS 06/29/2009   Qualifier: Diagnosis of  By: [name], [name] 11/21/2011   GERD 06/29/2009   Qualifier: Diagnosis of  By: [name], [facility]     Healthcare maintenance 09/10/2017   Hemangioma of skin and subcutaneous tissue 03/14/2022   Hepatitis    unknown type many yrs ago - no  residual problems   History of malignant neoplasm of skin 03/14/2022   Insomnia    Lapband APL + [facility] repair July 2013 04/24/2012   Left arm numbness 02/05/2014   Leg wound, left 09/16/2014   Lentigo 03/14/2022   Low testosterone  09/10/2017   Lump in neck 04/22/2014   Medically noncompliant 07/16/2020   Melanocytic nevi of left upper limb, including shoulder 03/14/2022   Melanocytic nevi of trunk 03/14/2022   Mild sleep apnea 03/04/2024   02/08/24 Home sleep study ([facility]): AHI 5   Milia 03/14/2022   Morbid obesity (HCC)    Neoplasm of uncertain behavior of skin 03/14/2022   Night sweat 11/21/2018   OBESITY 03/31/2008   Qualifier: Diagnosis of  By: [name], [name]    Obesity (BMI 30.0-34.9) 03/16/2022   Ocular rosacea    Other seborrheic keratosis 03/14/2022   Other skin changes due to chronic exposure to nonionizing radiation 03/14/2022   Palpitations 03/16/2022   Post-nasal drip 07/16/2020   Pruritus ani 11/21/2018   Pseudofolliculitis barbae 03/14/2022   Vasomotor rhinitis 11/21/2018   Viral warts 03/14/2022   Vitamin D  deficiency 09/23/2018   Past Surgical History:  Procedure Laterality Date   ANKLE ARTHROSCOPY WITH RECONSTRUCTION Right 09/26/2018   Procedure: ANKLE DEBRIDEMENT WITH POSSIBLE LATERAL LIGAMENT  RECONSTRUCTION;  Surgeon: [name], MD;  Location: [facility] OR;  Service: Orthopedics;  Laterality: Right;   ATRIAL FIBRILLATION ABLATION N/A 01/04/2024   Procedure: ATRIAL FIBRILLATION ABLATION;  Surgeon: [name], MD;  Location: [facility] INVASIVE CV LAB;  Service: Cardiovascular;  Laterality: N/A;   HIATAL HERNIA REPAIR  04/23/2012   Procedure: HERNIA REPAIR HIATAL;  Surgeon: [name], MD;  Location: [facility] ORS;  Service: General;  Laterality: N/A;   KNEE SURGERY Right    fracture knee cap   LAPAROSCOPIC APPENDECTOMY N/A 07/31/2018   Procedure: APPENDECTOMY LAPAROSCOPIC;  Surgeon: [name], MD;  Location: [facility] ORS;  Service: General;   Laterality: N/A;   LAPAROSCOPIC GASTRIC BANDING  04/23/2012   Procedure: LAPAROSCOPIC GASTRIC BANDING;  Surgeon: [name], MD;  Location: [facility] ORS;  Service: General;  Laterality: N/A;   LASIK     Patient Active Problem List   Diagnosis Date Noted   Mild sleep apnea 03/04/2024   PAF (paroxysmal atrial fibrillation) (HCC) 09/04/2023   Decreased libido 12/14/2022   Ectopic atrial rhythm 03/16/2022   Obesity (BMI 30.0-34.9) 03/16/2022   Dyspnea on exertion 03/16/2022   Palpitations 03/16/2022   Hepatitis 03/14/2022   Morbid obesity (HCC) 03/14/2022   Ocular rosacea 03/14/2022   Basal cell carcinoma (BCC) of left shoulder 03/14/2022   Basal cell carcinoma of face 03/14/2022   Basal cell carcinoma of right popliteal region 03/14/2022   Hemangioma of skin and subcutaneous tissue 03/14/2022   History of malignant neoplasm of skin 03/14/2022   Lentigo 03/14/2022   Viral warts 03/14/2022   Melanocytic nevi of left upper limb, including shoulder 03/14/2022   Melanocytic nevi of trunk 03/14/2022   Milia 03/14/2022   Neoplasm of uncertain behavior of skin 03/14/2022   Actinic keratosis 03/14/2022   Other seborrheic keratosis 03/14/2022   Other skin changes due to chronic exposure to nonionizing radiation 03/14/2022   Pseudofolliculitis barbae 03/14/2022   Elevated liver enzymes 07/19/2020   Medically noncompliant 07/16/2020   Post-nasal drip 07/16/2020   Pruritus ani 11/21/2018   Dysthymia 11/21/2018   Vasomotor rhinitis 11/21/2018   Night sweat 11/21/2018   Vitamin D  deficiency 09/23/2018   Chronic pain of right ankle 09/20/2018   Elevated blood sugar 09/20/2018   Alcohol use 09/20/2018   Acute appendicitis 07/31/2018   Anal inflammation 03/08/2018   Healthcare maintenance 09/10/2017   Low testosterone  09/10/2017   Leg wound, left 09/16/2014   Allergic rhinitis 09/16/2014   Lump in neck 04/22/2014   Left arm numbness 02/05/2014   Asthma 09/13/2013   Lapband APL + [facility]  repair July 2013 04/24/2012   General medical examination 11/21/2011   Insomnia 05/03/2011   CHEST WALL PAIN, ANTERIOR 09/22/2010   GERD 06/29/2009   GASTROENTERITIS 06/29/2009   CHEST PAIN 06/29/2009   ACHILLES TENDINITIS 12/28/2008   Acute frontal sinusitis 09/01/2008   OBESITY 03/31/2008   External hemorrhoids 03/31/2008   Essential hypertension 01/17/2007   Backache 01/17/2007    PCP: Almarie Waddell KATHEE, NP  REFERRING PROVIDER: Almarie Waddell KATHEE, NP  REFERRING DIAG: [phone] (ICD-10-CM) - Chronic bilateral low back pain with left-sided sciatica  Rationale for Evaluation and Treatment: Rehabilitation  THERAPY DIAG:  No diagnosis found.  ONSET DATE: At least 1 year  SUBJECTIVE:  SUBJECTIVE STATEMENT: Pt reports still sore, last session went fine  From eval: .SABRASABRARecently I've lost some weight. I had surgery a couple years ago and plateaued. Last Summer I was walking and was in pretty good shape and lost more weight. Pt reports he had a recent ablation and is now cleared to return to more activity. Pt states his back pain comes and goes. Pt notes he helped his son moved and could barely walk. Sometimes it would go down his leg. Pt states he sat for a long period on a bench and when he stood up his back just felt tight. Unable to put his finger on specific examples that worsen his back. Gets massages frequently from his girlfriend which does help. Was working out with a trainer 3x/wk prior to his heart ablation (last seen on Thanksgiving) and was working on Emergency planning/management officer.   PERTINENT HISTORY:  s/p afib and flutter ablation HTN, atrial flutter, atrial fibrillation, weekly testosterone  injections  PAIN:  Are you having pain? Yes: NPRS scale: 4 or 5 out of 10; can get at worst 7 or 8 Pain  location: Middle low back (maybe left worse) -- can radiate down occasionally R or L leg Pain description: tightness Aggravating factors: Not sure -- sometimes standing and walking Relieving factors: move around and stretch a little  PRECAUTIONS: None  RED FLAGS: None   WEIGHT BEARING RESTRICTIONS: No  FALLS:  Has patient fallen in last 6 months? 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  DIAGNOSTIC FINDINGS:  01/23/24 lumbar x-ray IMPRESSION: Mild degenerative joint changes at L5-S1.  PATIENT SURVEYS:  Modified Oswestry Low Back Pain Disability Questionnaire: 12 / 50 = 24.0 %  COGNITION: Overall cognitive status: Within functional limits for tasks assessed     SENSATION: WFL  MUSCLE LENGTH: Hamstrings: Right ~60 deg; Left ~60 deg Thomas test: Right ~10 deg; Left ~10 deg  POSTURE: Mild scoliosis with R iliac crest slightly higher than L  PALPATION: Mobile throughout thoracic and lumbar spine; tender to palpation and tight around lumbar paraspinals and QL  LUMBAR ROM:   AROM eval  Flexion 75%  Extension 100% dull ache  Right lateral flexion 1.5 above knee  Left lateral flexion 1.5 above knee  Right rotation 100%  Left rotation 100%   (Blank rows = not tested)  LOWER EXTREMITY ROM:     Active  Right eval Left eval  Hip flexion    Hip extension    Hip abduction    Hip adduction    Hip internal rotation    Hip external rotation    Knee flexion    Knee extension    Ankle dorsiflexion    Ankle plantarflexion    Ankle inversion    Ankle eversion     (Blank rows = not tested)  LOWER EXTREMITY MMT:    MMT Right eval Left eval  Hip flexion 4- 4-  Hip extension 4-  4-  Hip abduction 3+ 3+  Hip adduction    Hip internal rotation 5 5  Hip external rotation 4+ 4+  Knee flexion 4 4  Knee extension 5 5  Ankle dorsiflexion    Ankle plantarflexion    Ankle inversion    Ankle  eversion     (Blank rows = not tested)  LUMBAR SPECIAL TESTS:  Straight leg raise test: Positive, FABER test: Negative, and Trendelenburg sign: Positive  FUNCTIONAL TESTS:  Double leg lowering test: ~50 deg Plank (forearm and knees) 2 min SLS: >1 min R & L, R more unstable than L  GAIT: Distance walked: Into clinic Assistive device utilized: None Level of assistance: Complete Independence Comments: Normal reciprocal pattern  TREATMENT DATE:  03/13/24 Nustep level 5 x 6 minute Seated lumbar flexion green ball 2x30 Supine HS stretch w/ strap x 30 Supine piriformis stretch KTOS and figure 4 x 30 B Leg press 25lb 2x10  Pallof press blue TB 2x10 B Trunk rotation blue TB 2x10 B Foam roll to low back and glutes 03/07/24 Nustep level 5 x 6 minute Rows 25# 2x10 Lats 25# 2x10 LEg curls 25# 2x10 Green tband hip extension and abduction 2x10 Supine Feet on ball K2C, rotation, bridge, isometric abs Green tband clamshells Passive stretch LE's Updated HEP see below  02/25/24 Seated hamstring stretch x 30 Supine hip flexor stretch 2x 30 Figure 4 stretch x 30 LTR 2x30 PPT 15x5 Sidelying clamshell red TB 2x10 Prone 1/2 on mat table hip extension 2x10 Supine hip extension/bridge off EOB 2x10   02/11/24 See HEP below                                                                                                                              Attempted bridging but pt feeling it in his back and not in glutes/hamstrings   PATIENT EDUCATION:  Education details: Exam findings, POC, initial HEP Person educated: Patient Education method: Explanation, Demonstration, and Handouts Education comprehension: verbalized understanding, returned demonstration, and needs further  education  HOME EXERCISE PROGRAM: Access Code: [email or web address] URL: [email or web address] Date: 02/25/2024 Prepared by: Gellen April Marie Nonato  Exercises - Seated Hamstring Stretch  - 1 x daily - 7 x weekly - 2 sets - 30 sec hold - Supine Lower Trunk Rotation  - 1 x daily - 7 x weekly - 2 sets - 30 sec hold - Thomas Stretch on Table  - 1 x daily - 7 x weekly - 2 sets - 30 sec hold - Supine Posterior Pelvic Tilt  - 1 x daily - 7 x weekly - 1 sets - 15 reps - 5 sec hold - Clamshell with Resistance  - 1 x daily - 7 x weekly - 3 sets - 10 reps - Prone Hip Extension on Table  - 1 x daily - 7 x weekly - 2-3 sets - 10 reps - Supine Hip Extension on Bench  - 1 x daily - 7 x weekly - 2-3 sets - 10 reps Access Code: [email or web address] URL: [email or web address] Date: 03/13/2024 Prepared by: Braylin Clark  Exercises - Standing Hip Extension with Anchored Resistance  - 1 x daily - 7 x weekly - 3 sets - 10 reps - 3 hold - Standing Hip Abduction with  Anchored Resistance  - 1 x daily - 7 x weekly - 3 sets - 10 reps - 3 hold - Supine Hamstring Stretch with Doorway  - 1 x daily - 7 x weekly - 3 sets - 10 reps - 30 hold - Seated Hamstring Stretch with Chair  - 1 x daily - 7 x weekly - 3 sets - 10 reps - 30 hold - Supine Piriformis Stretch Pulling Heel to Hip  - 1 x daily - 7 x weekly - 3 sets - 10 reps - 20 hold - Gastroc Stretch on Wall  - 1 x daily - 7 x weekly - 3 sets - 10 reps - 30 hold - Supine Double Knee to Chest  - 1 x daily - 7 x weekly - 3 sets - 10 reps - 10 hold - Supine Lower Trunk Rotation  - 1 x daily - 7 x weekly - 3 sets - 10 reps - 10 hold - Supine Piriformis Stretch with Foot on Ground  - 2 x daily - 7 x weekly - 2 sets - 2 reps - 30 sec hold - Supine Figure 4 Piriformis Stretch  - 2 x daily - 7 x weekly - 2 sets - 2 reps - 30 sec hold - Supine Hamstring Stretch with Strap  - 2 x daily - 7 x weekly - 2 sets - 2 reps - 30 sec hold ASSESSMENT:  CLINICAL  IMPRESSION: Pt has continued tightness along the posterior chain, so we continued focusing on flexibility. We tried some hip hinges but he felt it too much in his low back. He did like the supine stretches so we will add these to HEP. Cues provided to correct form as needed.   From eval: Patient is a 53 y.o. M who was seen today for physical therapy evaluation and treatment for intermittent low back. Assessment is significant for hamstring and hip flexor tightness with weak posterior LE chain (mostly glutes) with limited lumbar ROM and increased muscle tightness in lumbar paraspinals. Trialed hip strengthening exercises this session; able to feel good muscle contraction for hip abd but pt reports not feeling enough glute or hamstring activation with bridge or prone hip extension -- may need modifications next session. Pt will benefit from PT to address these issues to maximize his function to meet goals such as running.   OBJECTIVE IMPAIRMENTS: decreased activity tolerance, decreased coordination, decreased endurance, decreased mobility, decreased ROM, decreased strength, increased fascial restrictions, increased muscle spasms, impaired flexibility, improper body mechanics, and pain.     GOALS: Goals reviewed with patient? Yes  SHORT TERM GOALS: Target date: 03/03/2024   Pt will be ind with initial HEP Baseline: Goal status: MET- 03/13/24  2.  Pt will demo improved hamstring length with a SLR of at least 70 deg Baseline:  Goal status: progressing 03/07/24  3.  Pt will demo improved hip flexor length with ability to obtain neutral hip ext during Debby Testing Baseline:  Goal status: INITIAL   LONG TERM GOALS: Target date: 03/24/2024   Pt will be ind with management and progression of HEP Baseline:  Goal status: INITIAL  2.  Pt will be able to deadlift at least 25# with good body mechanics for increased glute and hamstring strength Baseline:  Goal status: IN PROGRESS- LBP with hip  hinge motion  3.  Pt will report improved overall pain by >/=50% (intensity, frequency and duration) Baseline:  Goal status: INITIAL  4.  Pt will have improved Modified Oswestry score  to </=12% to demo MCID Baseline: Modified Oswestry Low Back Pain Disability Questionnaire: 12 / 50 = 24.0 % Goal status: INITIAL  5.  Pt will demo full and pain free lumbar ROM for improved tightness Baseline:  Goal status: INITIAL  6. Pt will be able to jog for at least 10 min without exacerbation of pain for pt's personal goals to start running Baseline:  Goal status: INITIAL   PLAN:  PT FREQUENCY: 1-2x/week  PT DURATION: 6 weeks  PLANNED INTERVENTIONS: [id number]- PT Re-evaluation, 97750- Physical Performance Testing, 97110-Therapeutic exercises, 97530- Therapeutic activity, 97112- Neuromuscular re-education, 97535- Self Care, [id number]- Manual therapy, [id number]- Gait training, [phone]- Aquatic Therapy, [phone]- Electrical stimulation (unattended), [phone]- Ionotophoresis 4mg /ml Dexamethasone , Patient/Family education, Balance training, Stair training, Taping, Dry Needling, Joint mobilization, Spinal mobilization, Cryotherapy, and Moist heat.  PLAN FOR NEXT SESSION: Assess and modify HEP accordingly. 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 Improve lumbar, hamstring and hip flexibility. Progress core strengthening.    Lis Savitt, PT 03/26/2024, 9:23 AM

## 2024-03-30 ENCOUNTER — Other Ambulatory Visit (HOSPITAL_BASED_OUTPATIENT_CLINIC_OR_DEPARTMENT_OTHER): Payer: Self-pay

## 2024-03-31 ENCOUNTER — Other Ambulatory Visit (HOSPITAL_BASED_OUTPATIENT_CLINIC_OR_DEPARTMENT_OTHER): Payer: Self-pay

## 2024-03-31 MED ORDER — DOXYCYCLINE HYCLATE 50 MG PO CAPS
50.0000 mg | ORAL_CAPSULE | Freq: Every day | ORAL | 1 refills | Status: DC
  Filled 2024-04-08 – 2024-05-05 (×3): qty 30, 30d supply, fill #0
  Filled 2024-06-01: qty 30, 30d supply, fill #1

## 2024-04-02 ENCOUNTER — Ambulatory Visit: Admitting: Physical Therapy

## 2024-04-02 ENCOUNTER — Encounter: Payer: Self-pay | Admitting: Physical Therapy

## 2024-04-02 DIAGNOSIS — R2689 Other abnormalities of gait and mobility: Secondary | ICD-10-CM

## 2024-04-02 DIAGNOSIS — M5459 Other low back pain: Secondary | ICD-10-CM | POA: Diagnosis not present

## 2024-04-02 DIAGNOSIS — M6281 Muscle weakness (generalized): Secondary | ICD-10-CM | POA: Diagnosis present

## 2024-04-02 NOTE — Therapy (Signed)
 OUTPATIENT PHYSICAL THERAPY TREATMENT/RECERTIFICATION    Patient Name: Gilbert [name] MRN: [id number] DOB:11-10-1970, 53 y.o., male Today's Date: 04/02/2024  END OF SESSION:  PT End of Session - 04/02/24 0800     Visit Number 5    Number of Visits 7    Date for PT Re-Evaluation 03/24/24    Authorization Type BCBS    PT Start Time 0800    PT Stop Time 0840    PT Time Calculation (min) 40 min    Activity Tolerance Patient tolerated treatment well    Behavior During Therapy [facility] for tasks assessed/performed             Past Medical History:  Diagnosis Date   ACHILLES TENDINITIS 12/28/2008   Qualifier: Diagnosis of  By: [name], [name]     Actinic keratosis 03/14/2022   Acute appendicitis 07/31/2018   Acute frontal sinusitis 09/01/2008   Qualifier: Diagnosis of  By: Antonio DOJamee     Alcohol use 09/20/2018   Allergic rhinitis 09/16/2014   Anal inflammation 03/08/2018   Asthma 09/13/2013   Backache 01/17/2007   Qualifier: Diagnosis of   By: [name], [name]      IMO SNOMED Dx Update Oct 2024     Basal cell carcinoma (BCC) of left shoulder 03/14/2022   Basal cell carcinoma of face 03/14/2022   Basal cell carcinoma of right popliteal region 03/14/2022   CHEST PAIN 06/29/2009   Qualifier: Diagnosis of  By: [name], [facility] WALL PAIN, ANTERIOR 09/22/2010   Qualifier: Diagnosis of  By: [name], [name]     Chronic pain of right ankle 09/20/2018   Decreased libido 12/14/2022   occasional ED.     Dyspnea on exertion 03/16/2022   Dysthymia 11/21/2018   Ectopic atrial rhythm 03/16/2022   Elevated blood sugar 09/20/2018   Elevated liver enzymes 07/19/2020   Essential hypertension 01/17/2007   Qualifier: Diagnosis of  By: [name], [name]     External hemorrhoids 03/31/2008   Qualifier: Diagnosis of  By: [name], [name]    GASTROENTERITIS 06/29/2009   Qualifier: Diagnosis of  By: [name], [name]  11/21/2011   GERD 06/29/2009   Qualifier: Diagnosis of  By: [name], [facility]     Healthcare maintenance 09/10/2017   Hemangioma of skin and subcutaneous tissue 03/14/2022   Hepatitis    unknown type many yrs ago - no residual problems   History of malignant neoplasm of skin 03/14/2022   Insomnia    Lapband APL + [facility] repair July 2013 04/24/2012   Left arm numbness 02/05/2014   Leg wound, left 09/16/2014   Lentigo 03/14/2022   Low testosterone  09/10/2017   Lump in neck 04/22/2014   Medically noncompliant 07/16/2020   Melanocytic nevi of left upper limb, including shoulder 03/14/2022   Melanocytic nevi of trunk 03/14/2022   Mild sleep apnea 03/04/2024   02/08/24 Home sleep study ([facility]): AHI 5   Milia 03/14/2022   Morbid obesity (HCC)    Neoplasm of uncertain behavior of skin 03/14/2022   Night sweat 11/21/2018   OBESITY 03/31/2008   Qualifier: Diagnosis of  By: [name], [name]    Obesity (BMI 30.0-34.9) 03/16/2022   Ocular rosacea    Other seborrheic keratosis 03/14/2022   Other skin changes due to chronic exposure to nonionizing radiation 03/14/2022   Palpitations 03/16/2022   Post-nasal drip 07/16/2020   Pruritus  ani 11/21/2018   Pseudofolliculitis barbae 03/14/2022   Vasomotor rhinitis 11/21/2018   Viral warts 03/14/2022   Vitamin D  deficiency 09/23/2018   Past Surgical History:  Procedure Laterality Date   ANKLE ARTHROSCOPY WITH RECONSTRUCTION Right 09/26/2018   Procedure: ANKLE DEBRIDEMENT WITH POSSIBLE LATERAL LIGAMENT  RECONSTRUCTION;  Surgeon: [name], MD;  Location: [facility] OR;  Service: Orthopedics;  Laterality: Right;   ATRIAL FIBRILLATION ABLATION N/A 01/04/2024   Procedure: ATRIAL FIBRILLATION ABLATION;  Surgeon: [name], MD;  Location: [facility] INVASIVE CV LAB;  Service: Cardiovascular;  Laterality: N/A;   HIATAL HERNIA REPAIR  04/23/2012   Procedure: HERNIA REPAIR HIATAL;  Surgeon: [name], MD;  Location: [facility] ORS;  Service:  General;  Laterality: N/A;   KNEE SURGERY Right    fracture knee cap   LAPAROSCOPIC APPENDECTOMY N/A 07/31/2018   Procedure: APPENDECTOMY LAPAROSCOPIC;  Surgeon: [name], MD;  Location: [facility] ORS;  Service: General;  Laterality: N/A;   LAPAROSCOPIC GASTRIC BANDING  04/23/2012   Procedure: LAPAROSCOPIC GASTRIC BANDING;  Surgeon: [name], MD;  Location: [facility] ORS;  Service: General;  Laterality: N/A;   LASIK     Patient Active Problem List   Diagnosis Date Noted   Mild sleep apnea 03/04/2024   PAF (paroxysmal atrial fibrillation) (HCC) 09/04/2023   Decreased libido 12/14/2022   Ectopic atrial rhythm 03/16/2022   Obesity (BMI 30.0-34.9) 03/16/2022   Dyspnea on exertion 03/16/2022   Palpitations 03/16/2022   Hepatitis 03/14/2022   Morbid obesity (HCC) 03/14/2022   Ocular rosacea 03/14/2022   Basal cell carcinoma (BCC) of left shoulder 03/14/2022   Basal cell carcinoma of face 03/14/2022   Basal cell carcinoma of right popliteal region 03/14/2022   Hemangioma of skin and subcutaneous tissue 03/14/2022   History of malignant neoplasm of skin 03/14/2022   Lentigo 03/14/2022   Viral warts 03/14/2022   Melanocytic nevi of left upper limb, including shoulder 03/14/2022   Melanocytic nevi of trunk 03/14/2022   Milia 03/14/2022   Neoplasm of uncertain behavior of skin 03/14/2022   Actinic keratosis 03/14/2022   Other seborrheic keratosis 03/14/2022   Other skin changes due to chronic exposure to nonionizing radiation 03/14/2022   Pseudofolliculitis barbae 03/14/2022   Elevated liver enzymes 07/19/2020   Medically noncompliant 07/16/2020   Post-nasal drip 07/16/2020   Pruritus ani 11/21/2018   Dysthymia 11/21/2018   Vasomotor rhinitis 11/21/2018   Night sweat 11/21/2018   Vitamin D  deficiency 09/23/2018   Chronic pain of right ankle 09/20/2018   Elevated blood sugar 09/20/2018   Alcohol use 09/20/2018   Acute appendicitis 07/31/2018   Anal inflammation 03/08/2018    Healthcare maintenance 09/10/2017   Low testosterone  09/10/2017   Leg wound, left 09/16/2014   Allergic rhinitis 09/16/2014   Lump in neck 04/22/2014   Left arm numbness 02/05/2014   Asthma 09/13/2013   Lapband APL + HH repair July 2013 04/24/2012   General medical examination 11/21/2011   Insomnia 05/03/2011   CHEST WALL PAIN, ANTERIOR 09/22/2010   GERD 06/29/2009   GASTROENTERITIS 06/29/2009   CHEST PAIN 06/29/2009   ACHILLES TENDINITIS 12/28/2008   Acute frontal sinusitis 09/01/2008   OBESITY 03/31/2008   External hemorrhoids 03/31/2008   Essential hypertension 01/17/2007   Backache 01/17/2007    PCP: Almarie Waddell KATHEE, NP  REFERRING PROVIDER: Almarie Waddell KATHEE, NP  REFERRING DIAG: [phone] (ICD-10-CM) - Chronic bilateral low back pain with left-sided sciatica  Rationale for Evaluation and Treatment: Rehabilitation  THERAPY DIAG:  Other  low back pain  Other abnormalities of gait and mobility  Muscle weakness (generalized)  ONSET DATE: At least 1 year  SUBJECTIVE:                                                                                                                                                                                           SUBJECTIVE STATEMENT: Pt states he was able to run a 5k the whole time. States he had the best run without too much pain. Has been stretching some. I do feel I'm making progress but I'm not where I want to be.  From eval: .SABRASABRARecently I've lost some weight. I had surgery a couple years ago and plateaued. Last Summer I was walking and was in pretty good shape and lost more weight. Pt reports he had a recent ablation and is now cleared to return to more activity. Pt states his back pain comes and goes. Pt notes he helped his son moved and could barely walk. Sometimes it would go down his leg. Pt states he sat for a long period on a bench and when he stood up his back just felt tight. Unable to put his finger on specific examples  that worsen his back. Gets massages frequently from his girlfriend which does help. Was working out with a trainer 3x/wk prior to his heart ablation (last seen on Thanksgiving) and was working on Emergency planning/management officer.   PERTINENT HISTORY:  s/p afib and flutter ablation HTN, atrial flutter, atrial fibrillation, weekly testosterone  injections  PAIN:  Are you having pain? Yes: NPRS scale: 3/10 currently; at worst 7 or 8  Pain location: Middle low back (maybe left worse) -- can radiate down occasionally R or L leg Pain description: tightness Aggravating factors: Not sure -- sometimes standing and walking Relieving factors: move around and stretch a little  PRECAUTIONS: None  RED FLAGS: None   WEIGHT BEARING RESTRICTIONS: No  FALLS:  Has patient fallen in last 6 months? 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  DIAGNOSTIC FINDINGS:  01/23/24 lumbar x-ray IMPRESSION: Mild degenerative joint changes at L5-S1.  PATIENT SURVEYS:  Modified Oswestry Low Back Pain Disability Questionnaire: 12 / 50 = 24.0 % (eval) Modified Oswestry Low Back Pain Disability Questionnaire: 7 / 50 = 14.0 % 04/02/24  COGNITION: Overall cognitive status: Within functional limits for tasks assessed  SENSATION: WFL  MUSCLE LENGTH: Hamstrings: Right ~60 deg; Left ~60 deg Thomas test: Right ~10 deg; Left ~10 deg  POSTURE: Mild scoliosis with R iliac crest slightly higher than L  PALPATION: Mobile throughout thoracic and lumbar spine; tender to palpation and tight around lumbar paraspinals and QL  LUMBAR ROM:   AROM eval 04/02/24  Flexion 75% 90% painful  Extension 100% dull ache 100%  Right lateral flexion 1.5  above knee At knee  Left lateral flexion 1.5 above knee At knee  Right rotation 100% 100%  Left rotation 100% 100%   (Blank rows = not tested)  LOWER EXTREMITY ROM:     Active  Right eval Left eval  Hip flexion    Hip extension    Hip abduction    Hip adduction    Hip internal rotation    Hip external rotation    Knee flexion    Knee extension    Ankle dorsiflexion    Ankle plantarflexion    Ankle inversion    Ankle eversion     (Blank rows = not tested)  LOWER EXTREMITY MMT:    MMT Right eval Left eval  Hip flexion 4- 4-  Hip extension 4- 4-  Hip abduction 3+ 3+  Hip adduction    Hip internal rotation 5 5  Hip external rotation 4+ 4+  Knee flexion 4 4  Knee extension 5 5  Ankle dorsiflexion    Ankle plantarflexion    Ankle inversion    Ankle eversion     (Blank rows = not tested)  LUMBAR SPECIAL TESTS:  Straight leg raise test: Positive, FABER test: Negative, and Trendelenburg sign: Positive  FUNCTIONAL TESTS:  Double leg lowering test: ~50 deg Plank (forearm and knees) 2 min SLS: >1 min R & L, R more unstable than L  GAIT: Distance walked: Into clinic Assistive device utilized: None Level of assistance: Complete Independence Comments: Normal reciprocal pattern  TREATMENT DATE:  04/02/24 Recumbent bike L3 x 6 min Supine hip flexor stretch 2x 30 Supine piriformis stretch 2x30 Passive hamstring stretch contract/relax R&L Passive double knee to chest 2x30 Supine Feet on ball knees bent bridge, knees straight bridge, obliques twist knees straight Supine knees to chest and ext 3x10 Standing lumbar ext  03/13/24 Nustep level 5 x 6 minute Seated lumbar flexion green ball 2x30 Supine HS stretch w/ strap x 30 Supine piriformis stretch KTOS and figure 4 x 30 B Leg press 25lb 2x10  Pallof press blue TB 2x10 B Trunk rotation blue TB 2x10 B Foam roll to low back and glutes  03/07/24 Nustep level 5 x 6 minute Rows 25# 2x10 Lats 25# 2x10 LEg  curls 25# 2x10 Green tband hip extension and abduction 2x10 Supine Feet on ball K2C, rotation, bridge, isometric abs Green tband clamshells Passive stretch LE's Updated HEP see below  02/25/24 Seated hamstring stretch x 30 Supine hip flexor stretch 2x 30 Figure 4 stretch x 30 LTR 2x30 PPT 15x5 Sidelying clamshell red TB 2x10 Prone 1/2 on mat table hip extension 2x10 Supine hip extension/bridge off EOB 2x10   02/11/24 See HEP below  Attempted bridging but pt feeling it in his back and not in glutes/hamstrings   PATIENT EDUCATION:  Education details: Exam findings, POC, initial HEP Person educated: Patient Education method: Explanation, Demonstration, and Handouts Education comprehension: verbalized understanding, returned demonstration, and needs further education  HOME EXERCISE PROGRAM: Access Code: [email or web address] URL: [email or web address] Date: 02/25/2024 Prepared by: Derick Seminara April Marie Evadean Sproule  Exercises - Seated Hamstring Stretch  - 1 x daily - 7 x weekly - 2 sets - 30 sec hold - Supine Lower Trunk Rotation  - 1 x daily - 7 x weekly - 2 sets - 30 sec hold - Thomas Stretch on Table  - 1 x daily - 7 x weekly - 2 sets - 30 sec hold - Supine Posterior Pelvic Tilt  - 1 x daily - 7 x weekly - 1 sets - 15 reps - 5 sec hold - Clamshell with Resistance  - 1 x daily - 7 x weekly - 3 sets - 10 reps - Prone Hip Extension on Table  - 1 x daily - 7 x weekly - 2-3 sets - 10 reps - Supine Hip Extension on Bench  - 1 x daily - 7 x weekly - 2-3 sets - 10 reps Access Code: [email or web address] URL: [email or web address] Date: 03/13/2024 Prepared by: Braylin Clark  Exercises - Standing Hip Extension with Anchored Resistance  - 1 x daily - 7 x weekly - 3 sets - 10 reps - 3 hold - Standing Hip Abduction with Anchored Resistance  - 1 x daily - 7 x  weekly - 3 sets - 10 reps - 3 hold - Supine Hamstring Stretch with Doorway  - 1 x daily - 7 x weekly - 3 sets - 10 reps - 30 hold - Seated Hamstring Stretch with Chair  - 1 x daily - 7 x weekly - 3 sets - 10 reps - 30 hold - Supine Piriformis Stretch Pulling Heel to Hip  - 1 x daily - 7 x weekly - 3 sets - 10 reps - 20 hold - Gastroc Stretch on Wall  - 1 x daily - 7 x weekly - 3 sets - 10 reps - 30 hold - Supine Double Knee to Chest  - 1 x daily - 7 x weekly - 3 sets - 10 reps - 10 hold - Supine Lower Trunk Rotation  - 1 x daily - 7 x weekly - 3 sets - 10 reps - 10 hold - Supine Piriformis Stretch with Foot on Ground  - 2 x daily - 7 x weekly - 2 sets - 2 reps - 30 sec hold - Supine Figure 4 Piriformis Stretch  - 2 x daily - 7 x weekly - 2 sets - 2 reps - 30 sec hold - Supine Hamstring Stretch with Strap  - 2 x daily - 7 x weekly - 2 sets - 2 reps - 30 sec hold ASSESSMENT:  CLINICAL IMPRESSION: Treatment session continues to focus on improving posterior chain flexibility. Continued core and hip strengthening. Rechecked goals -- pt is demonstrating improving lumbar motion. Remains most limited and painful with lumbar flexion; however, did demo a preference for lumbar extension today without exacerbation of his pain. Hamstrings and glutes remain tight but are improving (they still pull into his low back and cause pain during SLR). Strength remains an issue. Discussed returning to personal training for pt to get more consistent exercise and consideration of stretch zone to get assist with his stretching. Consistency with his HEP and stretching could improve.  Goals remain ongoing and pt will benefit from continued PT sessions to fully realize his goals.   From eval: Patient is a 53 y.o. M who was seen today for physical therapy evaluation and treatment for intermittent low back. Assessment is significant for hamstring and hip flexor tightness with weak posterior LE chain (mostly glutes) with limited  lumbar ROM and increased muscle tightness in lumbar paraspinals. Trialed hip strengthening exercises this session; able to feel good muscle contraction for hip abd but pt reports not feeling enough glute or hamstring activation with bridge or prone hip extension -- may need modifications next session. Pt will benefit from PT to address these issues to maximize his function to meet goals such as running.   OBJECTIVE IMPAIRMENTS: decreased activity tolerance, decreased coordination, decreased endurance, decreased mobility, decreased ROM, decreased strength, increased fascial restrictions, increased muscle spasms, impaired flexibility, improper body mechanics, and pain.     GOALS: Goals reviewed with patient? Yes  SHORT TERM GOALS: Target date: 03/03/2024   Pt will be ind with initial HEP Baseline: Goal status: MET- 03/13/24  2.  Pt will demo improved hamstring length with a SLR of at least 70 deg Baseline: ~60 deg 04/02/24: ~80 deg Goal status: MET  3.  Pt will demo improved hip flexor length with ability to obtain neutral hip ext during Debby Testing Baseline: 10 deg from neutral 04/02/24: able to touch mat table with Debby testing Goal status: MET   LONG TERM GOALS: NEW Target date: 05/14/2024    Pt will be ind with management and progression of HEP Baseline:  Goal status: IN PROGRESS 04/02/24 - pt is not consistent  2.  Pt will be able to deadlift at least 25# with good body mechanics for increased glute and hamstring strength Baseline:  Goal status: IN PROGRESS- LBP with hip hinge motion  3.  Pt will report improved overall pain by >/=50% (intensity, frequency and duration) Baseline:  04/02/24: 10% improvement Goal status: IN PROGRESS  4.  Pt will have improved Modified Oswestry score to </=12% to demo MCID Baseline: Modified Oswestry Low Back Pain Disability Questionnaire: 12 / 50 = 24.0 % 04/02/24: Modified Oswestry Low Back Pain Disability Questionnaire: 7 / 50 = 14.0 % Goal  status: IN PROGRESS  5.  Pt will demo full and pain free lumbar ROM for improved tightness Baseline:  04/02/24: see ROM chart above Goal status: IN PROGRESS  6. Pt will be able to jog for at least 10 min without exacerbation of pain for pt's personal goals to start running Baseline:  Goal status: PARTIALLY MET 04/02/24 - able to do 5k but with some pain and soreness   PLAN:  PT FREQUENCY: 1-2x/week  PT DURATION: 6 weeks  PLANNED INTERVENTIONS: [id number]- PT Re-evaluation, 97750- Physical Performance Testing, 97110-Therapeutic exercises, 97530- Therapeutic activity, 97112- Neuromuscular re-education, 97535- Self Care, [id number]- Manual therapy, [id number]- Gait training, [phone]- Aquatic Therapy, [phone]- Electrical stimulation (unattended), [phone]- Ionotophoresis 4mg /ml Dexamethasone , Patient/Family education, Balance training, Stair training, Taping, Dry Needling, Joint mobilization, Spinal mobilization, Cryotherapy, and Moist heat.  PLAN FOR NEXT SESSION: Assess and modify HEP accordingly. 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 Improve lumbar, hamstring and hip flexibility. Progress core strengthening.    Nikkole Placzek April Ma L Nickalas Mccarrick, PT 04/02/2024, 8:00 AM

## 2024-04-04 DIAGNOSIS — G4733 Obstructive sleep apnea (adult) (pediatric): Secondary | ICD-10-CM | POA: Diagnosis not present

## 2024-04-07 ENCOUNTER — Ambulatory Visit: Admitting: Physical Therapy

## 2024-04-07 ENCOUNTER — Encounter: Payer: Self-pay | Admitting: Physical Therapy

## 2024-04-07 DIAGNOSIS — R2689 Other abnormalities of gait and mobility: Secondary | ICD-10-CM | POA: Diagnosis not present

## 2024-04-07 DIAGNOSIS — M5459 Other low back pain: Secondary | ICD-10-CM | POA: Diagnosis not present

## 2024-04-07 DIAGNOSIS — M6281 Muscle weakness (generalized): Secondary | ICD-10-CM

## 2024-04-07 NOTE — Therapy (Signed)
 OUTPATIENT PHYSICAL THERAPY TREATMENT/RECERTIFICATION    Patient Name: Gilbert [name] MRN: [id number] DOB:July 31, 1971, 53 y.o., male Today's Date: 04/07/2024  END OF SESSION:  PT End of Session - 04/07/24 0811     Visit Number 6    Date for PT Re-Evaluation 05/14/24    Authorization Type BCBS    PT Start Time 0808    PT Stop Time 0855    PT Time Calculation (min) 47 min    Activity Tolerance Patient tolerated treatment well    Behavior During Therapy [facility] for tasks assessed/performed             Past Medical History:  Diagnosis Date   ACHILLES TENDINITIS 12/28/2008   Qualifier: Diagnosis of  By: [name], [name]     Actinic keratosis 03/14/2022   Acute appendicitis 07/31/2018   Acute frontal sinusitis 09/01/2008   Qualifier: Diagnosis of  By: [name]     Alcohol use 09/20/2018   Allergic rhinitis 09/16/2014   Anal inflammation 03/08/2018   Asthma 09/13/2013   Backache 01/17/2007   Qualifier: Diagnosis of   By: [name], [name]      IMO SNOMED Dx Update Oct 2024     Basal cell carcinoma (BCC) of left shoulder 03/14/2022   Basal cell carcinoma of face 03/14/2022   Basal cell carcinoma of right popliteal region 03/14/2022   CHEST PAIN 06/29/2009   Qualifier: Diagnosis of  By: [name], [facility] WALL PAIN, ANTERIOR 09/22/2010   Qualifier: Diagnosis of  By: [name], [name]     Chronic pain of right ankle 09/20/2018   Decreased libido 12/14/2022   occasional ED.     Dyspnea on exertion 03/16/2022   Dysthymia 11/21/2018   Ectopic atrial rhythm 03/16/2022   Elevated blood sugar 09/20/2018   Elevated liver enzymes 07/19/2020   Essential hypertension 01/17/2007   Qualifier: Diagnosis of  By: [name], [name]     External hemorrhoids 03/31/2008   Qualifier: Diagnosis of  By: [name], [name]    GASTROENTERITIS 06/29/2009   Qualifier: Diagnosis of  By: [name], [name] 11/21/2011   GERD  06/29/2009   Qualifier: Diagnosis of  By: [name], [facility]     Healthcare maintenance 09/10/2017   Hemangioma of skin and subcutaneous tissue 03/14/2022   Hepatitis    unknown type many yrs ago - no residual problems   History of malignant neoplasm of skin 03/14/2022   Insomnia    Lapband APL + [facility] repair July 2013 04/24/2012   Left arm numbness 02/05/2014   Leg wound, left 09/16/2014   Lentigo 03/14/2022   Low testosterone  09/10/2017   Lump in neck 04/22/2014   Medically noncompliant 07/16/2020   Melanocytic nevi of left upper limb, including shoulder 03/14/2022   Melanocytic nevi of trunk 03/14/2022   Mild sleep apnea 03/04/2024   02/08/24 Home sleep study ([facility]): AHI 5   Milia 03/14/2022   Morbid obesity (HCC)    Neoplasm of uncertain behavior of skin 03/14/2022   Night sweat 11/21/2018   OBESITY 03/31/2008   Qualifier: Diagnosis of  By: [name], [name]    Obesity (BMI 30.0-34.9) 03/16/2022   Ocular rosacea    Other seborrheic keratosis 03/14/2022   Other skin changes due to chronic exposure to nonionizing radiation 03/14/2022   Palpitations 03/16/2022   Post-nasal drip 07/16/2020   Pruritus ani 11/21/2018   Pseudofolliculitis barbae 03/14/2022  Vasomotor rhinitis 11/21/2018   Viral warts 03/14/2022   Vitamin D  deficiency 09/23/2018   Past Surgical History:  Procedure Laterality Date   ANKLE ARTHROSCOPY WITH RECONSTRUCTION Right 09/26/2018   Procedure: ANKLE DEBRIDEMENT WITH POSSIBLE LATERAL LIGAMENT  RECONSTRUCTION;  Surgeon: [name], MD;  Location: [facility] OR;  Service: Orthopedics;  Laterality: Right;   ATRIAL FIBRILLATION ABLATION N/A 01/04/2024   Procedure: ATRIAL FIBRILLATION ABLATION;  Surgeon: [name], MD;  Location: [facility] INVASIVE CV LAB;  Service: Cardiovascular;  Laterality: N/A;   HIATAL HERNIA REPAIR  04/23/2012   Procedure: HERNIA REPAIR HIATAL;  Surgeon: [name], MD;  Location: [facility] ORS;  Service: General;  Laterality:  N/A;   KNEE SURGERY Right    fracture knee cap   LAPAROSCOPIC APPENDECTOMY N/A 07/31/2018   Procedure: APPENDECTOMY LAPAROSCOPIC;  Surgeon: [name], MD;  Location: [facility] ORS;  Service: General;  Laterality: N/A;   LAPAROSCOPIC GASTRIC BANDING  04/23/2012   Procedure: LAPAROSCOPIC GASTRIC BANDING;  Surgeon: [name], MD;  Location: [facility] ORS;  Service: General;  Laterality: N/A;   LASIK     Patient Active Problem List   Diagnosis Date Noted   Mild sleep apnea 03/04/2024   PAF (paroxysmal atrial fibrillation) (HCC) 09/04/2023   Decreased libido 12/14/2022   Ectopic atrial rhythm 03/16/2022   Obesity (BMI 30.0-34.9) 03/16/2022   Dyspnea on exertion 03/16/2022   Palpitations 03/16/2022   Hepatitis 03/14/2022   Morbid obesity (HCC) 03/14/2022   Ocular rosacea 03/14/2022   Basal cell carcinoma (BCC) of left shoulder 03/14/2022   Basal cell carcinoma of face 03/14/2022   Basal cell carcinoma of right popliteal region 03/14/2022   Hemangioma of skin and subcutaneous tissue 03/14/2022   History of malignant neoplasm of skin 03/14/2022   Lentigo 03/14/2022   Viral warts 03/14/2022   Melanocytic nevi of left upper limb, including shoulder 03/14/2022   Melanocytic nevi of trunk 03/14/2022   Milia 03/14/2022   Neoplasm of uncertain behavior of skin 03/14/2022   Actinic keratosis 03/14/2022   Other seborrheic keratosis 03/14/2022   Other skin changes due to chronic exposure to nonionizing radiation 03/14/2022   Pseudofolliculitis barbae 03/14/2022   Elevated liver enzymes 07/19/2020   Medically noncompliant 07/16/2020   Post-nasal drip 07/16/2020   Pruritus ani 11/21/2018   Dysthymia 11/21/2018   Vasomotor rhinitis 11/21/2018   Night sweat 11/21/2018   Vitamin D  deficiency 09/23/2018   Chronic pain of right ankle 09/20/2018   Elevated blood sugar 09/20/2018   Alcohol use 09/20/2018   Acute appendicitis 07/31/2018   Anal inflammation 03/08/2018   Healthcare maintenance  09/10/2017   Low testosterone  09/10/2017   Leg wound, left 09/16/2014   Allergic rhinitis 09/16/2014   Lump in neck 04/22/2014   Left arm numbness 02/05/2014   Asthma 09/13/2013   Lapband APL + HH repair July 2013 04/24/2012   General medical examination 11/21/2011   Insomnia 05/03/2011   CHEST WALL PAIN, ANTERIOR 09/22/2010   GERD 06/29/2009   GASTROENTERITIS 06/29/2009   CHEST PAIN 06/29/2009   ACHILLES TENDINITIS 12/28/2008   Acute frontal sinusitis 09/01/2008   OBESITY 03/31/2008   External hemorrhoids 03/31/2008   Essential hypertension 01/17/2007   Backache 01/17/2007    PCP: Almarie Waddell KATHEE, NP  REFERRING PROVIDER: Almarie Waddell KATHEE, NP  REFERRING DIAG: [phone] (ICD-10-CM) - Chronic bilateral low back pain with left-sided sciatica  Rationale for Evaluation and Treatment: Rehabilitation  THERAPY DIAG:  Other low back pain  Other abnormalities of gait and  mobility  Muscle weakness (generalized)  ONSET DATE: At least 1 year  SUBJECTIVE:                                                                                                                                                                                           SUBJECTIVE STATEMENT: I did okay, I tried to run again and struggled but it was very humid.  From eval: .SABRASABRARecently I've lost some weight. I had surgery a couple years ago and plateaued. Last Summer I was walking and was in pretty good shape and lost more weight. Pt reports he had a recent ablation and is now cleared to return to more activity. Pt states his back pain comes and goes. Pt notes he helped his son moved and could barely walk. Sometimes it would go down his leg. Pt states he sat for a long period on a bench and when he stood up his back just felt tight. Unable to put his finger on specific examples that worsen his back. Gets massages frequently from his girlfriend which does help. Was working out with a trainer 3x/wk prior to his heart  ablation (last seen on Thanksgiving) and was working on Emergency planning/management officer.   PERTINENT HISTORY:  s/p afib and flutter ablation HTN, atrial flutter, atrial fibrillation, weekly testosterone  injections  PAIN:  Are you having pain? Yes: NPRS scale: 3/10 currently; at worst 7 or 8  Pain location: Middle low back (maybe left worse) -- can radiate down occasionally R or L leg Pain description: tightness Aggravating factors: Not sure -- sometimes standing and walking Relieving factors: move around and stretch a little  PRECAUTIONS: None  RED FLAGS: None   WEIGHT BEARING RESTRICTIONS: No  FALLS:  Has patient fallen in last 6 months? 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  DIAGNOSTIC FINDINGS:  01/23/24 lumbar x-ray IMPRESSION: Mild degenerative joint changes at L5-S1.  PATIENT SURVEYS:  Modified Oswestry Low Back Pain Disability Questionnaire: 12 / 50 = 24.0 % (eval) Modified Oswestry Low Back Pain Disability Questionnaire: 7 / 50 = 14.0 % 04/02/24  COGNITION: Overall cognitive status: Within functional limits for tasks assessed     SENSATION: WFL  MUSCLE LENGTH: Hamstrings: Right ~60 deg; Left ~60 deg Thomas test: Right ~10 deg; Left ~10 deg  POSTURE: Mild scoliosis with R iliac crest slightly higher than L  PALPATION: Mobile throughout thoracic and lumbar spine; tender to palpation and tight around lumbar paraspinals and QL  LUMBAR ROM:   AROM eval 04/02/24  Flexion 75% 90% painful  Extension 100% dull ache 100%  Right lateral flexion 1.5 above knee At knee  Left lateral flexion 1.5 above knee At knee  Right rotation 100% 100%  Left rotation 100% 100%   (Blank rows = not  tested)  LOWER EXTREMITY ROM:     Active  Right eval Left eval  Hip flexion    Hip extension    Hip abduction    Hip adduction    Hip internal rotation    Hip external rotation    Knee flexion    Knee extension    Ankle dorsiflexion    Ankle plantarflexion    Ankle inversion    Ankle eversion     (Blank rows = not tested)  LOWER EXTREMITY MMT:    MMT Right eval Left eval  Hip flexion 4- 4-  Hip extension 4- 4-  Hip abduction 3+ 3+  Hip adduction    Hip internal rotation 5 5  Hip external rotation 4+ 4+  Knee flexion 4 4  Knee extension 5 5  Ankle dorsiflexion    Ankle plantarflexion    Ankle inversion    Ankle eversion     (Blank rows = not tested)  LUMBAR SPECIAL TESTS:  Straight leg raise test: Positive, FABER test: Negative, and Trendelenburg sign: Positive  FUNCTIONAL TESTS:  Double leg lowering test: ~50 deg Plank (forearm and knees) 2 min SLS: >1 min R & L, R more unstable than L  GAIT: Distance walked: Into clinic Assistive device utilized: None Level of assistance: Complete Independence Comments: Normal reciprocal pattern  TREATMENT DATE:  04/07/24 Bike level 5 x 6 minutes with 2 power bursts Outside walk and some light jog up hill Calf stretch 60# Leg press 70# resisted gait all directions 35# lats 20# farmer carry at side and overhewad Feet on ball K2c, rotation, bridge and isometric abs Passive LE stretches   04/02/24 Recumbent bike L3 x 6 min Supine hip flexor stretch 2x 30 Supine piriformis stretch 2x30 Passive hamstring stretch contract/relax R&L Passive double knee to chest 2x30 Supine Feet on ball knees bent bridge, knees straight bridge, obliques twist knees straight Supine knees to chest and ext 3x10 Standing lumbar ext  03/13/24 Nustep level 5 x 6 minute Seated lumbar flexion green ball 2x30 Supine HS stretch w/ strap x 30 Supine piriformis stretch KTOS and figure 4 x 30 B Leg press 25lb 2x10  Pallof press blue TB  2x10 B Trunk rotation blue TB 2x10 B Foam roll to low back and glutes  03/07/24 Nustep level 5 x 6 minute Rows 25# 2x10 Lats 25# 2x10 LEg curls 25# 2x10 Green tband hip extension and abduction 2x10 Supine Feet on ball K2C, rotation, bridge, isometric abs Green tband clamshells Passive stretch LE's Updated HEP see below  02/25/24 Seated hamstring stretch x 30 Supine hip flexor stretch 2x 30 Figure 4 stretch x 30 LTR 2x30 PPT 15x5 Sidelying clamshell red TB 2x10 Prone 1/2 on mat table hip extension 2x10 Supine hip extension/bridge off EOB 2x10   02/11/24 See HEP below  Attempted bridging but pt feeling it in his back and not in glutes/hamstrings   PATIENT EDUCATION:  Education details: Exam findings, POC, initial HEP Person educated: Patient Education method: Explanation, Demonstration, and Handouts Education comprehension: verbalized understanding, returned demonstration, and needs further education  HOME EXERCISE PROGRAM: Access Code: [email or web address] URL: [email or web address] Date: 02/25/2024 Prepared by: Gellen April Marie Nonato  Exercises - Seated Hamstring Stretch  - 1 x daily - 7 x weekly - 2 sets - 30 sec hold - Supine Lower Trunk Rotation  - 1 x daily - 7 x weekly - 2 sets - 30 sec hold - Thomas Stretch on Table  - 1 x daily - 7 x weekly - 2 sets - 30 sec hold - Supine Posterior Pelvic Tilt  - 1 x daily - 7 x weekly - 1 sets - 15 reps - 5 sec hold - Clamshell with Resistance  - 1 x daily - 7 x weekly - 3 sets - 10 reps - Prone Hip Extension on Table  - 1 x daily - 7 x weekly - 2-3 sets - 10 reps - Supine Hip Extension on Bench  - 1 x daily - 7 x weekly - 2-3 sets - 10 reps Access Code: [email or web address] URL: [email or web address] Date: 03/13/2024 Prepared by: Braylin Clark  Exercises - Standing Hip Extension with  Anchored Resistance  - 1 x daily - 7 x weekly - 3 sets - 10 reps - 3 hold - Standing Hip Abduction with Anchored Resistance  - 1 x daily - 7 x weekly - 3 sets - 10 reps - 3 hold - Supine Hamstring Stretch with Doorway  - 1 x daily - 7 x weekly - 3 sets - 10 reps - 30 hold - Seated Hamstring Stretch with Chair  - 1 x daily - 7 x weekly - 3 sets - 10 reps - 30 hold - Supine Piriformis Stretch Pulling Heel to Hip  - 1 x daily - 7 x weekly - 3 sets - 10 reps - 20 hold - Gastroc Stretch on Wall  - 1 x daily - 7 x weekly - 3 sets - 10 reps - 30 hold - Supine Double Knee to Chest  - 1 x daily - 7 x weekly - 3 sets - 10 reps - 10 hold - Supine Lower Trunk Rotation  - 1 x daily - 7 x weekly - 3 sets - 10 reps - 10 hold - Supine Piriformis Stretch with Foot on Ground  - 2 x daily - 7 x weekly - 2 sets - 2 reps - 30 sec hold - Supine Figure 4 Piriformis Stretch  - 2 x daily - 7 x weekly - 2 sets - 2 reps - 30 sec hold - Supine Hamstring Stretch with Strap  - 2 x daily - 7 x weekly - 2 sets - 2 reps - 30 sec hold ASSESSMENT:  CLINICAL IMPRESSION: Patient overall is feeling better, he still gets very stiff with any sitting and does report that he does not stretch like he should at home.  We did a little more core work with farmer carry and some resisted walking.  ITB is very tight and sore.   Consistency with his HEP and stretching could improve. Goals remain ongoing and pt will benefit from continued PT sessions to fully realize his goals.   From eval: Patient is a 53 y.o. M who was seen today for physical therapy evaluation and treatment for intermittent low back. Assessment is significant  for hamstring and hip flexor tightness with weak posterior LE chain (mostly glutes) with limited lumbar ROM and increased muscle tightness in lumbar paraspinals. Trialed hip strengthening exercises this session; able to feel good muscle contraction for hip abd but pt reports not feeling enough glute or hamstring activation  with bridge or prone hip extension -- may need modifications next session. Pt will benefit from PT to address these issues to maximize his function to meet goals such as running.   OBJECTIVE IMPAIRMENTS: decreased activity tolerance, decreased coordination, decreased endurance, decreased mobility, decreased ROM, decreased strength, increased fascial restrictions, increased muscle spasms, impaired flexibility, improper body mechanics, and pain.     GOALS: Goals reviewed with patient? Yes  SHORT TERM GOALS: Target date: 03/03/2024   Pt will be ind with initial HEP Baseline: Goal status: MET- 03/13/24  2.  Pt will demo improved hamstring length with a SLR of at least 70 deg Baseline: ~60 deg 04/02/24: ~80 deg Goal status: MET  3.  Pt will demo improved hip flexor length with ability to obtain neutral hip ext during Debby Testing Baseline: 10 deg from neutral 04/02/24: able to touch mat table with Debby testing Goal status: MET   LONG TERM GOALS: NEW Target date: 05/14/2024    Pt will be ind with management and progression of HEP Baseline:  Goal status: IN PROGRESS 04/02/24 - pt is not consistent  2.  Pt will be able to deadlift at least 25# with good body mechanics for increased glute and hamstring strength Baseline:  Goal status: IN PROGRESS- LBP with hip hinge motion  3.  Pt will report improved overall pain by >/=50% (intensity, frequency and duration) Baseline:  04/02/24: 10% improvement Goal status: IN PROGRESS  4.  Pt will have improved Modified Oswestry score to </=12% to demo MCID Baseline: Modified Oswestry Low Back Pain Disability Questionnaire: 12 / 50 = 24.0 % 04/02/24: Modified Oswestry Low Back Pain Disability Questionnaire: 7 / 50 = 14.0 % Goal status: IN PROGRESS  5.  Pt will demo full and pain free lumbar ROM for improved tightness Baseline:  04/02/24: see ROM chart above Goal status: IN PROGRESS  6. Pt will be able to jog for at least 10 min without exacerbation  of pain for pt's personal goals to start running Baseline:  Goal status: PARTIALLY MET 04/02/24 - able to do 5k but with some pain and soreness   PLAN:  PT FREQUENCY: 1-2x/week  PT DURATION: 6 weeks  PLANNED INTERVENTIONS: 97164- PT Re-evaluation, 97750- Physical Performance Testing, 97110-Therapeutic exercises, 97530- Therapeutic activity, 97112- Neuromuscular re-education, 97535- Self Care, [id number]- Manual therapy, [id number]- Gait training, [phone]- Aquatic Therapy, [phone]- Electrical stimulation (unattended), [phone]- Ionotophoresis 4mg /ml Dexamethasone , Patient/Family education, Balance training, Stair training, Taping, Dry Needling, Joint mobilization, Spinal mobilization, Cryotherapy, and Moist heat.  PLAN FOR NEXT SESSION: may try traction   OBADIAH OZELL ORN, PT 04/07/2024, 8:12 AM

## 2024-04-08 ENCOUNTER — Other Ambulatory Visit: Payer: Self-pay

## 2024-04-08 ENCOUNTER — Other Ambulatory Visit (HOSPITAL_BASED_OUTPATIENT_CLINIC_OR_DEPARTMENT_OTHER): Payer: Self-pay

## 2024-04-10 ENCOUNTER — Other Ambulatory Visit: Payer: Self-pay

## 2024-04-10 ENCOUNTER — Other Ambulatory Visit (HOSPITAL_BASED_OUTPATIENT_CLINIC_OR_DEPARTMENT_OTHER): Payer: Self-pay

## 2024-04-14 ENCOUNTER — Ambulatory Visit: Admitting: Physical Therapy

## 2024-04-14 ENCOUNTER — Encounter: Payer: Self-pay | Admitting: Physical Therapy

## 2024-04-14 DIAGNOSIS — R2689 Other abnormalities of gait and mobility: Secondary | ICD-10-CM | POA: Diagnosis not present

## 2024-04-14 DIAGNOSIS — M6281 Muscle weakness (generalized): Secondary | ICD-10-CM

## 2024-04-14 DIAGNOSIS — M5459 Other low back pain: Secondary | ICD-10-CM | POA: Diagnosis not present

## 2024-04-14 NOTE — Therapy (Signed)
 OUTPATIENT PHYSICAL THERAPY TREATMENT/RECERTIFICATION    Patient Name: Gilbert [name] MRN: [id number] DOB:04-10-1971, 53 y.o., male Today's Date: 04/14/2024  END OF SESSION:  PT End of Session - 04/14/24 0805     Visit Number 7    Number of Visits 11    Date for PT Re-Evaluation 05/14/24    Authorization Type BCBS    PT Start Time 0800    PT Stop Time 0855    PT Time Calculation (min) 55 min    Activity Tolerance Patient tolerated treatment well    Behavior During Therapy [facility] for tasks assessed/performed             Past Medical History:  Diagnosis Date   ACHILLES TENDINITIS 12/28/2008   Qualifier: Diagnosis of  By: [name], [name]     Actinic keratosis 03/14/2022   Acute appendicitis 07/31/2018   Acute frontal sinusitis 09/01/2008   Qualifier: Diagnosis of  By: Antonio DOJamee     Alcohol use 09/20/2018   Allergic rhinitis 09/16/2014   Anal inflammation 03/08/2018   Asthma 09/13/2013   Backache 01/17/2007   Qualifier: Diagnosis of   By: [name], [name]      IMO SNOMED Dx Update Oct 2024     Basal cell carcinoma (BCC) of left shoulder 03/14/2022   Basal cell carcinoma of face 03/14/2022   Basal cell carcinoma of right popliteal region 03/14/2022   CHEST PAIN 06/29/2009   Qualifier: Diagnosis of  By: [name], [facility] WALL PAIN, ANTERIOR 09/22/2010   Qualifier: Diagnosis of  By: [name], [name]     Chronic pain of right ankle 09/20/2018   Decreased libido 12/14/2022   occasional ED.     Dyspnea on exertion 03/16/2022   Dysthymia 11/21/2018   Ectopic atrial rhythm 03/16/2022   Elevated blood sugar 09/20/2018   Elevated liver enzymes 07/19/2020   Essential hypertension 01/17/2007   Qualifier: Diagnosis of  By: [name], [name]     External hemorrhoids 03/31/2008   Qualifier: Diagnosis of  By: [name], [name]    GASTROENTERITIS 06/29/2009   Qualifier: Diagnosis of  By: [name], [name]  11/21/2011   GERD 06/29/2009   Qualifier: Diagnosis of  By: [name], [facility]     Healthcare maintenance 09/10/2017   Hemangioma of skin and subcutaneous tissue 03/14/2022   Hepatitis    unknown type many yrs ago - no residual problems   History of malignant neoplasm of skin 03/14/2022   Insomnia    Lapband APL + [facility] repair July 2013 04/24/2012   Left arm numbness 02/05/2014   Leg wound, left 09/16/2014   Lentigo 03/14/2022   Low testosterone  09/10/2017   Lump in neck 04/22/2014   Medically noncompliant 07/16/2020   Melanocytic nevi of left upper limb, including shoulder 03/14/2022   Melanocytic nevi of trunk 03/14/2022   Mild sleep apnea 03/04/2024   02/08/24 Home sleep study ([facility]): AHI 5   Milia 03/14/2022   Morbid obesity (HCC)    Neoplasm of uncertain behavior of skin 03/14/2022   Night sweat 11/21/2018   OBESITY 03/31/2008   Qualifier: Diagnosis of  By: [name], [name]    Obesity (BMI 30.0-34.9) 03/16/2022   Ocular rosacea    Other seborrheic keratosis 03/14/2022   Other skin changes due to chronic exposure to nonionizing radiation 03/14/2022   Palpitations 03/16/2022   Post-nasal drip 07/16/2020   Pruritus  ani 11/21/2018   Pseudofolliculitis barbae 03/14/2022   Vasomotor rhinitis 11/21/2018   Viral warts 03/14/2022   Vitamin D  deficiency 09/23/2018   Past Surgical History:  Procedure Laterality Date   ANKLE ARTHROSCOPY WITH RECONSTRUCTION Right 09/26/2018   Procedure: ANKLE DEBRIDEMENT WITH POSSIBLE LATERAL LIGAMENT  RECONSTRUCTION;  Surgeon: [name], MD;  Location: [facility] OR;  Service: Orthopedics;  Laterality: Right;   ATRIAL FIBRILLATION ABLATION N/A 01/04/2024   Procedure: ATRIAL FIBRILLATION ABLATION;  Surgeon: [name], MD;  Location: [facility] INVASIVE CV LAB;  Service: Cardiovascular;  Laterality: N/A;   HIATAL HERNIA REPAIR  04/23/2012   Procedure: HERNIA REPAIR HIATAL;  Surgeon: [name], MD;  Location: [facility] ORS;  Service:  General;  Laterality: N/A;   KNEE SURGERY Right    fracture knee cap   LAPAROSCOPIC APPENDECTOMY N/A 07/31/2018   Procedure: APPENDECTOMY LAPAROSCOPIC;  Surgeon: [name], MD;  Location: [facility] ORS;  Service: General;  Laterality: N/A;   LAPAROSCOPIC GASTRIC BANDING  04/23/2012   Procedure: LAPAROSCOPIC GASTRIC BANDING;  Surgeon: [name], MD;  Location: [facility] ORS;  Service: General;  Laterality: N/A;   LASIK     Patient Active Problem List   Diagnosis Date Noted   Mild sleep apnea 03/04/2024   PAF (paroxysmal atrial fibrillation) (HCC) 09/04/2023   Decreased libido 12/14/2022   Ectopic atrial rhythm 03/16/2022   Obesity (BMI 30.0-34.9) 03/16/2022   Dyspnea on exertion 03/16/2022   Palpitations 03/16/2022   Hepatitis 03/14/2022   Morbid obesity (HCC) 03/14/2022   Ocular rosacea 03/14/2022   Basal cell carcinoma (BCC) of left shoulder 03/14/2022   Basal cell carcinoma of face 03/14/2022   Basal cell carcinoma of right popliteal region 03/14/2022   Hemangioma of skin and subcutaneous tissue 03/14/2022   History of malignant neoplasm of skin 03/14/2022   Lentigo 03/14/2022   Viral warts 03/14/2022   Melanocytic nevi of left upper limb, including shoulder 03/14/2022   Melanocytic nevi of trunk 03/14/2022   Milia 03/14/2022   Neoplasm of uncertain behavior of skin 03/14/2022   Actinic keratosis 03/14/2022   Other seborrheic keratosis 03/14/2022   Other skin changes due to chronic exposure to nonionizing radiation 03/14/2022   Pseudofolliculitis barbae 03/14/2022   Elevated liver enzymes 07/19/2020   Medically noncompliant 07/16/2020   Post-nasal drip 07/16/2020   Pruritus ani 11/21/2018   Dysthymia 11/21/2018   Vasomotor rhinitis 11/21/2018   Night sweat 11/21/2018   Vitamin D  deficiency 09/23/2018   Chronic pain of right ankle 09/20/2018   Elevated blood sugar 09/20/2018   Alcohol use 09/20/2018   Acute appendicitis 07/31/2018   Anal inflammation 03/08/2018    Healthcare maintenance 09/10/2017   Low testosterone  09/10/2017   Leg wound, left 09/16/2014   Allergic rhinitis 09/16/2014   Lump in neck 04/22/2014   Left arm numbness 02/05/2014   Asthma 09/13/2013   Lapband APL + HH repair July 2013 04/24/2012   General medical examination 11/21/2011   Insomnia 05/03/2011   CHEST WALL PAIN, ANTERIOR 09/22/2010   GERD 06/29/2009   GASTROENTERITIS 06/29/2009   CHEST PAIN 06/29/2009   ACHILLES TENDINITIS 12/28/2008   Acute frontal sinusitis 09/01/2008   OBESITY 03/31/2008   External hemorrhoids 03/31/2008   Essential hypertension 01/17/2007   Backache 01/17/2007    PCP: Almarie Waddell KATHEE, NP  REFERRING PROVIDER: Almarie Waddell KATHEE, NP  REFERRING DIAG: [phone] (ICD-10-CM) - Chronic bilateral low back pain with left-sided sciatica  Rationale for Evaluation and Treatment: Rehabilitation  THERAPY DIAG:  Other  low back pain  Other abnormalities of gait and mobility  Muscle weakness (generalized)  ONSET DATE: At least 1 year  SUBJECTIVE:                                                                                                                                                                                           SUBJECTIVE STATEMENT: I went to the Stretch Zone I am sore all over.  My knees hurt.  From eval: .SABRASABRARecently I've lost some weight. I had surgery a couple years ago and plateaued. Last Summer I was walking and was in pretty good shape and lost more weight. Pt reports he had a recent ablation and is now cleared to return to more activity. Pt states his back pain comes and goes. Pt notes he helped his son moved and could barely walk. Sometimes it would go down his leg. Pt states he sat for a long period on a bench and when he stood up his back just felt tight. Unable to put his finger on specific examples that worsen his back. Gets massages frequently from his girlfriend which does help. Was working out with a trainer 3x/wk  prior to his heart ablation (last seen on Thanksgiving) and was working on Emergency planning/management officer.   PERTINENT HISTORY:  s/p afib and flutter ablation HTN, atrial flutter, atrial fibrillation, weekly testosterone  injections  PAIN:  Are you having pain? Yes: NPRS scale: 3/10 currently; at worst 7 or 8  Pain location: Middle low back (maybe left worse) -- can radiate down occasionally R or L leg Pain description: tightness Aggravating factors: Not sure -- sometimes standing and walking Relieving factors: move around and stretch a little  PRECAUTIONS: None  RED FLAGS: None   WEIGHT BEARING RESTRICTIONS: No  FALLS:  Has patient fallen in last 6 months? 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  DIAGNOSTIC FINDINGS:  01/23/24 lumbar x-ray IMPRESSION: Mild degenerative joint changes at L5-S1.  PATIENT SURVEYS:  Modified Oswestry Low Back Pain Disability Questionnaire: 12 / 50 = 24.0 % (eval) Modified Oswestry Low Back Pain Disability Questionnaire: 7 / 50 = 14.0 % 04/02/24  COGNITION: Overall cognitive status: Within functional limits for tasks assessed     SENSATION: WFL  MUSCLE LENGTH: Hamstrings: Right ~60 deg; Left ~60 deg Thomas test: Right ~10 deg; Left ~10 deg  POSTURE: Mild  scoliosis with R iliac crest slightly higher than L  PALPATION: Mobile throughout thoracic and lumbar spine; tender to palpation and tight around lumbar paraspinals and QL  LUMBAR ROM:   AROM eval 04/02/24  Flexion 75% 90% painful  Extension 100% dull ache 100%  Right lateral flexion 1.5 above knee At knee  Left lateral flexion 1.5 above knee At knee  Right rotation 100% 100%  Left rotation 100% 100%    (Blank rows = not tested)  LOWER EXTREMITY ROM:     Active  Right eval Left eval  Hip flexion    Hip extension    Hip abduction    Hip adduction    Hip internal rotation    Hip external rotation    Knee flexion    Knee extension    Ankle dorsiflexion    Ankle plantarflexion    Ankle inversion    Ankle eversion     (Blank rows = not tested)  LOWER EXTREMITY MMT:    MMT Right eval Left eval  Hip flexion 4- 4-  Hip extension 4- 4-  Hip abduction 3+ 3+  Hip adduction    Hip internal rotation 5 5  Hip external rotation 4+ 4+  Knee flexion 4 4  Knee extension 5 5  Ankle dorsiflexion    Ankle plantarflexion    Ankle inversion    Ankle eversion     (Blank rows = not tested)  LUMBAR SPECIAL TESTS:  Straight leg raise test: Positive, FABER test: Negative, and Trendelenburg sign: Positive  FUNCTIONAL TESTS:  Double leg lowering test: ~50 deg Plank (forearm and knees) 2 min SLS: >1 min R & L, R more unstable than L  GAIT: Distance walked: Into clinic Assistive device utilized: None Level of assistance: Complete Independence Comments: Normal reciprocal pattern  TREATMENT DATE:  04/14/24 Bike level 5 x 6 minutes 35# lats 2 x10 Hip abduction and extension 10# 2x10 20# overhead carry and farmer carry Passive stretch bilateral LE and trunk Feet on ball K2C, rotation, small bridges, isometric abs Static pelvic traction 75#  04/07/24 Bike level 5 x 6 minutes with 2 power bursts Outside walk and some light jog up hill Calf stretch 60# Leg press 70# resisted gait all directions 35# lats 20# farmer carry at side and overhewad Feet on ball K2c, rotation, bridge and isometric abs Passive LE stretches   04/02/24 Recumbent bike L3 x 6 min Supine hip flexor stretch 2x 30 Supine piriformis stretch 2x30 Passive hamstring stretch contract/relax R&L Passive double knee to chest 2x30 Supine Feet on ball knees bent bridge, knees straight bridge, obliques twist knees  straight Supine knees to chest and ext 3x10 Standing lumbar ext  03/13/24 Nustep level 5 x 6 minute Seated lumbar flexion green ball 2x30 Supine HS stretch w/ strap x 30 Supine piriformis stretch KTOS and figure 4 x 30 B Leg press 25lb 2x10  Pallof press blue TB 2x10 B Trunk rotation blue TB 2x10 B Foam roll to low back and glutes  03/07/24 Nustep level 5 x 6 minute Rows 25# 2x10 Lats 25# 2x10 LEg curls 25# 2x10 Green tband hip extension and abduction 2x10 Supine Feet on ball K2C, rotation, bridge, isometric abs Green tband clamshells Passive stretch LE's Updated HEP see below  02/25/24 Seated hamstring stretch x 30 Supine hip flexor stretch 2x 30 Figure 4 stretch x 30 LTR 2x30 PPT 15x5 Sidelying clamshell red TB 2x10 Prone 1/2 on mat table hip extension 2x10 Supine hip extension/bridge off EOB  2x10   02/11/24 See HEP below                                                                                                                              Attempted bridging but pt feeling it in his back and not in glutes/hamstrings   PATIENT EDUCATION:  Education details: Exam findings, POC, initial HEP Person educated: Patient Education method: Explanation, Demonstration, and Handouts Education comprehension: verbalized understanding, returned demonstration, and needs further education  HOME EXERCISE PROGRAM: Access Code: [email or web address] URL: [email or web address] Date: 02/25/2024 Prepared by: Gellen April Marie Nonato  Exercises - Seated Hamstring Stretch  - 1 x daily - 7 x weekly - 2 sets - 30 sec hold - Supine Lower Trunk Rotation  - 1 x daily - 7 x weekly - 2 sets - 30 sec hold - Thomas Stretch on Table  - 1 x daily - 7 x weekly - 2 sets - 30 sec hold - Supine Posterior Pelvic Tilt  - 1 x daily - 7 x weekly - 1 sets - 15 reps - 5 sec hold - Clamshell with Resistance  - 1 x daily - 7 x weekly - 3 sets - 10 reps - Prone Hip Extension on Table  - 1 x  daily - 7 x weekly - 2-3 sets - 10 reps - Supine Hip Extension on Bench  - 1 x daily - 7 x weekly - 2-3 sets - 10 reps Access Code: [email or web address] URL: [email or web address] Date: 03/13/2024 Prepared by: Braylin Clark  Exercises - Standing Hip Extension with Anchored Resistance  - 1 x daily - 7 x weekly - 3 sets - 10 reps - 3 hold - Standing Hip Abduction with Anchored Resistance  - 1 x daily - 7 x weekly - 3 sets - 10 reps - 3 hold - Supine Hamstring Stretch with Doorway  - 1 x daily - 7 x weekly - 3 sets - 10 reps - 30 hold - Seated Hamstring Stretch with Chair  - 1 x daily - 7 x weekly - 3 sets - 10 reps - 30 hold - Supine Piriformis Stretch Pulling Heel to Hip  - 1 x daily - 7 x weekly - 3 sets - 10 reps - 20 hold - Gastroc Stretch on Wall  - 1 x daily - 7 x weekly - 3 sets - 10 reps - 30 hold - Supine Double Knee to Chest  - 1 x daily - 7 x weekly - 3 sets - 10 reps - 10 hold - Supine Lower Trunk Rotation  - 1 x daily - 7 x weekly - 3 sets - 10 reps - 10 hold - Supine Piriformis Stretch with Foot on Ground  - 2 x daily - 7 x weekly - 2 sets - 2 reps - 30 sec hold - Supine Figure 4 Piriformis Stretch  - 2 x daily - 7 x weekly - 2 sets - 2 reps - 30  sec hold - Supine Hamstring Stretch with Strap  - 2 x daily - 7 x weekly - 2 sets - 2 reps - 30 sec hold ASSESSMENT:  CLINICAL IMPRESSION: Patient is sore today went to the stretch zone yesterday and is sore, also c/o some knee pain, he is very tight and sore in the ITB and piriformis, he is planning on trying to run a 5K this weekend pt will benefit from continued PT sessions to fully realize his goals.   From eval: Patient is a 53 y.o. M who was seen today for physical therapy evaluation and treatment for intermittent low back. Assessment is significant for hamstring and hip flexor tightness with weak posterior LE chain (mostly glutes) with limited lumbar ROM and increased muscle tightness in lumbar paraspinals. Trialed hip  strengthening exercises this session; able to feel good muscle contraction for hip abd but pt reports not feeling enough glute or hamstring activation with bridge or prone hip extension -- may need modifications next session. Pt will benefit from PT to address these issues to maximize his function to meet goals such as running.   OBJECTIVE IMPAIRMENTS: decreased activity tolerance, decreased coordination, decreased endurance, decreased mobility, decreased ROM, decreased strength, increased fascial restrictions, increased muscle spasms, impaired flexibility, improper body mechanics, and pain.     GOALS: Goals reviewed with patient? Yes  SHORT TERM GOALS: Target date: 03/03/2024   Pt will be ind with initial HEP Baseline: Goal status: MET- 03/13/24  2.  Pt will demo improved hamstring length with a SLR of at least 70 deg Baseline: ~60 deg 04/02/24: ~80 deg Goal status: MET  3.  Pt will demo improved hip flexor length with ability to obtain neutral hip ext during Debby Testing Baseline: 10 deg from neutral 04/02/24: able to touch mat table with Debby testing Goal status: MET   LONG TERM GOALS: NEW Target date: 05/14/2024    Pt will be ind with management and progression of HEP Baseline:  Goal status: IN PROGRESS 04/02/24 - pt is not consistent  2.  Pt will be able to deadlift at least 25# with good body mechanics for increased glute and hamstring strength Baseline:  Goal status: IN PROGRESS- LBP with hip hinge motion  3.  Pt will report improved overall pain by >/=50% (intensity, frequency and duration) Baseline:  04/02/24: 10% improvement Goal status: IN PROGRESS  4.  Pt will have improved Modified Oswestry score to </=12% to demo MCID Baseline: Modified Oswestry Low Back Pain Disability Questionnaire: 12 / 50 = 24.0 % 04/02/24: Modified Oswestry Low Back Pain Disability Questionnaire: 7 / 50 = 14.0 % Goal status: IN PROGRESS  5.  Pt will demo full and pain free lumbar ROM for  improved tightness Baseline:  04/02/24: see ROM chart above Goal status: IN PROGRESS  6. Pt will be able to jog for at least 10 min without exacerbation of pain for pt's personal goals to start running Baseline:  Goal status: PARTIALLY MET 721/25 - able to do 5k but with some pain and soreness   PLAN:  PT FREQUENCY: 1-2x/week  PT DURATION: 6 weeks  PLANNED INTERVENTIONS: 97164- PT Re-evaluation, 97750- Physical Performance Testing, 97110-Therapeutic exercises, 97530- Therapeutic activity, 97112- Neuromuscular re-education, 97535- Self Care, 02859- Manual therapy, [id number]- Gait training, [phone]- Aquatic Therapy, [phone]- Electrical stimulation (unattended), [phone]- Ionotophoresis 4mg /ml Dexamethasone , Patient/Family education, Balance training, Stair training, Taping, Dry Needling, Joint mobilization, Spinal mobilization, Cryotherapy, and Moist heat.  PLAN FOR NEXT SESSION: see how traction did  OBADIAH OZELL ORN, PT 04/14/2024, 8:05 AM

## 2024-04-18 DIAGNOSIS — G4700 Insomnia, unspecified: Secondary | ICD-10-CM | POA: Diagnosis not present

## 2024-04-18 DIAGNOSIS — E291 Testicular hypofunction: Secondary | ICD-10-CM | POA: Diagnosis not present

## 2024-04-18 DIAGNOSIS — E559 Vitamin D deficiency, unspecified: Secondary | ICD-10-CM | POA: Diagnosis not present

## 2024-04-18 DIAGNOSIS — Z0000 Encounter for general adult medical examination without abnormal findings: Secondary | ICD-10-CM | POA: Diagnosis not present

## 2024-04-18 DIAGNOSIS — R7982 Elevated C-reactive protein (CRP): Secondary | ICD-10-CM | POA: Diagnosis not present

## 2024-04-21 ENCOUNTER — Encounter: Payer: Self-pay | Admitting: Physical Therapy

## 2024-04-21 ENCOUNTER — Other Ambulatory Visit: Payer: Self-pay

## 2024-04-21 ENCOUNTER — Ambulatory Visit: Admitting: Cardiology

## 2024-04-21 ENCOUNTER — Ambulatory Visit: Admitting: Physical Therapy

## 2024-04-21 ENCOUNTER — Encounter: Payer: Self-pay | Admitting: Cardiology

## 2024-04-21 ENCOUNTER — Ambulatory Visit: Attending: Cardiology | Admitting: Cardiology

## 2024-04-21 VITALS — BP 124/82 | HR 48 | Ht 74.0 in | Wt 204.0 lb

## 2024-04-21 DIAGNOSIS — R2689 Other abnormalities of gait and mobility: Secondary | ICD-10-CM

## 2024-04-21 DIAGNOSIS — M6281 Muscle weakness (generalized): Secondary | ICD-10-CM

## 2024-04-21 DIAGNOSIS — M5459 Other low back pain: Secondary | ICD-10-CM | POA: Diagnosis not present

## 2024-04-21 DIAGNOSIS — I480 Paroxysmal atrial fibrillation: Secondary | ICD-10-CM | POA: Diagnosis not present

## 2024-04-21 DIAGNOSIS — I10 Essential (primary) hypertension: Secondary | ICD-10-CM

## 2024-04-21 NOTE — Progress Notes (Signed)
  Electrophysiology Office Note:   Date:  04/21/2024  ID:  Gilbert [name], DOB [birth year], MRN [id number]  Primary Cardiologist: Gilbert JONELLE Crape, MD Primary Heart Failure: None Electrophysiologist: Gilbert Leatherbury Gladis Norton, MD      History of Present Illness:   Gilbert [name] is a 53 y.o. male with h/o hypertension, atrial fibrillation, obesity seen today for routine electrophysiology follow-up s/p Ablation.  Since last being seen in our clinic the patient reports doing overall well.  He has no chest pain or shortness of breath.  He has noted no further episodes of atrial fibrillation since his ablation.  He has been exercising without issue.  He has no acute complaints at this time.  he denies chest pain, palpitations, dyspnea, PND, orthopnea, nausea, vomiting, dizziness, syncope, edema, weight gain, or early satiety.    Review of systems complete and found to be negative unless listed in HPI.   EP Information / Studies Reviewed:    EKG is ordered today. Personal review as below.  EKG Interpretation Date/Time:  Monday April 21 2024 15:51:09 EDT Ventricular Rate:  48 PR Interval:  146 QRS Duration:  90 QT Interval:  472 QTC Calculation: 421 R Axis:   68  Text Interpretation: Sinus bradycardia When compared with ECG of 01-Feb-2024 09:03, No significant change was found Confirmed by Gilbert [name] ([id number]) on 04/21/2024 3:55:51 PM   Risk Assessment/Calculations:    CHA2DS2-VASc Score = 1   This indicates a 0.6% annual risk of stroke. The patient's score is based upon: CHF History: 0 HTN History: 1 Diabetes History: 0 Stroke History: 0 Vascular Disease History: 0 Age Score: 0 Gender Score: 0             Physical Exam:   VS:  BP 124/82   Pulse (!) 48   Ht 6' 2 (1.88 m)   Wt 204 lb (92.5 kg)   SpO2 98%   BMI 26.19 kg/m    Wt Readings from Last 3 Encounters:  04/21/24 204 lb (92.5 kg)  02/01/24 216 lb 3.2 oz (98.1 kg)  01/18/24 210 lb (95.3 kg)     GEN: Well nourished,  well developed in no acute distress NECK: No JVD; No carotid bruits CARDIAC: Regular rate and rhythm, no murmurs, rubs, gallops RESPIRATORY:  Clear to auscultation without rales, wheezing or rhonchi  ABDOMEN: Soft, non-tender, non-distended EXTREMITIES:  No edema; No deformity   ASSESSMENT AND PLAN:    1.  Paroxysmal atrial fibrillation/flutter: Post ablation 01/04/2024.  He has had no further atrial fibrillation or flutter since his ablation.  He has been doing well without complaints.  Gilbert [name] stop Eliquis , flecainide , diltiazem .  2.  Hypertension: Well-controlled  Follow up with Dr. Norton in 12 months  Signed, Gilbert Hackleman Gladis Norton, MD

## 2024-04-21 NOTE — Patient Instructions (Signed)
 Medication Instructions:  Your physician has recommended you make the following change in your medication:  STOP Eliquis  STOP Diltiazem  STOP Flecanide  *If you need a refill on your cardiac medications before your next appointment, please call your pharmacy*  Lab Work: None ordered   Testing/Procedures: None ordered  Follow-Up: At [facility], you and your health needs are our priority.  As part of our continuing mission to provide you with exceptional heart care, our providers are all part of one team.  This team includes your primary Cardiologist (physician) and Advanced Practice Providers or APPs (Physician Assistants and Nurse Practitioners) who all work together to provide you with the care you need, when you need it.  Your next appointment:   1 year(s)  Provider:   Soyla Norton, MD     Thank you for choosing [facility]!!   Maeola Domino, RN [phone]

## 2024-04-21 NOTE — Therapy (Signed)
 OUTPATIENT PHYSICAL THERAPY TREATMENT    Patient Name: Gilbert [name] MRN: [id number] DOB:1970/12/27, 53 y.o., male Today's Date: 04/21/2024  END OF SESSION:  PT End of Session - 04/21/24 0757     Visit Number 8    Number of Visits 11    Date for PT Re-Evaluation 05/14/24    Authorization Type BCBS    PT Start Time 0757    PT Stop Time 0842    PT Time Calculation (min) 45 min    Activity Tolerance Patient tolerated treatment well    Behavior During Therapy [facility] for tasks assessed/performed             Past Medical History:  Diagnosis Date   ACHILLES TENDINITIS 12/28/2008   Qualifier: Diagnosis of  By: [name], [name]     Actinic keratosis 03/14/2022   Acute appendicitis 07/31/2018   Acute frontal sinusitis 09/01/2008   Qualifier: Diagnosis of  By: [name]     Alcohol use 09/20/2018   Allergic rhinitis 09/16/2014   Anal inflammation 03/08/2018   Asthma 09/13/2013   Backache 01/17/2007   Qualifier: Diagnosis of   By: [name], [name]      IMO SNOMED Dx Update Oct 2024     Basal cell carcinoma (BCC) of left shoulder 03/14/2022   Basal cell carcinoma of face 03/14/2022   Basal cell carcinoma of right popliteal region 03/14/2022   CHEST PAIN 06/29/2009   Qualifier: Diagnosis of  By: [name], [facility] WALL PAIN, ANTERIOR 09/22/2010   Qualifier: Diagnosis of  By: [name], [name]     Chronic pain of right ankle 09/20/2018   Decreased libido 12/14/2022   occasional ED.     Dyspnea on exertion 03/16/2022   Dysthymia 11/21/2018   Ectopic atrial rhythm 03/16/2022   Elevated blood sugar 09/20/2018   Elevated liver enzymes 07/19/2020   Essential hypertension 01/17/2007   Qualifier: Diagnosis of  By: [name], [name]     External hemorrhoids 03/31/2008   Qualifier: Diagnosis of  By: [name], [name]    GASTROENTERITIS 06/29/2009   Qualifier: Diagnosis of  By: [name], [name] 11/21/2011   GERD  06/29/2009   Qualifier: Diagnosis of  By: [name], [facility]     Healthcare maintenance 09/10/2017   Hemangioma of skin and subcutaneous tissue 03/14/2022   Hepatitis    unknown type many yrs ago - no residual problems   History of malignant neoplasm of skin 03/14/2022   Insomnia    Lapband APL + [facility] Xxx repair July 2013 04/24/2012   Left arm numbness 02/05/2014   Leg wound, left 09/16/2014   Lentigo 03/14/2022   Low testosterone  09/10/2017   Lump in neck 04/22/2014   Medically noncompliant 07/16/2020   Melanocytic nevi of left upper limb, including shoulder 03/14/2022   Melanocytic nevi of trunk 03/14/2022   Mild sleep apnea 03/04/2024   02/08/24 Home sleep study ([facility]): AHI 5   Milia 03/14/2022   Morbid obesity (HCC)    Neoplasm of uncertain behavior of skin 03/14/2022   Night sweat 11/21/2018   OBESITY 03/31/2008   Qualifier: Diagnosis of  By: [name], [name]    Obesity (BMI 30.0-34.9) 03/16/2022   Ocular rosacea    Other seborrheic keratosis 03/14/2022   Other skin changes due to chronic exposure to nonionizing radiation 03/14/2022   Palpitations 03/16/2022   Post-nasal drip 07/16/2020   Pruritus  ani 11/21/2018   Pseudofolliculitis barbae 03/14/2022   Vasomotor rhinitis 11/21/2018   Viral warts 03/14/2022   Vitamin D  deficiency 09/23/2018   Past Surgical History:  Procedure Laterality Date   ANKLE ARTHROSCOPY WITH RECONSTRUCTION Right 09/26/2018   Procedure: ANKLE DEBRIDEMENT WITH POSSIBLE LATERAL LIGAMENT  RECONSTRUCTION;  Surgeon: [name], MD;  Location: [facility] OR;  Service: Orthopedics;  Laterality: Right;   ATRIAL FIBRILLATION ABLATION N/A 01/04/2024   Procedure: ATRIAL FIBRILLATION ABLATION;  Surgeon: [name], MD;  Location: [facility] INVASIVE CV LAB;  Service: Cardiovascular;  Laterality: N/A;   HIATAL HERNIA REPAIR  04/23/2012   Procedure: HERNIA REPAIR HIATAL;  Surgeon: [name], MD;  Location: [facility] ORS;  Service: General;  Laterality:  N/A;   KNEE SURGERY Right    fracture knee cap   LAPAROSCOPIC APPENDECTOMY N/A 07/31/2018   Procedure: APPENDECTOMY LAPAROSCOPIC;  Surgeon: [name], MD;  Location: [facility] ORS;  Service: General;  Laterality: N/A;   LAPAROSCOPIC GASTRIC BANDING  04/23/2012   Procedure: LAPAROSCOPIC GASTRIC BANDING;  Surgeon: [name], MD;  Location: [facility] ORS;  Service: General;  Laterality: N/A;   LASIK     Patient Active Problem List   Diagnosis Date Noted   Mild sleep apnea 03/04/2024   PAF (paroxysmal atrial fibrillation) (HCC) 09/04/2023   Decreased libido 12/14/2022   Ectopic atrial rhythm 03/16/2022   Obesity (BMI 30.0-34.9) 03/16/2022   Dyspnea on exertion 03/16/2022   Palpitations 03/16/2022   Hepatitis 03/14/2022   Morbid obesity (HCC) 03/14/2022   Ocular rosacea 03/14/2022   Basal cell carcinoma (BCC) of left shoulder 03/14/2022   Basal cell carcinoma of face 03/14/2022   Basal cell carcinoma of right popliteal region 03/14/2022   Hemangioma of skin and subcutaneous tissue 03/14/2022   History of malignant neoplasm of skin 03/14/2022   Lentigo 03/14/2022   Viral warts 03/14/2022   Melanocytic nevi of left upper limb, including shoulder 03/14/2022   Melanocytic nevi of trunk 03/14/2022   Milia 03/14/2022   Neoplasm of uncertain behavior of skin 03/14/2022   Actinic keratosis 03/14/2022   Other seborrheic keratosis 03/14/2022   Other skin changes due to chronic exposure to nonionizing radiation 03/14/2022   Pseudofolliculitis barbae 03/14/2022   Elevated liver enzymes 07/19/2020   Medically noncompliant 07/16/2020   Post-nasal drip 07/16/2020   Pruritus ani 11/21/2018   Dysthymia 11/21/2018   Vasomotor rhinitis 11/21/2018   Night sweat 11/21/2018   Vitamin D  deficiency 09/23/2018   Chronic pain of right ankle 09/20/2018   Elevated blood sugar 09/20/2018   Alcohol use 09/20/2018   Acute appendicitis 07/31/2018   Anal inflammation 03/08/2018   Healthcare maintenance  09/10/2017   Low testosterone  09/10/2017   Leg wound, left 09/16/2014   Allergic rhinitis 09/16/2014   Lump in neck 04/22/2014   Left arm numbness 02/05/2014   Asthma 09/13/2013   Lapband APL + HH repair July 2013 04/24/2012   General medical examination 11/21/2011   Insomnia 05/03/2011   CHEST WALL PAIN, ANTERIOR 09/22/2010   GERD 06/29/2009   GASTROENTERITIS 06/29/2009   CHEST PAIN 06/29/2009   ACHILLES TENDINITIS 12/28/2008   Acute frontal sinusitis 09/01/2008   OBESITY 03/31/2008   External hemorrhoids 03/31/2008   Essential hypertension 01/17/2007   Backache 01/17/2007    PCP: Almarie Waddell KATHEE, NP  REFERRING PROVIDER: Almarie Waddell KATHEE, NP  REFERRING DIAG: [phone] (ICD-10-CM) - Chronic bilateral low back pain with left-sided sciatica  Rationale for Evaluation and Treatment: Rehabilitation  THERAPY DIAG:  Other  low back pain  Other abnormalities of gait and mobility  Muscle weakness (generalized)  ONSET DATE: At least 1 year  SUBJECTIVE:                                                                                                                                                                                           SUBJECTIVE STATEMENT: I ran a 5k this weekend, I really struggles, I was very sore after the stretch zone and then coming here.  From eval: .SABRASABRARecently I've lost some weight. I had surgery a couple years ago and plateaued. Last Summer I was walking and was in pretty good shape and lost more weight. Pt reports he had a recent ablation and is now cleared to return to more activity. Pt states his back pain comes and goes. Pt notes he helped his son moved and could barely walk. Sometimes it would go down his leg. Pt states he sat for a long period on a bench and when he stood up his back just felt tight. Unable to put his finger on specific examples that worsen his back. Gets massages frequently from his girlfriend which does help. Was working out with a  trainer 3x/wk prior to his heart ablation (last seen on Thanksgiving) and was working on Emergency planning/management officer.   PERTINENT HISTORY:  s/p afib and flutter ablation HTN, atrial flutter, atrial fibrillation, weekly testosterone  injections  PAIN:  Are you having pain? Yes: NPRS scale: 3/10 currently; at worst 7 or 8  Pain location: Middle low back (maybe left worse) -- can radiate down occasionally R or L leg Pain description: tightness Aggravating factors: Not sure -- sometimes standing and walking Relieving factors: move around and stretch a little  PRECAUTIONS: None  RED FLAGS: None   WEIGHT BEARING RESTRICTIONS: No  FALLS:  Has patient fallen in last 6 months? 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  DIAGNOSTIC FINDINGS:  01/23/24 lumbar x-ray IMPRESSION: Mild degenerative joint changes at L5-S1.  PATIENT SURVEYS:  Modified Oswestry Low Back Pain Disability Questionnaire: 12 / 50 = 24.0 % (eval) Modified Oswestry Low Back Pain Disability Questionnaire: 7 / 50 = 14.0 % 04/02/24  COGNITION: Overall cognitive status: Within functional limits for tasks assessed     SENSATION: WFL  MUSCLE LENGTH: Hamstrings: Right ~60 deg; Left ~60 deg Thomas test: Right ~10 deg;  Left ~10 deg  POSTURE: Mild scoliosis with R iliac crest slightly higher than L  PALPATION: Mobile throughout thoracic and lumbar spine; tender to palpation and tight around lumbar paraspinals and QL  LUMBAR ROM:   AROM eval 04/02/24  Flexion 75% 90% painful  Extension 100% dull ache 100%  Right lateral flexion 1.5 above knee At knee  Left lateral flexion 1.5 above knee At knee  Right rotation 100% 100%  Left rotation  100% 100%   (Blank rows = not tested)  LOWER EXTREMITY ROM:     Active  Right eval Left eval  Hip flexion    Hip extension    Hip abduction    Hip adduction    Hip internal rotation    Hip external rotation    Knee flexion    Knee extension    Ankle dorsiflexion    Ankle plantarflexion    Ankle inversion    Ankle eversion     (Blank rows = not tested)  LOWER EXTREMITY MMT:    MMT Right eval Left eval  Hip flexion 4- 4-  Hip extension 4- 4-  Hip abduction 3+ 3+  Hip adduction    Hip internal rotation 5 5  Hip external rotation 4+ 4+  Knee flexion 4 4  Knee extension 5 5  Ankle dorsiflexion    Ankle plantarflexion    Ankle inversion    Ankle eversion     (Blank rows = not tested)  LUMBAR SPECIAL TESTS:  Straight leg raise test: Positive, FABER test: Negative, and Trendelenburg sign: Positive  FUNCTIONAL TESTS:  Double leg lowering test: ~50 deg Plank (forearm and knees) 2 min SLS: >1 min R & L, R more unstable than L  GAIT: Distance walked: Into clinic Assistive device utilized: None Level of assistance: Complete Independence Comments: Normal reciprocal pattern  TREATMENT DATE:  04/21/24 Elliptical Level 5 x 4 minutes Lats 35# Seated row 35# 20# AR press 15# straight arm pulls 20# overhead carry Passive LE stretches, HS, piriformis and ITB STM to the lumbar, piriformis and HS  04/14/24 Bike level 5 x 6 minutes 35# lats 2 x10 Hip abduction and extension 10# 2x10 20# overhead carry and farmer carry Passive stretch bilateral LE and trunk Feet on ball K2C, rotation, small bridges, isometric abs Static pelvic traction 75#  04/07/24 Bike level 5 x 6 minutes with 2 power bursts Outside walk and some light jog up hill Calf stretch 60# Leg press 70# resisted gait all directions 35# lats 20# farmer carry at side and overhewad Feet on ball K2c, rotation, bridge and isometric abs Passive LE stretches   04/02/24 Recumbent bike L3 x 6 min Supine hip  flexor stretch 2x 30 Supine piriformis stretch 2x30 Passive hamstring stretch contract/relax R&L Passive double knee to chest 2x30 Supine Feet on ball knees bent bridge, knees straight bridge, obliques twist knees straight Supine knees to chest and ext 3x10 Standing lumbar ext  03/13/24 Nustep level 5 x 6 minute Seated lumbar flexion green ball 2x30 Supine HS stretch w/ strap x 30 Supine piriformis stretch KTOS and figure 4 x 30 B Leg press 25lb 2x10  Pallof press blue TB 2x10 B Trunk rotation blue TB 2x10 B Foam roll to low back and glutes  03/07/24 Nustep level 5 x 6 minute Rows 25# 2x10 Lats 25# 2x10 LEg curls 25# 2x10 Green tband hip extension and abduction 2x10 Supine Feet on ball K2C, rotation, bridge, isometric abs Green tband clamshells Passive stretch LE's Updated  HEP see below  02/25/24 Seated hamstring stretch x 30 Supine hip flexor stretch 2x 30 Figure 4 stretch x 30 LTR 2x30 PPT 15x5 Sidelying clamshell red TB 2x10 Prone 1/2 on mat table hip extension 2x10 Supine hip extension/bridge off EOB 2x10   02/11/24 See HEP below                                                                                                                              Attempted bridging but pt feeling it in his back and not in glutes/hamstrings   PATIENT EDUCATION:  Education details: Exam findings, POC, initial HEP Person educated: Patient Education method: Explanation, Demonstration, and Handouts Education comprehension: verbalized understanding, returned demonstration, and needs further education  HOME EXERCISE PROGRAM: Access Code: [email or web address] URL: [email or web address] Date: 02/25/2024 Prepared by: Gellen April Marie Nonato  Exercises - Seated Hamstring Stretch  - 1 x daily - 7 x weekly - 2 sets - 30 sec hold - Supine Lower Trunk Rotation  - 1 x daily - 7 x weekly - 2 sets - 30 sec hold - Thomas Stretch on Table  - 1 x daily - 7 x weekly - 2 sets  - 30 sec hold - Supine Posterior Pelvic Tilt  - 1 x daily - 7 x weekly - 1 sets - 15 reps - 5 sec hold - Clamshell with Resistance  - 1 x daily - 7 x weekly - 3 sets - 10 reps - Prone Hip Extension on Table  - 1 x daily - 7 x weekly - 2-3 sets - 10 reps - Supine Hip Extension on Bench  - 1 x daily - 7 x weekly - 2-3 sets - 10 reps Access Code: [email or web address] URL: [email or web address] Date: 03/13/2024 Prepared by: Braylin Clark  Exercises - Standing Hip Extension with Anchored Resistance  - 1 x daily - 7 x weekly - 3 sets - 10 reps - 3 hold - Standing Hip Abduction with Anchored Resistance  - 1 x daily - 7 x weekly - 3 sets - 10 reps - 3 hold - Supine Hamstring Stretch with Doorway  - 1 x daily - 7 x weekly - 3 sets - 10 reps - 30 hold - Seated Hamstring Stretch with Chair  - 1 x daily - 7 x weekly - 3 sets - 10 reps - 30 hold - Supine Piriformis Stretch Pulling Heel to Hip  - 1 x daily - 7 x weekly - 3 sets - 10 reps - 20 hold - Gastroc Stretch on Wall  - 1 x daily - 7 x weekly - 3 sets - 10 reps - 30 hold - Supine Double Knee to Chest  - 1 x daily - 7 x weekly - 3 sets - 10 reps - 10 hold - Supine Lower Trunk Rotation  - 1 x daily - 7 x weekly - 3 sets - 10 reps - 10 hold - Supine Piriformis Stretch with Foot on  Ground  - 2 x daily - 7 x weekly - 2 sets - 2 reps - 30 sec hold - Supine Figure 4 Piriformis Stretch  - 2 x daily - 7 x weekly - 2 sets - 2 reps - 30 sec hold - Supine Hamstring Stretch with Strap  - 2 x daily - 7 x weekly - 2 sets - 2 reps - 30 sec hold ASSESSMENT:  CLINICAL IMPRESSION: Patient ran a 5K this past weekend, he reports that after the last time with me he was very sore but thought it could have been from doing the stretchzone the day before seeing me.  He reports sore in the HS and low back after the race. I focused on light stretches and then used the T-gun to help with soreness.  pt will benefit from continued PT sessions to fully realize his goals.    From eval: Patient is a 53 y.o. M who was seen today for physical therapy evaluation and treatment for intermittent low back. Assessment is significant for hamstring and hip flexor tightness with weak posterior LE chain (mostly glutes) with limited lumbar ROM and increased muscle tightness in lumbar paraspinals. Trialed hip strengthening exercises this session; able to feel good muscle contraction for hip abd but pt reports not feeling enough glute or hamstring activation with bridge or prone hip extension -- may need modifications next session. Pt will benefit from PT to address these issues to maximize his function to meet goals such as running.   OBJECTIVE IMPAIRMENTS: decreased activity tolerance, decreased coordination, decreased endurance, decreased mobility, decreased ROM, decreased strength, increased fascial restrictions, increased muscle spasms, impaired flexibility, improper body mechanics, and pain.     GOALS: Goals reviewed with patient? Yes  SHORT TERM GOALS: Target date: 03/03/2024   Pt will be ind with initial HEP Baseline: Goal status: MET- 03/13/24  2.  Pt will demo improved hamstring length with a SLR of at least 70 deg Baseline: ~60 deg 04/02/24: ~80 deg Goal status: MET  3.  Pt will demo improved hip flexor length with ability to obtain neutral hip ext during Debby Testing Baseline: 10 deg from neutral 04/02/24: able to touch mat table with Debby testing Goal status: MET   LONG TERM GOALS: NEW Target date: 05/14/2024    Pt will be ind with management and progression of HEP Baseline:  Goal status: IN PROGRESS 04/02/24 - pt is not consistent  2.  Pt will be able to deadlift at least 25# with good body mechanics for increased glute and hamstring strength Baseline:  Goal status: IN PROGRESS- LBP with hip hinge motion  3.  Pt will report improved overall pain by >/=50% (intensity, frequency and duration) Baseline:  04/02/24: 10% improvement Goal status: IN PROGRESS  04/21/24  4.  Pt will have improved Modified Oswestry score to </=12% to demo MCID Baseline: Modified Oswestry Low Back Pain Disability Questionnaire: 12 / 50 = 24.0 % 04/02/24: Modified Oswestry Low Back Pain Disability Questionnaire: 7 / 50 = 14.0 % Goal status: IN PROGRESS 04/21/24  5.  Pt will demo full and pain free lumbar ROM for improved tightness Baseline:  04/02/24: see ROM chart above Goal status: IN PROGRESS 04/21/24  6. Pt will be able to jog for at least 10 min without exacerbation of pain for pt's personal goals to start running Baseline:  Goal status: PARTIALLY MET 04/14/24 - able to do 5k but with some pain and soreness   PLAN:  PT FREQUENCY: 1-2x/week  PT  DURATION: 6 weeks  PLANNED INTERVENTIONS: 97164- PT Re-evaluation, 97750- Physical Performance Testing, 97110-Therapeutic exercises, 97530- Therapeutic activity, 97112- Neuromuscular re-education, [phone]- Self Care, 02859- Manual therapy, [phone]- Gait training, [phone]- Aquatic Therapy, [phone]- Electrical stimulation (unattended), [phone]- Ionotophoresis 4mg /ml Dexamethasone , Patient/Family education, Balance training, Stair training, Taping, Dry Needling, Joint mobilization, Spinal mobilization, Cryotherapy, and Moist heat.  PLAN FOR NEXT SESSION:  See how the STM did   Green Quincy W, PT 04/21/2024, 8:02 AM

## 2024-04-24 ENCOUNTER — Other Ambulatory Visit (HOSPITAL_BASED_OUTPATIENT_CLINIC_OR_DEPARTMENT_OTHER): Payer: Self-pay

## 2024-04-28 ENCOUNTER — Encounter: Admitting: Physical Therapy

## 2024-05-01 ENCOUNTER — Encounter: Payer: Self-pay | Admitting: Physical Therapy

## 2024-05-01 ENCOUNTER — Ambulatory Visit: Attending: Family Medicine | Admitting: Physical Therapy

## 2024-05-01 DIAGNOSIS — M6281 Muscle weakness (generalized): Secondary | ICD-10-CM | POA: Insufficient documentation

## 2024-05-01 DIAGNOSIS — R2689 Other abnormalities of gait and mobility: Secondary | ICD-10-CM | POA: Insufficient documentation

## 2024-05-01 DIAGNOSIS — M5459 Other low back pain: Secondary | ICD-10-CM | POA: Diagnosis not present

## 2024-05-01 NOTE — Therapy (Signed)
 OUTPATIENT PHYSICAL THERAPY TREATMENT    Patient Name: Gilbert [name] MRN: [id number] DOB:1971-02-01, 53 y.o., male Today's Date: 05/01/2024  END OF SESSION:  PT End of Session - 05/01/24 1747     Visit Number 9    Number of Visits 11    Date for PT Re-Evaluation 05/14/24    Authorization Type BCBS    PT Start Time 1745    PT Stop Time 1830    PT Time Calculation (min) 45 min    Activity Tolerance Patient tolerated treatment well    Behavior During Therapy [facility] for tasks assessed/performed             Past Medical History:  Diagnosis Date   ACHILLES TENDINITIS 12/28/2008   Qualifier: Diagnosis of  By: [name], [name]     Actinic keratosis 03/14/2022   Acute appendicitis 07/31/2018   Acute frontal sinusitis 09/01/2008   Qualifier: Diagnosis of  By: Antonio DOJamee     Alcohol use 09/20/2018   Allergic rhinitis 09/16/2014   Anal inflammation 03/08/2018   Asthma 09/13/2013   Backache 01/17/2007   Qualifier: Diagnosis of   By: [name], [name]      IMO SNOMED Dx Update Oct 2024     Basal cell carcinoma (BCC) of left shoulder 03/14/2022   Basal cell carcinoma of face 03/14/2022   Basal cell carcinoma of right popliteal region 03/14/2022   CHEST PAIN 06/29/2009   Qualifier: Diagnosis of  By: [name], [facility], ANTERIOR 09/22/2010   Qualifier: Diagnosis of  By: [name], [name]     Chronic pain of right ankle 09/20/2018   Decreased libido 12/14/2022   occasional ED.     Dyspnea on exertion 03/16/2022   Dysthymia 11/21/2018   Ectopic atrial rhythm 03/16/2022   Elevated blood sugar 09/20/2018   Elevated liver enzymes 07/19/2020   Essential hypertension 01/17/2007   Qualifier: Diagnosis of  By: [name], [name]     External hemorrhoids 03/31/2008   Qualifier: Diagnosis of  By: [name], [name]    GASTROENTERITIS 06/29/2009   Qualifier: Diagnosis of  By: [name], [name] 11/21/2011   GERD  06/29/2009   Qualifier: Diagnosis of  By: [name], [facility]     Healthcare maintenance 09/10/2017   Hemangioma of skin and subcutaneous tissue 03/14/2022   Hepatitis    unknown type many yrs ago - no residual problems   History of malignant neoplasm of skin 03/14/2022   Insomnia    Lapband APL + [facility] repair July 2013 04/24/2012   Left arm numbness 02/05/2014   Leg wound, left 09/16/2014   Lentigo 03/14/2022   Low testosterone  09/10/2017   Lump in neck 04/22/2014   Medically noncompliant 07/16/2020   Melanocytic nevi of left upper limb, including shoulder 03/14/2022   Melanocytic nevi of trunk 03/14/2022   Mild sleep apnea 03/04/2024   02/08/24 Home sleep study ([facility]): AHI 5   Milia 03/14/2022   Morbid obesity (HCC)    Neoplasm of uncertain behavior of skin 03/14/2022   Night sweat 11/21/2018   OBESITY 03/31/2008   Qualifier: Diagnosis of  By: [name], [name]    Obesity (BMI 30.0-34.9) 03/16/2022   Ocular rosacea    Other seborrheic keratosis 03/14/2022   Other skin changes due to chronic exposure to nonionizing radiation 03/14/2022   Palpitations 03/16/2022   Post-nasal drip 07/16/2020   Pruritus  ani 11/21/2018   Pseudofolliculitis barbae 03/14/2022   Vasomotor rhinitis 11/21/2018   Viral warts 03/14/2022   Vitamin D  deficiency 09/23/2018   Past Surgical History:  Procedure Laterality Date   ANKLE ARTHROSCOPY WITH RECONSTRUCTION Right 09/26/2018   Procedure: ANKLE DEBRIDEMENT WITH POSSIBLE LATERAL LIGAMENT  RECONSTRUCTION;  Surgeon: [name], MD;  Location: [facility];  Service: Orthopedics;  Laterality: Right;   ATRIAL FIBRILLATION ABLATION N/A 01/04/2024   Procedure: ATRIAL FIBRILLATION ABLATION;  Surgeon: [name], MD;  Location: [facility] INVASIVE CV LAB;  Service: Cardiovascular;  Laterality: N/A;   HIATAL HERNIA REPAIR  04/23/2012   Procedure: HERNIA REPAIR HIATAL;  Surgeon: [name], MD;  Location: [facility] ORS;  Service: General;  Laterality:  N/A;   KNEE SURGERY Right    fracture knee cap   LAPAROSCOPIC APPENDECTOMY N/A 07/31/2018   Procedure: APPENDECTOMY LAPAROSCOPIC;  Surgeon: [name], MD;  Location: [facility] ORS;  Service: General;  Laterality: N/A;   LAPAROSCOPIC GASTRIC BANDING  04/23/2012   Procedure: LAPAROSCOPIC GASTRIC BANDING;  Surgeon: [name], MD;  Location: [facility] ORS;  Service: General;  Laterality: N/A;   LASIK     Patient Active Problem List   Diagnosis Date Noted   Mild sleep apnea 03/04/2024   PAF (paroxysmal atrial fibrillation) (HCC) 09/04/2023   Decreased libido 12/14/2022   Ectopic atrial rhythm 03/16/2022   Obesity (BMI 30.0-34.9) 03/16/2022   Dyspnea on exertion 03/16/2022   Palpitations 03/16/2022   Hepatitis 03/14/2022   Morbid obesity (HCC) 03/14/2022   Ocular rosacea 03/14/2022   Basal cell carcinoma (BCC) of left shoulder 03/14/2022   Basal cell carcinoma of face 03/14/2022   Basal cell carcinoma of right popliteal region 03/14/2022   Hemangioma of skin and subcutaneous tissue 03/14/2022   History of malignant neoplasm of skin 03/14/2022   Lentigo 03/14/2022   Viral warts 03/14/2022   Melanocytic nevi of left upper limb, including shoulder 03/14/2022   Melanocytic nevi of trunk 03/14/2022   Milia 03/14/2022   Neoplasm of uncertain behavior of skin 03/14/2022   Actinic keratosis 03/14/2022   Other seborrheic keratosis 03/14/2022   Other skin changes due to chronic exposure to nonionizing radiation 03/14/2022   Pseudofolliculitis barbae 03/14/2022   Elevated liver enzymes 07/19/2020   Medically noncompliant 07/16/2020   Post-nasal drip 07/16/2020   Pruritus ani 11/21/2018   Dysthymia 11/21/2018   Vasomotor rhinitis 11/21/2018   Night sweat 11/21/2018   Vitamin D  deficiency 09/23/2018   Chronic pain of right ankle 09/20/2018   Elevated blood sugar 09/20/2018   Alcohol use 09/20/2018   Acute appendicitis 07/31/2018   Anal inflammation 03/08/2018   Healthcare maintenance  09/10/2017   Low testosterone  09/10/2017   Leg wound, left 09/16/2014   Allergic rhinitis 09/16/2014   Lump in neck 04/22/2014   Left arm numbness 02/05/2014   Asthma 09/13/2013   Lapband APL + HH repair July 2013 04/24/2012   General medical examination 11/21/2011   Insomnia 05/03/2011   CHEST WALL PAIN, ANTERIOR 09/22/2010   GERD 06/29/2009   GASTROENTERITIS 06/29/2009   CHEST PAIN 06/29/2009   ACHILLES TENDINITIS 12/28/2008   Acute frontal sinusitis 09/01/2008   OBESITY 03/31/2008   External hemorrhoids 03/31/2008   Essential hypertension 01/17/2007   Backache 01/17/2007    PCP: Almarie Waddell KATHEE, NP  REFERRING PROVIDER: Almarie Waddell KATHEE, NP  REFERRING DIAG: [phone] (ICD-10-CM) - Chronic bilateral low back pain with left-sided sciatica  Rationale for Evaluation and Treatment: Rehabilitation  THERAPY DIAG:  Other  low back pain  Other abnormalities of gait and mobility  Muscle weakness (generalized)  ONSET DATE: At least 1 year  SUBJECTIVE:                                                                                                                                                                                           SUBJECTIVE STATEMENT: I have not had significant pain in a while, I went on vacation and did well with all of the walking, after flying home I really had some pain in the left buttock .  I still don't have much core strength  From eval: .SABRASABRARecently I've lost some weight. I had surgery a couple years ago and plateaued. Last Summer I was walking and was in pretty good shape and lost more weight. Pt reports he had a recent ablation and is now cleared to return to more activity. Pt states his back pain comes and goes. Pt notes he helped his son moved and could barely walk. Sometimes it would go down his leg. Pt states he sat for a long period on a bench and when he stood up his back just felt tight. Unable to put his finger on specific examples that  worsen his back. Gets massages frequently from his girlfriend which does help. Was working out with a trainer 3x/wk prior to his heart ablation (last seen on Thanksgiving) and was working on Emergency planning/management officer.   PERTINENT HISTORY:  s/p afib and flutter ablation HTN, atrial flutter, atrial fibrillation, weekly testosterone  injections  PAIN:  Are you having pain? Yes: NPRS scale: 3/10 currently; at worst 7 or 8  Pain location: Middle low back (maybe left worse) -- can radiate down occasionally R or L leg Pain description: tightness Aggravating factors: Not sure -- sometimes standing and walking Relieving factors: move around and stretch a little  PRECAUTIONS: None  RED FLAGS: None   WEIGHT BEARING RESTRICTIONS: No  FALLS:  Has patient fallen in last 6 months? 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  DIAGNOSTIC FINDINGS:  01/23/24 lumbar x-ray IMPRESSION: Mild degenerative joint changes at L5-S1.  PATIENT SURVEYS:  Modified Oswestry Low Back Pain Disability Questionnaire: 12 / 50 = 24.0 % (eval) Modified Oswestry Low Back Pain Disability Questionnaire: 7 / 50 = 14.0 % 04/02/24  COGNITION: Overall cognitive status: Within functional limits for tasks assessed  SENSATION: WFL  MUSCLE LENGTH: Hamstrings: Right ~60 deg; Left ~60 deg Thomas test: Right ~10 deg; Left ~10 deg  POSTURE: Mild scoliosis with R iliac crest slightly higher than L  PALPATION: Mobile throughout thoracic and lumbar spine; tender to palpation and tight around lumbar paraspinals and QL  LUMBAR ROM:   AROM eval 04/02/24  Flexion 75% 90% painful  Extension 100% dull ache 100%  Right lateral flexion 1.5 above  knee At knee  Left lateral flexion 1.5 above knee At knee  Right rotation 100% 100%  Left rotation 100% 100%   (Blank rows = not tested)  LOWER EXTREMITY ROM:     Active  Right eval Left eval  Hip flexion    Hip extension    Hip abduction    Hip adduction    Hip internal rotation    Hip external rotation    Knee flexion    Knee extension    Ankle dorsiflexion    Ankle plantarflexion    Ankle inversion    Ankle eversion     (Blank rows = not tested)  LOWER EXTREMITY MMT:    MMT Right eval Left eval  Hip flexion 4- 4-  Hip extension 4- 4-  Hip abduction 3+ 3+  Hip adduction    Hip internal rotation 5 5  Hip external rotation 4+ 4+  Knee flexion 4 4  Knee extension 5 5  Ankle dorsiflexion    Ankle plantarflexion    Ankle inversion    Ankle eversion     (Blank rows = not tested)  LUMBAR SPECIAL TESTS:  Straight leg raise test: Positive, FABER test: Negative, and Trendelenburg sign: Positive  FUNCTIONAL TESTS:  Double leg lowering test: ~50 deg Plank (forearm and knees) 2 min SLS: >1 min R & L, R more unstable than L  GAIT: Distance walked: Into clinic Assistive device utilized: None Level of assistance: Complete Independence Comments: Normal reciprocal pattern  TREATMENT DATE:  05/01/24 Elliptical level 5 x 5 minutes 15# straight arm pulls 45# lats 25# AR press 35# chops from top to bottom  to the right had some back pain Plank 20 sec, modified plank with elbow digs 15# hip SLR and then high knees Supine dead bug and with ball trying to get core activation, has to lift head to get the core to fire Passive stretch HS, piriformis and ITB Tactile cues were needed throughout the session to get abs to activate  04/21/24 Elliptical Level 5 x 4 minutes Lats 35# Seated row 35# 20# AR press 15# straight arm pulls 20# overhead carry Passive LE stretches, HS, piriformis and ITB STM to the lumbar, piriformis and HS  04/14/24 Bike level 5 x 6 minutes 35#  lats 2 x10 Hip abduction and extension 10# 2x10 20# overhead carry and farmer carry Passive stretch bilateral LE and trunk Feet on ball K2C, rotation, small bridges, isometric abs Static pelvic traction 75#  04/07/24 Bike level 5 x 6 minutes with 2 power bursts Outside walk and some light jog up hill Calf stretch 60# Leg press 70# resisted gait all directions 35# lats 20# farmer carry at side and overhewad Feet on ball K2c, rotation, bridge and isometric abs Passive LE stretches   04/02/24 Recumbent bike L3 x 6 min Supine hip flexor stretch 2x 30 Supine piriformis stretch 2x30 Passive hamstring stretch contract/relax R&L Passive double knee to chest 2x30 Supine Feet on ball knees bent bridge, knees straight bridge, obliques twist knees straight Supine knees to chest  and ext 3x10 Standing lumbar ext  03/13/24 Nustep level 5 x 6 minute Seated lumbar flexion green ball 2x30 Supine HS stretch w/ strap x 30 Supine piriformis stretch KTOS and figure 4 x 30 B Leg press 25lb 2x10  Pallof press blue TB 2x10 B Trunk rotation blue TB 2x10 B Foam roll to low back and glutes  03/07/24 Nustep level 5 x 6 minute Rows 25# 2x10 Lats 25# 2x10 LEg curls 25# 2x10 Green tband hip extension and abduction 2x10 Supine Feet on ball K2C, rotation, bridge, isometric abs Green tband clamshells Passive stretch LE's Updated HEP see below  PATIENT EDUCATION:  Education details: Exam findings, POC, initial HEP Person educated: Patient Education method: Explanation, Demonstration, and Handouts Education comprehension: verbalized understanding, returned demonstration, and needs further education  HOME EXERCISE PROGRAM: Access Code: [email or web address] URL: [email or web address] Date: 02/25/2024 Prepared by: Gellen April Marie Nonato  Exercises - Seated Hamstring Stretch  - 1 x daily - 7 x weekly - 2 sets - 30 sec hold - Supine Lower Trunk Rotation  - 1 x daily - 7 x weekly - 2 sets  - 30 sec hold - Thomas Stretch on Table  - 1 x daily - 7 x weekly - 2 sets - 30 sec hold - Supine Posterior Pelvic Tilt  - 1 x daily - 7 x weekly - 1 sets - 15 reps - 5 sec hold - Clamshell with Resistance  - 1 x daily - 7 x weekly - 3 sets - 10 reps - Prone Hip Extension on Table  - 1 x daily - 7 x weekly - 2-3 sets - 10 reps - Supine Hip Extension on Bench  - 1 x daily - 7 x weekly - 2-3 sets - 10 reps Access Code: [email or web address] URL: [email or web address] Date: 03/13/2024 Prepared by: Braylin Clark  Exercises - Standing Hip Extension with Anchored Resistance  - 1 x daily - 7 x weekly - 3 sets - 10 reps - 3 hold - Standing Hip Abduction with Anchored Resistance  - 1 x daily - 7 x weekly - 3 sets - 10 reps - 3 hold - Supine Hamstring Stretch with Doorway  - 1 x daily - 7 x weekly - 3 sets - 10 reps - 30 hold - Seated Hamstring Stretch with Chair  - 1 x daily - 7 x weekly - 3 sets - 10 reps - 30 hold - Supine Piriformis Stretch Pulling Heel to Hip  - 1 x daily - 7 x weekly - 3 sets - 10 reps - 20 hold - Gastroc Stretch on Wall  - 1 x daily - 7 x weekly - 3 sets - 10 reps - 30 hold - Supine Double Knee to Chest  - 1 x daily - 7 x weekly - 3 sets - 10 reps - 10 hold - Supine Lower Trunk Rotation  - 1 x daily - 7 x weekly - 3 sets - 10 reps - 10 hold - Supine Piriformis Stretch with Foot on Ground  - 2 x daily - 7 x weekly - 2 sets - 2 reps - 30 sec hold - Supine Figure 4 Piriformis Stretch  - 2 x daily - 7 x weekly - 2 sets - 2 reps - 30 sec hold - Supine Hamstring Stretch with Strap  - 2 x daily - 7 x weekly - 2 sets - 2 reps - 30 sec hold ASSESSMENT:  CLINICAL IMPRESSION: Patient just got back from vacation, reports that he did  very well with minimal pain started having pain in the back and the glute last night on the plane back.  He reports that he feels that he has a very weak core, and we did a lot of core exercises and I could feel good activation but he continue to say he did  not feel it.  He had some left hip pain with the hip exercises.  He is very tight in the left ITB, calf and piriformis mms.  pt will benefit from continued PT sessions to fully realize his goals.   From eval: Patient is a 53 y.o. M who was seen today for physical therapy evaluation and treatment for intermittent low back. Assessment is significant for hamstring and hip flexor tightness with weak posterior LE chain (mostly glutes) with limited lumbar ROM and increased muscle tightness in lumbar paraspinals. Trialed hip strengthening exercises this session; able to feel good muscle contraction for hip abd but pt reports not feeling enough glute or hamstring activation with bridge or prone hip extension -- may need modifications next session. Pt will benefit from PT to address these issues to maximize his function to meet goals such as running.   OBJECTIVE IMPAIRMENTS: decreased activity tolerance, decreased coordination, decreased endurance, decreased mobility, decreased ROM, decreased strength, increased fascial restrictions, increased muscle spasms, impaired flexibility, improper body mechanics, and pain.     GOALS: Goals reviewed with patient? Yes  SHORT TERM GOALS: Target date: 03/03/2024   Pt will be ind with initial HEP Baseline: Goal status: MET- 03/13/24  2.  Pt will demo improved hamstring length with a SLR of at least 70 deg Baseline: ~60 deg 04/02/24: ~80 deg Goal status: MET  3.  Pt will demo improved hip flexor length with ability to obtain neutral hip ext during Debby Testing Baseline: 10 deg from neutral 04/02/24: able to touch mat table with Debby testing Goal status: MET   LONG TERM GOALS: NEW Target date: 05/14/2024    Pt will be ind with management and progression of HEP Baseline:  Goal status: IN PROGRESS 04/02/24 - pt is not consistent  2.  Pt will be able to deadlift at least 25# with good body mechanics for increased glute and hamstring strength Baseline:  Goal  status: IN PROGRESS- LBP with hip hinge motion  3.  Pt will report improved overall pain by >/=50% (intensity, frequency and duration) Baseline:  04/02/24: 10% improvement Goal status: IN PROGRESS 04/21/24  4.  Pt will have improved Modified Oswestry score to </=12% to demo MCID Baseline: Modified Oswestry Low Back Pain Disability Questionnaire: 12 / 50 = 24.0 % 04/02/24: Modified Oswestry Low Back Pain Disability Questionnaire: 7 / 50 = 14.0 % Goal status: IN PROGRESS 05/01/24  5.  Pt will demo full and pain free lumbar ROM for improved tightness Baseline:  04/02/24: see ROM chart above Goal status: IN PROGRESS 04/21/24  6. Pt will be able to jog for at least 10 min without exacerbation of pain for pt's personal goals to start running Baseline:  Goal status: met 05/01/24   PLAN:  PT FREQUENCY: 1-2x/week  PT DURATION: 6 weeks  PLANNED INTERVENTIONS: 97164- PT Re-evaluation, 97750- Physical Performance Testing, 97110-Therapeutic exercises, 97530- Therapeutic activity, 97112- Neuromuscular re-education, 97535- Self Care, [id number]- Manual therapy, [id number]- Gait training, [phone]- Aquatic Therapy, [phone]- Electrical stimulation (unattended), [phone]- Ionotophoresis 4mg /ml Dexamethasone , Patient/Family education, Balance training, Stair training, Taping, Dry Needling, Joint mobilization, Spinal mobilization, Cryotherapy, and Moist heat.  PLAN FOR NEXT SESSION:  try to activate  core mms, flexibility   OBADIAH OZELL ORN, PT 05/01/2024, 5:49 PM

## 2024-05-05 ENCOUNTER — Other Ambulatory Visit (HOSPITAL_BASED_OUTPATIENT_CLINIC_OR_DEPARTMENT_OTHER): Payer: Self-pay

## 2024-05-05 ENCOUNTER — Other Ambulatory Visit: Payer: Self-pay

## 2024-05-05 DIAGNOSIS — G4733 Obstructive sleep apnea (adult) (pediatric): Secondary | ICD-10-CM | POA: Diagnosis not present

## 2024-05-06 ENCOUNTER — Other Ambulatory Visit (HOSPITAL_BASED_OUTPATIENT_CLINIC_OR_DEPARTMENT_OTHER): Payer: Self-pay

## 2024-05-06 DIAGNOSIS — L570 Actinic keratosis: Secondary | ICD-10-CM | POA: Diagnosis not present

## 2024-05-06 DIAGNOSIS — L814 Other melanin hyperpigmentation: Secondary | ICD-10-CM | POA: Diagnosis not present

## 2024-05-06 DIAGNOSIS — L821 Other seborrheic keratosis: Secondary | ICD-10-CM | POA: Diagnosis not present

## 2024-05-06 DIAGNOSIS — D225 Melanocytic nevi of trunk: Secondary | ICD-10-CM | POA: Diagnosis not present

## 2024-05-06 DIAGNOSIS — D2262 Melanocytic nevi of left upper limb, including shoulder: Secondary | ICD-10-CM | POA: Diagnosis not present

## 2024-05-07 ENCOUNTER — Ambulatory Visit

## 2024-05-07 ENCOUNTER — Other Ambulatory Visit: Payer: Self-pay

## 2024-05-07 DIAGNOSIS — R2689 Other abnormalities of gait and mobility: Secondary | ICD-10-CM | POA: Diagnosis not present

## 2024-05-07 DIAGNOSIS — M5459 Other low back pain: Secondary | ICD-10-CM | POA: Diagnosis not present

## 2024-05-07 DIAGNOSIS — M6281 Muscle weakness (generalized): Secondary | ICD-10-CM | POA: Diagnosis not present

## 2024-05-07 NOTE — Therapy (Signed)
 OUTPATIENT PHYSICAL THERAPY TREATMENT    Patient Name: Gilbert [name] MRN: [id number] DOB:1971-04-12, 53 y.o., male Today's Date: 05/07/2024  END OF SESSION:  PT End of Session - 05/07/24 1003     PT Start Time 0803    PT Stop Time 0850    PT Time Calculation (min) 47 min    Activity Tolerance Patient tolerated treatment well    Behavior During Therapy [facility] for tasks assessed/performed              Past Medical History:  Diagnosis Date   ACHILLES TENDINITIS 12/28/2008   Qualifier: Diagnosis of  By: [name], [name]     Actinic keratosis 03/14/2022   Acute appendicitis 07/31/2018   Acute frontal sinusitis 09/01/2008   Qualifier: Diagnosis of  By: [name]     Alcohol use 09/20/2018   Allergic rhinitis 09/16/2014   Anal inflammation 03/08/2018   Asthma 09/13/2013   Backache 01/17/2007   Qualifier: Diagnosis of   By: [name], [name]      IMO SNOMED Dx Update Oct 2024     Basal cell carcinoma (BCC) of left shoulder 03/14/2022   Basal cell carcinoma of face 03/14/2022   Basal cell carcinoma of right popliteal region 03/14/2022   CHEST PAIN 06/29/2009   Qualifier: Diagnosis of  By: [name], [name], ANTERIOR 09/22/2010   Qualifier: Diagnosis of  By: [name], [name]     Chronic pain of right ankle 09/20/2018   Decreased libido 12/14/2022   occasional ED.     Dyspnea on exertion 03/16/2022   Dysthymia 11/21/2018   Ectopic atrial rhythm 03/16/2022   Elevated blood sugar 09/20/2018   Elevated liver enzymes 07/19/2020   Essential hypertension 01/17/2007   Qualifier: Diagnosis of  By: [name], [name]     External hemorrhoids 03/31/2008   Qualifier: Diagnosis of  By: [name], [name]    GASTROENTERITIS 06/29/2009   Qualifier: Diagnosis of  By: [name], [name] 11/21/2011   GERD 06/29/2009   Qualifier: Diagnosis of  By: [name], [facility]     Healthcare maintenance 09/10/2017    Hemangioma of skin and subcutaneous tissue 03/14/2022   Hepatitis    unknown type many yrs ago - no residual problems   History of malignant neoplasm of skin 03/14/2022   Insomnia    Lapband APL + [facility] repair July 2013 04/24/2012   Left arm numbness 02/05/2014   Leg wound, left 09/16/2014   Lentigo 03/14/2022   Low testosterone  09/10/2017   Lump in neck 04/22/2014   Medically noncompliant 07/16/2020   Melanocytic nevi of left upper limb, including shoulder 03/14/2022   Melanocytic nevi of trunk 03/14/2022   Mild sleep apnea 03/04/2024   02/08/24 Home sleep study ([facility]): AHI 5   Milia 03/14/2022   Morbid obesity (HCC)    Neoplasm of uncertain behavior of skin 03/14/2022   Night sweat 11/21/2018   OBESITY 03/31/2008   Qualifier: Diagnosis of  By: [name], [name]    Obesity (BMI 30.0-34.9) 03/16/2022   Ocular rosacea    Other seborrheic keratosis 03/14/2022   Other skin changes due to chronic exposure to nonionizing radiation 03/14/2022   Palpitations 03/16/2022   Post-nasal drip 07/16/2020   Pruritus ani 11/21/2018   Pseudofolliculitis barbae 03/14/2022   Vasomotor rhinitis 11/21/2018   Viral warts 03/14/2022   Vitamin D  deficiency 09/23/2018   Past  Surgical History:  Procedure Laterality Date   ANKLE ARTHROSCOPY WITH RECONSTRUCTION Right 09/26/2018   Procedure: ANKLE DEBRIDEMENT WITH POSSIBLE LATERAL LIGAMENT  RECONSTRUCTION;  Surgeon: [name], MD;  Location: [facility] OR;  Service: Orthopedics;  Laterality: Right;   ATRIAL FIBRILLATION ABLATION N/A 01/04/2024   Procedure: ATRIAL FIBRILLATION ABLATION;  Surgeon: [name], MD;  Location: [facility] INVASIVE CV LAB;  Service: Cardiovascular;  Laterality: N/A;   HIATAL HERNIA REPAIR  04/23/2012   Procedure: HERNIA REPAIR HIATAL;  Surgeon: [name], MD;  Location: [facility] ORS;  Service: General;  Laterality: N/A;   KNEE SURGERY Right    fracture knee cap   LAPAROSCOPIC APPENDECTOMY N/A 07/31/2018   Procedure:  APPENDECTOMY LAPAROSCOPIC;  Surgeon: [name], MD;  Location: [facility] ORS;  Service: General;  Laterality: N/A;   LAPAROSCOPIC GASTRIC BANDING  04/23/2012   Procedure: LAPAROSCOPIC GASTRIC BANDING;  Surgeon: [name], MD;  Location: [facility] ORS;  Service: General;  Laterality: N/A;   LASIK     Patient Active Problem List   Diagnosis Date Noted   Mild sleep apnea 03/04/2024   PAF (paroxysmal atrial fibrillation) (HCC) 09/04/2023   Decreased libido 12/14/2022   Ectopic atrial rhythm 03/16/2022   Obesity (BMI 30.0-34.9) 03/16/2022   Dyspnea on exertion 03/16/2022   Palpitations 03/16/2022   Hepatitis 03/14/2022   Morbid obesity (HCC) 03/14/2022   Ocular rosacea 03/14/2022   Basal cell carcinoma (BCC) of left shoulder 03/14/2022   Basal cell carcinoma of face 03/14/2022   Basal cell carcinoma of right popliteal region 03/14/2022   Hemangioma of skin and subcutaneous tissue 03/14/2022   History of malignant neoplasm of skin 03/14/2022   Lentigo 03/14/2022   Viral warts 03/14/2022   Melanocytic nevi of left upper limb, including shoulder 03/14/2022   Melanocytic nevi of trunk 03/14/2022   Milia 03/14/2022   Neoplasm of uncertain behavior of skin 03/14/2022   Actinic keratosis 03/14/2022   Other seborrheic keratosis 03/14/2022   Other skin changes due to chronic exposure to nonionizing radiation 03/14/2022   Pseudofolliculitis barbae 03/14/2022   Elevated liver enzymes 07/19/2020   Medically noncompliant 07/16/2020   Post-nasal drip 07/16/2020   Pruritus ani 11/21/2018   Dysthymia 11/21/2018   Vasomotor rhinitis 11/21/2018   Night sweat 11/21/2018   Vitamin D  deficiency 09/23/2018   Chronic pain of right ankle 09/20/2018   Elevated blood sugar 09/20/2018   Alcohol use 09/20/2018   Acute appendicitis 07/31/2018   Anal inflammation 03/08/2018   Healthcare maintenance 09/10/2017   Low testosterone  09/10/2017   Leg wound, left 09/16/2014   Allergic rhinitis 09/16/2014   Lump  in neck 04/22/2014   Left arm numbness 02/05/2014   Asthma 09/13/2013   Lapband APL + [facility] repair July 2013 04/24/2012   General medical examination 11/21/2011   Insomnia 05/03/2011   CHEST WALL PAIN, ANTERIOR 09/22/2010   GERD 06/29/2009   GASTROENTERITIS 06/29/2009   CHEST PAIN 06/29/2009   ACHILLES TENDINITIS 12/28/2008   Acute frontal sinusitis 09/01/2008   OBESITY 03/31/2008   External hemorrhoids 03/31/2008   Essential hypertension 01/17/2007   Backache 01/17/2007    PCP: Almarie Waddell KATHEE, NP  REFERRING PROVIDER: Almarie Waddell KATHEE, NP  REFERRING DIAG: [phone] (ICD-10-CM) - Chronic bilateral low back pain with left-sided sciatica  Rationale for Evaluation and Treatment: Rehabilitation  THERAPY DIAG:  Other low back pain  Other abnormalities of gait and mobility  Muscle weakness (generalized)  ONSET DATE: At least 1 year  SUBJECTIVE:  SUBJECTIVE STATEMENT: I am having a little pain currently, not terrible.  Not consistent pattern, running about 5 k 6 x week.  From eval: .SABRASABRARecently I've lost some weight. I had surgery a couple years ago and plateaued. Last Summer I was walking and was in pretty good shape and lost more weight. Pt reports he had a recent ablation and is now cleared to return to more activity. Pt states his back pain comes and goes. Pt notes he helped his son moved and could barely walk. Sometimes it would go down his leg. Pt states he sat for a long period on a bench and when he stood up his back just felt tight. Unable to put his finger on specific examples that worsen his back. Gets massages frequently from his girlfriend which does help. Was working out with a trainer 3x/wk prior to his heart ablation (last seen on Thanksgiving) and was working on Emergency planning/management officer.    PERTINENT HISTORY:  s/p afib and flutter ablation HTN, atrial flutter, atrial fibrillation, weekly testosterone  injections  PAIN:  Are you having pain? Yes: NPRS scale: 3/10 currently; at worst 7 or 8  Pain location: Middle low back (maybe left worse) -- can radiate down occasionally R or L leg Pain description: tightness Aggravating factors: Not sure -- sometimes standing and walking Relieving factors: move around and stretch a little  PRECAUTIONS: None  RED FLAGS: None   WEIGHT BEARING RESTRICTIONS: No  FALLS:  Has patient fallen in last 6 months? 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  DIAGNOSTIC FINDINGS:  01/23/24 lumbar x-ray IMPRESSION: Mild degenerative joint changes at L5-S1.  PATIENT SURVEYS:  Modified Oswestry Low Back Pain Disability Questionnaire: 12 / 50 = 24.0 % (eval) Modified Oswestry Low Back Pain Disability Questionnaire: 7 / 50 = 14.0 % 04/02/24  COGNITION: Overall cognitive status: Within functional limits for tasks assessed     SENSATION: WFL  MUSCLE LENGTH: Hamstrings: Right ~60 deg; Left ~60 deg Thomas test: Right ~10 deg; Left ~10 deg  POSTURE: Mild scoliosis with R iliac crest slightly higher than L  PALPATION: Mobile throughout thoracic and lumbar spine; tender to palpation and tight around lumbar paraspinals and QL  LUMBAR ROM:   AROM eval 04/02/24  Flexion 75% 90% painful  Extension 100% dull ache 100%  Right lateral flexion 1.5 above knee At knee  Left lateral flexion 1.5 above knee At knee  Right rotation 100% 100%  Left rotation 100% 100%   (Blank rows = not tested)  LOWER EXTREMITY ROM:     Active  Right eval Left eval  Hip flexion     Hip extension    Hip abduction    Hip adduction    Hip internal rotation    Hip external rotation    Knee flexion    Knee extension    Ankle dorsiflexion    Ankle plantarflexion    Ankle inversion    Ankle eversion     (Blank rows = not tested)  LOWER EXTREMITY MMT:    MMT Right eval Left eval  Hip flexion 4- 4-  Hip extension 4- 4-  Hip abduction 3+ 3+  Hip adduction    Hip internal rotation 5 5  Hip external rotation 4+ 4+  Knee flexion 4 4  Knee extension 5 5  Ankle dorsiflexion    Ankle plantarflexion    Ankle inversion    Ankle eversion     (Blank rows = not tested)  LUMBAR SPECIAL TESTS:  Straight leg raise test: Positive, FABER test: Negative, and Trendelenburg sign: Positive  FUNCTIONAL TESTS:  Double leg lowering test: ~50 deg Plank (forearm and knees) 2 min SLS: >1 min R & L, R more unstable than L  GAIT: Distance walked: Into clinic Assistive device utilized: None Level of assistance: Complete Independence Comments: Normal reciprocal pattern  TREATMENT DATE:  05/07/24:  Neuromuscular re education: instructed in various exercises primarily to engage spinal stabilizers, achieve better motor control Elliptical x 5 min Reassessed hip abd strength B and 5/5 today Leg press 120# 3 x 15 cues, demonstration in positioning hips with enough flexion to engage gluteals Inst in standing dead lifts with 20# kettle bell, 20 reps Attempted side lying mob with movement to achieve rotational stretch lower lumbar spine, with no change in lumbar tightness per pt Instructed in cat/cow, primarily to achieve deep core engagement Attempted side planks with pain shoulder, unable to sustain  Attempted prayer stretch, but knee pain B Plank from knees with shoulders extended, able to maintain with good control for 1 min Inst in supine B hamstring, piriformis stretch, with yoga strap   05/01/24 Elliptical level 5 x 5 minutes 15# straight arm pulls 45# lats 25# AR  press 35# chops from top to bottom  to the right had some back pain Plank 20 sec, modified plank with elbow digs 15# hip SLR and then high knees Supine dead bug and with ball trying to get core activation, has to lift head to get the core to fire Passive stretch HS, piriformis and ITB Tactile cues were needed throughout the session to get abs to activate  04/21/24 Elliptical Level 5 x 4 minutes Lats 35# Seated row 35# 20# AR press 15# straight arm pulls 20# overhead carry Passive LE stretches, HS, piriformis and ITB STM to the lumbar, piriformis and HS  04/14/24 Bike level 5 x 6 minutes 35# lats 2 x10 Hip abduction and extension 10# 2x10 20# overhead carry and farmer carry Passive stretch bilateral LE and trunk Feet on ball K2C, rotation, small bridges, isometric abs Static pelvic traction 75#  04/07/24 Bike level 5 x 6 minutes with 2 power bursts Outside walk and some light jog up hill Calf stretch 60# Leg press 70# resisted gait all directions 35# lats 20# farmer carry at side and overhewad Feet on ball K2c, rotation, bridge and isometric abs Passive LE stretches   04/02/24 Recumbent bike L3 x 6 min Supine hip flexor stretch 2x 30 Supine piriformis stretch 2x30 Passive hamstring stretch contract/relax R&L Passive double knee to chest 2x30 Supine Feet on ball knees bent bridge, knees straight bridge, obliques twist knees straight Supine knees to chest and ext 3x10 Standing lumbar ext  03/13/24 Nustep level 5 x 6 minute Seated lumbar flexion green ball 2x30 Supine HS stretch w/ strap x 30 Supine piriformis stretch KTOS and figure 4 x 30 B Leg press 25lb 2x10  Pallof press blue TB 2x10 B Trunk rotation blue TB 2x10 B Foam roll to low back and glutes  03/07/24 Nustep level 5 x 6 minute Rows 25# 2x10 Lats 25# 2x10 LEg curls 25# 2x10 Green tband hip extension and abduction 2x10 Supine Feet on ball K2C, rotation, bridge, isometric abs  Green tband  Publishing rights manager LE's Updated HEP see below  PATIENT EDUCATION:  Education details: Exam findings, POC, initial HEP Person educated: Patient Education method: Explanation, Demonstration, and Handouts Education comprehension: verbalized understanding, returned demonstration, and needs further education  HOME EXERCISE PROGRAM: Access Code: [email or web address] URL: [email or web address] Date: 05/07/2024 Prepared by: Greig Shunsuke Granzow  Exercises - Seated Hamstring Stretch  - 1 x daily - 7 x weekly - 2 sets - 30 sec hold - Supine Lower Trunk Rotation  - 1 x daily - 7 x weekly - 2 sets - 30 sec hold - Thomas Stretch on Table  - 1 x daily - 7 x weekly - 2 sets - 30 sec hold - Supine Posterior Pelvic Tilt  - 1 x daily - 7 x weekly - 1 sets - 15 reps - 5 sec hold - Clamshell with Resistance  - 1 x daily - 7 x weekly - 3 sets - 10 reps - Prone Hip Extension on Table  - 1 x daily - 7 x weekly - 2-3 sets - 10 reps - Supine Hip Extension on Bench  - 1 x daily - 7 x weekly - 2-3 sets - 10 reps - Cat/Cow Weightbearing (Hemiplegia)  - 1 x daily - 7 x weekly - 3 sets - 10 reps - Full Plank on Knees  - 1 x daily - 7 x weekly - 3 sets - 10 reps - Deadlift With Dumbbells  - 1 x daily - 7 x weekly - 3 sets - 10 reps Access Code: [email or web address] URL: [email or web address] Date: 02/25/2024 Prepared by: Gellen April Marie Nonato  Exercises - Seated Hamstring Stretch  - 1 x daily - 7 x weekly - 2 sets - 30 sec hold - Supine Lower Trunk Rotation  - 1 x daily - 7 x weekly - 2 sets - 30 sec hold - Thomas Stretch on Table  - 1 x daily - 7 x weekly - 2 sets - 30 sec hold - Supine Posterior Pelvic Tilt  - 1 x daily - 7 x weekly - 1 sets - 15 reps - 5 sec hold - Clamshell with Resistance  - 1 x daily - 7 x weekly - 3 sets - 10 reps - Prone Hip Extension on Table  - 1 x daily - 7 x weekly - 2-3 sets - 10 reps - Supine Hip Extension on Bench  - 1 x daily - 7 x weekly - 2-3 sets - 10 reps Access  Code: [email or web address] URL: [email or web address] Date: 03/13/2024 Prepared by: Braylin Clark  Exercises - Standing Hip Extension with Anchored Resistance  - 1 x daily - 7 x weekly - 3 sets - 10 reps - 3 hold - Standing Hip Abduction with Anchored Resistance  - 1 x daily - 7 x weekly - 3 sets - 10 reps - 3 hold - Supine Hamstring Stretch with Doorway  - 1 x daily - 7 x weekly - 3 sets - 10 reps - 30 hold - Seated Hamstring Stretch with Chair  - 1 x daily - 7 x weekly - 3 sets - 10 reps - 30 hold - Supine Piriformis Stretch Pulling Heel to Hip  - 1 x daily - 7 x weekly - 3 sets - 10 reps - 20 hold - Gastroc Stretch on Wall  - 1 x daily - 7 x weekly - 3 sets - 10 reps - 30 hold - Supine Double Knee to Chest  - 1 x daily - 7 x  weekly - 3 sets - 10 reps - 10 hold - Supine Lower Trunk Rotation  - 1 x daily - 7 x weekly - 3 sets - 10 reps - 10 hold - Supine Piriformis Stretch with Foot on Ground  - 2 x daily - 7 x weekly - 2 sets - 2 reps - 30 sec hold - Supine Figure 4 Piriformis Stretch  - 2 x daily - 7 x weekly - 2 sets - 2 reps - 30 sec hold - Supine Hamstring Stretch with Strap  - 2 x daily - 7 x weekly - 2 sets - 2 reps - 30 sec hold ASSESSMENT:  CLINICAL IMPRESSION:   Pt returned today with ongoing lower back tightness, relieves by bending forward and hanging.  We continued with various ex and stretches to engage his core, gluts, and stretch posterior chain.SABRA  Also pt does have postural positioning in standing with post pelvic tilt, with gluteal atrophy, added resistance training with the isotonic equipment and with kettle bell.  Advised him/ educated him that he would benefit from bulk strengthening B gluts to absorb the shock/ impact through his hips and spine with running, 2 x week.  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  He did achieve engagement of his abdominal musculature/core with the cat cow, advised him to utilize to assist with muscle memory prior to more intensive core  engagement/ training.  pt will benefit from continued PT sessions to fully realize his goals.   From eval: Patient is a 53 y.o. M who was seen today for physical therapy evaluation and treatment for intermittent low back. Assessment is significant for hamstring and hip flexor tightness with weak posterior LE chain (mostly glutes) with limited lumbar ROM and increased muscle tightness in lumbar paraspinals. Trialed hip strengthening exercises this session; able to feel good muscle contraction for hip abd but pt reports not feeling enough glute or hamstring activation with bridge or prone hip extension -- may need modifications next session. Pt will benefit from PT to address these issues to maximize his function to meet goals such as running.   OBJECTIVE IMPAIRMENTS: decreased activity tolerance, decreased coordination, decreased endurance, decreased mobility, decreased ROM, decreased strength, increased fascial restrictions, increased muscle spasms, impaired flexibility, improper body mechanics, and pain.     GOALS: Goals reviewed with patient? Yes  SHORT TERM GOALS: Target date: 03/03/2024   Pt will be ind with initial HEP Baseline: Goal status: MET- 03/13/24  2.  Pt will demo improved hamstring length with a SLR of at least 70 deg Baseline: ~60 deg 04/02/24: ~80 deg Goal status: MET  3.  Pt will demo improved hip flexor length with ability to obtain neutral hip ext during Debby Testing Baseline: 10 deg from neutral 04/02/24: able to touch mat table with Debby testing Goal status: MET   LONG TERM GOALS: NEW Target date: 05/14/2024    Pt will be ind with management and progression of HEP Baseline:  Goal status: IN PROGRESS 04/02/24 - pt is not consistent  2.  Pt will be able to deadlift at least 25# with good body mechanics for increased glute and hamstring strength Baseline:  Goal status: IN PROGRESS- LBP with hip hinge motion 06/04/24:Able to dead lift 20# but some L gluteal  pain  3.  Pt will report improved overall pain by >/=50% (intensity, frequency and duration) Baseline:  04/02/24: 10% improvement Goal status: IN PROGRESS 04/21/24  4.  Pt will have improved Modified Oswestry score to </=  12% to demo MCID Baseline: Modified Oswestry Low Back Pain Disability Questionnaire: 12 / 50 = 24.0 % 04/02/24: Modified Oswestry Low Back Pain Disability Questionnaire: 7 / 50 = 14.0 % Goal status: IN PROGRESS 05/01/24  5.  Pt will demo full and pain free lumbar ROM for improved tightness Baseline:  04/02/24: see ROM chart above Goal status: IN PROGRESS 04/21/24  6. Pt will be able to jog for at least 10 min without exacerbation of pain for pt's personal goals to start running Baseline:  Goal status: met 05/01/24   PLAN:  PT FREQUENCY: 1-2x/week  PT DURATION: 6 weeks  PLANNED INTERVENTIONS: 97164- PT Re-evaluation, 97750- Physical Performance Testing, 97110-Therapeutic exercises, 97530- Therapeutic activity, 97112- Neuromuscular re-education, 97535- Self Care, [id number]- Manual therapy, [id number]- Gait training, [phone]- Aquatic Therapy, [phone]- Electrical stimulation (unattended), [phone]- Ionotophoresis 4mg /ml Dexamethasone , Patient/Family education, Balance training, Stair training, Taping, Dry Needling, Joint mobilization, Spinal mobilization, Cryotherapy, and Moist heat.  PLAN FOR NEXT SESSION:  try to activate core mms, flexibility   Tamula Morrical L Aaban Griep, PT, DPT, OCS 05/07/2024, 10:05 AM

## 2024-05-13 ENCOUNTER — Encounter: Payer: Self-pay | Admitting: Physical Therapy

## 2024-05-13 ENCOUNTER — Ambulatory Visit: Admitting: Physical Therapy

## 2024-05-13 DIAGNOSIS — R2689 Other abnormalities of gait and mobility: Secondary | ICD-10-CM

## 2024-05-13 DIAGNOSIS — M6281 Muscle weakness (generalized): Secondary | ICD-10-CM | POA: Diagnosis not present

## 2024-05-13 DIAGNOSIS — M5459 Other low back pain: Secondary | ICD-10-CM | POA: Diagnosis not present

## 2024-05-13 NOTE — Therapy (Signed)
 OUTPATIENT PHYSICAL THERAPY TREATMENT    Patient Name: Gilbert [name] MRN: [id number] DOB:Nov 12, 1970, 53 y.o., male Today's Date: 05/13/2024  END OF SESSION:  PT End of Session - 05/13/24 1744     Visit Number 11    Number of Visits 11    Date for PT Re-Evaluation 05/14/24    Authorization Type BCBS    PT Start Time 1745    PT Stop Time 1830    PT Time Calculation (min) 45 min    Activity Tolerance Patient tolerated treatment well    Behavior During Therapy [facility] for tasks assessed/performed              Past Medical History:  Diagnosis Date   ACHILLES TENDINITIS 12/28/2008   Qualifier: Diagnosis of  By: [name], [name]     Actinic keratosis 03/14/2022   Acute appendicitis 07/31/2018   Acute frontal sinusitis 09/01/2008   Qualifier: Diagnosis of  By: Antonio DOJamee     Alcohol use 09/20/2018   Allergic rhinitis 09/16/2014   Anal inflammation 03/08/2018   Asthma 09/13/2013   Backache 01/17/2007   Qualifier: Diagnosis of   By: [name], [name]      IMO SNOMED Dx Update Oct 2024     Basal cell carcinoma (BCC) of left shoulder 03/14/2022   Basal cell carcinoma of face 03/14/2022   Basal cell carcinoma of right popliteal region 03/14/2022   CHEST PAIN 06/29/2009   Qualifier: Diagnosis of  By: [name], [facility] WALL PAIN, ANTERIOR 09/22/2010   Qualifier: Diagnosis of  By: [name], [name]     Chronic pain of right ankle 09/20/2018   Decreased libido 12/14/2022   occasional ED.     Dyspnea on exertion 03/16/2022   Dysthymia 11/21/2018   Ectopic atrial rhythm 03/16/2022   Elevated blood sugar 09/20/2018   Elevated liver enzymes 07/19/2020   Essential hypertension 01/17/2007   Qualifier: Diagnosis of  By: [name], [name]     External hemorrhoids 03/31/2008   Qualifier: Diagnosis of  By: [name], [name]    GASTROENTERITIS 06/29/2009   Qualifier: Diagnosis of  By: [name], [name] 11/21/2011    GERD 06/29/2009   Qualifier: Diagnosis of  By: [name], [facility]     Healthcare maintenance 09/10/2017   Hemangioma of skin and subcutaneous tissue 03/14/2022   Hepatitis    unknown type many yrs ago - no residual problems   History of malignant neoplasm of skin 03/14/2022   Insomnia    Lapband APL + [facility] repair July 2013 04/24/2012   Left arm numbness 02/05/2014   Leg wound, left 09/16/2014   Lentigo 03/14/2022   Low testosterone  09/10/2017   Lump in neck 04/22/2014   Medically noncompliant 07/16/2020   Melanocytic nevi of left upper limb, including shoulder 03/14/2022   Melanocytic nevi of trunk 03/14/2022   Mild sleep apnea 03/04/2024   02/08/24 Home sleep study ([facility]): AHI 5   Milia 03/14/2022   Morbid obesity (HCC)    Neoplasm of uncertain behavior of skin 03/14/2022   Night sweat 11/21/2018   OBESITY 03/31/2008   Qualifier: Diagnosis of  By: [name], [name]    Obesity (BMI 30.0-34.9) 03/16/2022   Ocular rosacea    Other seborrheic keratosis 03/14/2022   Other skin changes due to chronic exposure to nonionizing radiation 03/14/2022   Palpitations 03/16/2022   Post-nasal drip 07/16/2020  Pruritus ani 11/21/2018   Pseudofolliculitis barbae 03/14/2022   Vasomotor rhinitis 11/21/2018   Viral warts 03/14/2022   Vitamin D  deficiency 09/23/2018   Past Surgical History:  Procedure Laterality Date   ANKLE ARTHROSCOPY WITH RECONSTRUCTION Right 09/26/2018   Procedure: ANKLE DEBRIDEMENT WITH POSSIBLE LATERAL LIGAMENT  RECONSTRUCTION;  Surgeon: [name], MD;  Location: [facility] OR;  Service: Orthopedics;  Laterality: Right;   ATRIAL FIBRILLATION ABLATION N/A 01/04/2024   Procedure: ATRIAL FIBRILLATION ABLATION;  Surgeon: [name], MD;  Location: [facility] INVASIVE CV LAB;  Service: Cardiovascular;  Laterality: N/A;   HIATAL HERNIA REPAIR  04/23/2012   Procedure: HERNIA REPAIR HIATAL;  Surgeon: [name], MD;  Location: [facility] ORS;  Service: General;   Laterality: N/A;   KNEE SURGERY Right    fracture knee cap   LAPAROSCOPIC APPENDECTOMY N/A 07/31/2018   Procedure: APPENDECTOMY LAPAROSCOPIC;  Surgeon: [name], MD;  Location: [facility] ORS;  Service: General;  Laterality: N/A;   LAPAROSCOPIC GASTRIC BANDING  04/23/2012   Procedure: LAPAROSCOPIC GASTRIC BANDING;  Surgeon: [name], MD;  Location: [facility] ORS;  Service: General;  Laterality: N/A;   LASIK     Patient Active Problem List   Diagnosis Date Noted   Mild sleep apnea 03/04/2024   PAF (paroxysmal atrial fibrillation) (HCC) 09/04/2023   Decreased libido 12/14/2022   Ectopic atrial rhythm 03/16/2022   Obesity (BMI 30.0-34.9) 03/16/2022   Dyspnea on exertion 03/16/2022   Palpitations 03/16/2022   Hepatitis 03/14/2022   Morbid obesity (HCC) 03/14/2022   Ocular rosacea 03/14/2022   Basal cell carcinoma (BCC) of left shoulder 03/14/2022   Basal cell carcinoma of face 03/14/2022   Basal cell carcinoma of right popliteal region 03/14/2022   Hemangioma of skin and subcutaneous tissue 03/14/2022   History of malignant neoplasm of skin 03/14/2022   Lentigo 03/14/2022   Viral warts 03/14/2022   Melanocytic nevi of left upper limb, including shoulder 03/14/2022   Melanocytic nevi of trunk 03/14/2022   Milia 03/14/2022   Neoplasm of uncertain behavior of skin 03/14/2022   Actinic keratosis 03/14/2022   Other seborrheic keratosis 03/14/2022   Other skin changes due to chronic exposure to nonionizing radiation 03/14/2022   Pseudofolliculitis barbae 03/14/2022   Elevated liver enzymes 07/19/2020   Medically noncompliant 07/16/2020   Post-nasal drip 07/16/2020   Pruritus ani 11/21/2018   Dysthymia 11/21/2018   Vasomotor rhinitis 11/21/2018   Night sweat 11/21/2018   Vitamin D  deficiency 09/23/2018   Chronic pain of right ankle 09/20/2018   Elevated blood sugar 09/20/2018   Alcohol use 09/20/2018   Acute appendicitis 07/31/2018   Anal inflammation 03/08/2018   Healthcare  maintenance 09/10/2017   Low testosterone  09/10/2017   Leg wound, left 09/16/2014   Allergic rhinitis 09/16/2014   Lump in neck 04/22/2014   Left arm numbness 02/05/2014   Asthma 09/13/2013   Lapband APL + HH repair July 2013 04/24/2012   General medical examination 11/21/2011   Insomnia 05/03/2011   CHEST WALL PAIN, ANTERIOR 09/22/2010   GERD 06/29/2009   GASTROENTERITIS 06/29/2009   CHEST PAIN 06/29/2009   ACHILLES TENDINITIS 12/28/2008   Acute frontal sinusitis 09/01/2008   OBESITY 03/31/2008   External hemorrhoids 03/31/2008   Essential hypertension 01/17/2007   Backache 01/17/2007    PCP: Almarie Waddell KATHEE, NP  REFERRING PROVIDER: Almarie Waddell KATHEE, NP  REFERRING DIAG: [phone] (ICD-10-CM) - Chronic bilateral low back pain with left-sided sciatica  Rationale for Evaluation and Treatment: Rehabilitation  THERAPY DIAG:  Other low back pain  Other abnormalities of gait and mobility  Muscle weakness (generalized)  ONSET DATE: At least 1 year  SUBJECTIVE:                                                                                                                                                                                           SUBJECTIVE STATEMENT: I have been doing okay I helped my son move into college, and then played pickleball yesterday, I am very sore in the low back.    From eval: .SABRASABRARecently I've lost some weight. I had surgery a couple years ago and plateaued. Last Summer I was walking and was in pretty good shape and lost more weight. Pt reports he had a recent ablation and is now cleared to return to more activity. Pt states his back pain comes and goes. Pt notes he helped his son moved and could barely walk. Sometimes it would go down his leg. Pt states he sat for a long period on a bench and when he stood up his back just felt tight. Unable to put his finger on specific examples that worsen his back. Gets massages frequently from his girlfriend  which does help. Was working out with a trainer 3x/wk prior to his heart ablation (last seen on Thanksgiving) and was working on Emergency planning/management officer.   PERTINENT HISTORY:  s/p afib and flutter ablation HTN, atrial flutter, atrial fibrillation, weekly testosterone  injections  PAIN:  Are you having pain? Yes: NPRS scale: 3/10 currently; at worst 7 or 8  Pain location: Middle low back (maybe left worse) -- can radiate down occasionally R or L leg Pain description: tightness Aggravating factors: Not sure -- sometimes standing and walking Relieving factors: move around and stretch a little  PRECAUTIONS: None  RED FLAGS: None   WEIGHT BEARING RESTRICTIONS: No  FALLS:  Has patient fallen in last 6 months? 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  DIAGNOSTIC FINDINGS:  01/23/24 lumbar x-ray IMPRESSION: Mild degenerative joint changes at L5-S1.  PATIENT SURVEYS:  Modified Oswestry Low Back Pain Disability Questionnaire: 12 / 50 = 24.0 % (eval) Modified Oswestry Low Back Pain Disability Questionnaire: 7 / 50 = 14.0 % 04/02/24  COGNITION: Overall cognitive status: Within functional limits for tasks assessed     SENSATION: WFL  MUSCLE LENGTH: Hamstrings: Right ~60 deg; Left ~  60 deg Thomas test: Right ~10 deg; Left ~10 deg  POSTURE: Mild scoliosis with R iliac crest slightly higher than L  PALPATION: Mobile throughout thoracic and lumbar spine; tender to palpation and tight around lumbar paraspinals and QL  LUMBAR ROM:   AROM eval 04/02/24  Flexion 75% 90% painful  Extension 100% dull ache 100%  Right lateral flexion 1.5 above knee At knee  Left lateral flexion 1.5 above knee At knee   Right rotation 100% 100%  Left rotation 100% 100%   (Blank rows = not tested)  LOWER EXTREMITY ROM:     Active  Right eval Left eval  Hip flexion    Hip extension    Hip abduction    Hip adduction    Hip internal rotation    Hip external rotation    Knee flexion    Knee extension    Ankle dorsiflexion    Ankle plantarflexion    Ankle inversion    Ankle eversion     (Blank rows = not tested)  LOWER EXTREMITY MMT:    MMT Right eval Left eval  Hip flexion 4- 4-  Hip extension 4- 4-  Hip abduction 3+ 3+  Hip adduction    Hip internal rotation 5 5  Hip external rotation 4+ 4+  Knee flexion 4 4  Knee extension 5 5  Ankle dorsiflexion    Ankle plantarflexion    Ankle inversion    Ankle eversion     (Blank rows = not tested)  LUMBAR SPECIAL TESTS:  Straight leg raise test: Positive, FABER test: Negative, and Trendelenburg sign: Positive  FUNCTIONAL TESTS:  Double leg lowering test: ~50 deg Plank (forearm and knees) 2 min SLS: >1 min R & L, R more unstable than L  GAIT: Distance walked: Into clinic Assistive device utilized: None Level of assistance: Complete Independence Comments: Normal reciprocal pattern  TREATMENT DATE:  05/13/24 Nustep level 6 x 5 minutes Seated rows 35#  Lats 35# 25# rotation bilaterally Leg press 80# 2x10 6.6# ball carry at arms length in front Passive stretch LE's Instruction in self care for gym, flexibility and body mechanics  05/07/24:  Neuromuscular re education: instructed in various exercises primarily to engage spinal stabilizers, achieve better motor control Elliptical x 5 min Reassessed hip abd strength B and 5/5 today Leg press 120# 3 x 15 cues, demonstration in positioning hips with enough flexion to engage gluteals Inst in standing dead lifts with 20# kettle bell, 20 reps Attempted side lying mob with movement to achieve rotational stretch lower lumbar spine, with no change in lumbar tightness per pt Instructed in  cat/cow, primarily to achieve deep core engagement Attempted side planks with pain shoulder, unable to sustain  Attempted prayer stretch, but knee pain B Plank from knees with shoulders extended, able to maintain with good control for 1 min Inst in supine B hamstring, piriformis stretch, with yoga strap   05/01/24 Elliptical level 5 x 5 minutes 15# straight arm pulls 45# lats 25# AR press 35# chops from top to bottom  to the right had some back pain Plank 20 sec, modified plank with elbow digs 15# hip SLR and then high knees Supine dead bug and with ball trying to get core activation, has to lift head to get the core to fire Passive stretch HS, piriformis and ITB Tactile cues were needed throughout the session to get abs to activate  04/21/24 Elliptical Level 5 x 4 minutes Lats 35# Seated row 35# 20# AR  press 15# straight arm pulls 20# overhead carry Passive LE stretches, HS, piriformis and ITB STM to the lumbar, piriformis and HS  04/14/24 Bike level 5 x 6 minutes 35# lats 2 x10 Hip abduction and extension 10# 2x10 20# overhead carry and farmer carry Passive stretch bilateral LE and trunk Feet on ball K2C, rotation, small bridges, isometric abs Static pelvic traction 75#  04/07/24 Bike level 5 x 6 minutes with 2 power bursts Outside walk and some light jog up hill Calf stretch 60# Leg press 70# resisted gait all directions 35# lats 20# farmer carry at side and overhewad Feet on ball K2c, rotation, bridge and isometric abs Passive LE stretches   04/02/24 Recumbent bike L3 x 6 min Supine hip flexor stretch 2x 30 Supine piriformis stretch 2x30 Passive hamstring stretch contract/relax R&L Passive double knee to chest 2x30 Supine Feet on ball knees bent bridge, knees straight bridge, obliques twist knees straight Supine knees to chest and ext 3x10 Standing lumbar ext  03/13/24 Nustep level 5 x 6 minute Seated lumbar flexion green ball 2x30 Supine HS stretch w/  strap x 30 Supine piriformis stretch KTOS and figure 4 x 30 B Leg press 25lb 2x10  Pallof press blue TB 2x10 B Trunk rotation blue TB 2x10 B Foam roll to low back and glutes  03/07/24 Nustep level 5 x 6 minute Rows 25# 2x10 Lats 25# 2x10 LEg curls 25# 2x10 Green tband hip extension and abduction 2x10 Supine Feet on ball K2C, rotation, bridge, isometric abs Green tband clamshells Passive stretch LE's Updated HEP see below  PATIENT EDUCATION:  Education details: Exam findings, POC, initial HEP Person educated: Patient Education method: Explanation, Demonstration, and Handouts Education comprehension: verbalized understanding, returned demonstration, and needs further education  HOME EXERCISE PROGRAM: Access Code: [email or web address] URL: [email or web address] Date: 05/07/2024 Prepared by: Greig Speaks  Exercises - Seated Hamstring Stretch  - 1 x daily - 7 x weekly - 2 sets - 30 sec hold - Supine Lower Trunk Rotation  - 1 x daily - 7 x weekly - 2 sets - 30 sec hold - Thomas Stretch on Table  - 1 x daily - 7 x weekly - 2 sets - 30 sec hold - Supine Posterior Pelvic Tilt  - 1 x daily - 7 x weekly - 1 sets - 15 reps - 5 sec hold - Clamshell with Resistance  - 1 x daily - 7 x weekly - 3 sets - 10 reps - Prone Hip Extension on Table  - 1 x daily - 7 x weekly - 2-3 sets - 10 reps - Supine Hip Extension on Bench  - 1 x daily - 7 x weekly - 2-3 sets - 10 reps - Cat/Cow Weightbearing (Hemiplegia)  - 1 x daily - 7 x weekly - 3 sets - 10 reps - Full Plank on Knees  - 1 x daily - 7 x weekly - 3 sets - 10 reps - Deadlift With Dumbbells  - 1 x daily - 7 x weekly - 3 sets - 10 reps Access Code: [email or web address] URL: [email or web address] Date: 02/25/2024 Prepared by: Gellen April Marie Nonato  Exercises - Seated Hamstring Stretch  - 1 x daily - 7 x weekly - 2 sets - 30 sec hold - Supine Lower Trunk Rotation  - 1 x daily - 7 x weekly - 2 sets - 30 sec hold - Thomas Stretch on  Table  - 1 x daily - 7 x weekly - 2 sets - 30 sec hold -  Supine Posterior Pelvic Tilt  - 1 x daily - 7 x weekly - 1 sets - 15 reps - 5 sec hold - Clamshell with Resistance  - 1 x daily - 7 x weekly - 3 sets - 10 reps - Prone Hip Extension on Table  - 1 x daily - 7 x weekly - 2-3 sets - 10 reps - Supine Hip Extension on Bench  - 1 x daily - 7 x weekly - 2-3 sets - 10 reps Access Code: [email or web address] URL: [email or web address] Date: 03/13/2024 Prepared by: Sol Gaskins  Exercises - Standing Hip Extension with Anchored Resistance  - 1 x daily - 7 x weekly - 3 sets - 10 reps - 3 hold - Standing Hip Abduction with Anchored Resistance  - 1 x daily - 7 x weekly - 3 sets - 10 reps - 3 hold - Supine Hamstring Stretch with Doorway  - 1 x daily - 7 x weekly - 3 sets - 10 reps - 30 hold - Seated Hamstring Stretch with Chair  - 1 x daily - 7 x weekly - 3 sets - 10 reps - 30 hold - Supine Piriformis Stretch Pulling Heel to Hip  - 1 x daily - 7 x weekly - 3 sets - 10 reps - 20 hold - Gastroc Stretch on Wall  - 1 x daily - 7 x weekly - 3 sets - 10 reps - 30 hold - Supine Double Knee to Chest  - 1 x daily - 7 x weekly - 3 sets - 10 reps - 10 hold - Supine Lower Trunk Rotation  - 1 x daily - 7 x weekly - 3 sets - 10 reps - 10 hold - Supine Piriformis Stretch with Foot on Ground  - 2 x daily - 7 x weekly - 2 sets - 2 reps - 30 sec hold - Supine Figure 4 Piriformis Stretch  - 2 x daily - 7 x weekly - 2 sets - 2 reps - 30 sec hold - Supine Hamstring Stretch with Strap  - 2 x daily - 7 x weekly - 2 sets - 2 reps - 30 sec hold ASSESSMENT:  CLINICAL IMPRESSION: Spoke with patient regarding progress, he is doing well but seems to have hit a plateau, he is still hurting in the low back but from different activities, his insurance authorization runs out today, we decided to not ask for more visits but have him try on his own, recommended that he see MD if worse or if the same in 2 months  From eval: Patient  is a 53 y.o. M who was seen today for physical therapy evaluation and treatment for intermittent low back. Assessment is significant for hamstring and hip flexor tightness with weak posterior LE chain (mostly glutes) with limited lumbar ROM and increased muscle tightness in lumbar paraspinals. Trialed hip strengthening exercises this session; able to feel good muscle contraction for hip abd but pt reports not feeling enough glute or hamstring activation with bridge or prone hip extension -- may need modifications next session. Pt will benefit from PT to address these issues to maximize his function to meet goals such as running.   OBJECTIVE IMPAIRMENTS: decreased activity tolerance, decreased coordination, decreased endurance, decreased mobility, decreased ROM, decreased strength, increased fascial restrictions, increased muscle spasms, impaired flexibility, improper body mechanics, and pain.     GOALS: Goals reviewed with patient? Yes  SHORT TERM GOALS: Target date: 03/03/2024   Pt will be ind with initial HEP Baseline: Goal  status: MET- 03/13/24  2.  Pt will demo improved hamstring length with a SLR of at least 70 deg Baseline: ~60 deg 04/02/24: ~80 deg Goal status: MET  3.  Pt will demo improved hip flexor length with ability to obtain neutral hip ext during Debby Testing Baseline: 10 deg from neutral 04/02/24: able to touch mat table with Debby testing Goal status: MET   LONG TERM GOALS: NEW Target date: 05/14/2024    Pt will be ind with management and progression of HEP Baseline:  Goal status: not met 2024/05/20  2.  Pt will be able to deadlift at least 25# with good body mechanics for increased glute and hamstring strength Baseline:  Goal status: IN PROGRESS- LBP with hip hinge motion 14-May-2024:Able to dead lift 20# but some L gluteal pain  3.  Pt will report improved overall pain by >/=50% (intensity, frequency and duration) Baseline:  04/02/24: 10% improvement Goal status: not  m,et May 20, 2024  4.  Pt will have improved Modified Oswestry score to </=12% to demo MCID Baseline: Modified Oswestry Low Back Pain Disability Questionnaire: 12 / 50 = 24.0 % 04/02/24: Modified Oswestry Low Back Pain Disability Questionnaire: 7 / 50 = 14.0 % Goal status: IN PROGRESS 05/01/24  5.  Pt will demo full and pain free lumbar ROM for improved tightness Baseline:  04/02/24: see ROM chart above Goal status: not met 20-May-2024  6. Pt will be able to jog for at least 10 min without exacerbation of pain for pt's personal goals to start running Baseline:  Goal status: met 05/01/24   PLAN:  PT FREQUENCY: 1-2x/week  PT DURATION: 6 weeks  PLANNED INTERVENTIONS: 97164- PT Re-evaluation, 97750- Physical Performance Testing, 97110-Therapeutic exercises, 97530- Therapeutic activity, 97112- Neuromuscular re-education, 97535- Self Care, [id number]- Manual therapy, [id number]- Gait training, [phone]- Aquatic Therapy, [phone]- Electrical stimulation (unattended), [phone]- Ionotophoresis 4mg /ml Dexamethasone , Patient/Family education, Balance training, Stair training, Taping, Dry Needling, Joint mobilization, Spinal mobilization, Cryotherapy, and Moist heat.  PLAN FOR NEXT SESSION:  patient to try flexibility and strength training on his own, feels like we are at a plateau   OBADIAH OZELL ORN, PT,  2024/05/20, 5:45 PM

## 2024-05-19 ENCOUNTER — Other Ambulatory Visit: Payer: Self-pay

## 2024-05-19 ENCOUNTER — Ambulatory Visit: Admitting: Family Medicine

## 2024-05-19 ENCOUNTER — Encounter: Payer: Self-pay | Admitting: Family Medicine

## 2024-05-19 VITALS — BP 116/62 | HR 52 | Ht 74.0 in | Wt 211.0 lb

## 2024-05-19 DIAGNOSIS — M5442 Lumbago with sciatica, left side: Secondary | ICD-10-CM

## 2024-05-19 DIAGNOSIS — G8929 Other chronic pain: Secondary | ICD-10-CM | POA: Diagnosis not present

## 2024-05-19 NOTE — Progress Notes (Signed)
   Established Patient Office Visit  Subjective   Patient ID: Gilbert [name], male    DOB: 02/16/1971  Age: 53 y.o. MRN: [id number]  Chief Complaint  Patient presents with   Medical Management of Chronic Issues   Back Pain      Discussed the use of AI scribe software for clinical note transcription with the patient, who gave verbal consent to proceed.  History of Present Illness Gilbert [name] is a 53 year old male who presents with persistent lower back pain.  He experiences persistent lower back pain described as tightness, located at lower back. The pain worsens with activities such as moving, digging, and playing pickleball. Despite running and walking 30-40 miles a week, which he believes helps, the pain persists. He has been attending physical therapy, but insurance coverage has ended, leading to inconsistency with home exercises. He has started yoga to improve flexibility, joining a studio for an introductory month.  He experiences occasional numbness and tingling, primarily in the left leg. He has noticed a difference in collarbone height.   He is not currently on blood pressure medication, which he has been on for 25 years, and recent checks show normal blood pressure.              All review of systems negative except what is listed in the HPI    Objective:     BP 116/62   Pulse (!) 52   Ht 6' 2 (1.88 m)   Wt 211 lb (95.7 kg)   SpO2 100%   BMI 27.09 kg/m    Physical Exam Vitals reviewed.  Constitutional:      Appearance: Normal appearance.  Cardiovascular:     Rate and Rhythm: Normal rate and regular rhythm.  Pulmonary:     Effort: Pulmonary effort is normal.     Breath sounds: Normal breath sounds.  Musculoskeletal:        General: No swelling or tenderness. Normal range of motion.  Skin:    General: Skin is warm and dry.  Neurological:     Mental Status: He is alert and oriented to person, place, and time.  Psychiatric:        Mood and  Affect: Mood normal.        Behavior: Behavior normal.        Thought Content: Thought content normal.        Judgment: Judgment normal.      No results found for any visits on 05/19/24.    The 10-year ASCVD risk score (Arnett DK, et al., 2019) is: 3.4%* (Cholesterol units were assumed)    Assessment & Plan:   Problem List Items Addressed This Visit       Active Problems   Backache - Primary   Relevant Orders   Ambulatory referral to Orthopedic Surgery    Assessment & Plan Chronic low back pain with left-sided sciatica Chronic low back pain with intermittent left-sided sciatica, degenerative changes at L5-S1, persistent tightness and pain, occasional numbness and tingling in the left leg. Not interested in surgical intervention. - Refer to orthopedic specialist for further evaluation and potential MRI. - Continue exercise as tolerated  - Encourage continuation of stretching, strength training, and yoga.       Return if symptoms worsen or fail to improve, for / schedule CPE.    Waddell KATHEE Mon, NP

## 2024-05-20 ENCOUNTER — Encounter: Admitting: Physical Therapy

## 2024-06-01 ENCOUNTER — Other Ambulatory Visit (HOSPITAL_BASED_OUTPATIENT_CLINIC_OR_DEPARTMENT_OTHER): Payer: Self-pay

## 2024-06-02 ENCOUNTER — Other Ambulatory Visit: Payer: Self-pay

## 2024-06-05 DIAGNOSIS — G4733 Obstructive sleep apnea (adult) (pediatric): Secondary | ICD-10-CM | POA: Diagnosis not present

## 2024-06-09 ENCOUNTER — Encounter: Payer: Self-pay | Admitting: Physical Medicine and Rehabilitation

## 2024-06-09 ENCOUNTER — Ambulatory Visit (INDEPENDENT_AMBULATORY_CARE_PROVIDER_SITE_OTHER): Admitting: Physical Medicine and Rehabilitation

## 2024-06-09 DIAGNOSIS — G8929 Other chronic pain: Secondary | ICD-10-CM

## 2024-06-09 DIAGNOSIS — M5441 Lumbago with sciatica, right side: Secondary | ICD-10-CM

## 2024-06-09 DIAGNOSIS — M5416 Radiculopathy, lumbar region: Secondary | ICD-10-CM | POA: Diagnosis not present

## 2024-06-09 NOTE — Progress Notes (Unsigned)
 Pain Scale   Average Pain 2 Patient advising he has chronic lower back pain radiating bilaterally to legs standing increases pain and resting relieves some pain         +Driver, -BT, -Dye Allergies.

## 2024-06-09 NOTE — Progress Notes (Unsigned)
 Gilbert [name] - 53 y.o. male MRN [id number]  Date of birth: 05-06-1971  Office Visit Note: Visit Date: 06/09/2024 PCP: Almarie Waddell NOVAK, NP Referred by: Almarie Waddell NOVAK, NP  Subjective: Chief Complaint  Patient presents with   Lower Back - Pain   HPI: Gilbert [name] is a 53 y.o. male who comes in today per the request of Waddell Almarie, NP for evaluation of chronic, worsening and severe bilateral lower back pain radiating to right buttock down posterior thigh to knee. Pain ongoing for several years, worsens with activity and lifting. He describes pain as sore and aching sensation, currently rates as 5 out of 10. Feels that he can't stand up straight due to pain. Some relief of pain with home exercise regimen, rest and use of medications. He is avid runner, planning for 5K this weekend. He recently finished formal physical therapy, minimal relief of pain with these treatments. Recent lumbar radiographs show mild degenerative joint changes at L5-S1. No prior lumbar MRI imaging. No history of lumbar surgery/injections. Patient denies focal weakness, numbness and tingling. No recent trauma or falls.      Review of Systems  Musculoskeletal:  Positive for back pain.  Neurological:  Negative for tingling, sensory change, focal weakness and weakness.  All other systems reviewed and are negative.  Otherwise per HPI.  Assessment & Plan: Visit Diagnoses:    ICD-10-CM   1. Chronic bilateral low back pain with right-sided sciatica  G89.29 MR LUMBAR SPINE WO CONTRAST   M54.41     2. Lumbar radiculopathy  M54.16 MR LUMBAR SPINE WO CONTRAST       Plan: Findings:  Chronic, worsening and severe bilateral lower back pain radiating to right buttock and down posterior thigh to knee. Patient continues to have severe pain despite good conservative therapies such as formal physical therapy, home exercise regimen, rest and use of medications. Patients clinical presentation and exam are consistent with lumbar  radiculopathy, more of S1 nerve pattern. Recent lumbar radiographs show mild degenerative changes at L5-S1. We discussed treatment plan in detail today. Next step is to place order for lumbar MRI imaging. Depending on MRI imaging we discussed possibility of performing lumbar epidural steroid injection. I will see him back for MRI review. No red flag symptoms noted upon exam today.     Meds & Orders: No orders of the defined types were placed in this encounter.   Orders Placed This Encounter  Procedures   MR LUMBAR SPINE WO CONTRAST    Follow-up: Return for Lumbar MRI review.   Procedures: No procedures performed      Clinical History: Narrative & Impression CLINICAL DATA:  Chronic low back pain for 1 year.   EXAM: LUMBAR SPINE - 2-3 VIEW   COMPARISON:  None Available.   FINDINGS: There is no evidence of lumbar spine fracture. Alignment is normal. Mild narrow intervertebral space at L5-S1.   IMPRESSION: Mild degenerative joint changes at L5-S1.     Electronically Signed   By: Craig Farr M.D.   On: 01/23/2024 13:11   He reports that he has never smoked. He has never used smokeless tobacco.  Recent Labs    11/14/23 0000  HGBA1C 5.1    Objective:  VS:  HT:    WT:   BMI:     BP:   HR: bpm  TEMP: ( )  RESP:  Physical Exam Vitals and nursing note reviewed.  HENT:     Head: Normocephalic and atraumatic.  Right Ear: External ear normal.     Left Ear: External ear normal.     Nose: Nose normal.     Mouth/Throat:     Mouth: Mucous membranes are moist.  Eyes:     Extraocular Movements: Extraocular movements intact.  Cardiovascular:     Rate and Rhythm: Normal rate.     Pulses: Normal pulses.  Pulmonary:     Effort: Pulmonary effort is normal.  Abdominal:     General: Abdomen is flat. There is no distension.  Musculoskeletal:        General: Tenderness present.     Cervical back: Normal range of motion.     Comments: Patient rises from seated position  to standing without difficulty. Good lumbar range of motion. No pain noted with facet loading. 5/5 strength noted with bilateral hip flexion, knee flexion/extension, ankle dorsiflexion/plantarflexion and EHL. No clonus noted bilaterally. No pain upon palpation of greater trochanters. No pain with internal/external rotation of bilateral hips. Sensation intact bilaterally. Dysesthesias noted to right S1 dermatome. Negative slump test bilaterally. Ambulates without aid, gait steady.     Skin:    General: Skin is warm and dry.     Capillary Refill: Capillary refill takes less than 2 seconds.  Neurological:     General: No focal deficit present.     Mental Status: He is alert and oriented to person, place, and time.  Psychiatric:        Mood and Affect: Mood normal.        Behavior: Behavior normal.     Ortho Exam  Imaging: No results found.  Past Medical/Family/Surgical/Social History: Medications & Allergies reviewed per EMR, new medications updated. Patient Active Problem List   Diagnosis Date Noted   Mild sleep apnea 03/04/2024   PAF (paroxysmal atrial fibrillation) (HCC) 09/04/2023   Decreased libido 12/14/2022   Ectopic atrial rhythm 03/16/2022   Obesity (BMI 30.0-34.9) 03/16/2022   Dyspnea on exertion 03/16/2022   Palpitations 03/16/2022   Hepatitis 03/14/2022   Morbid obesity (HCC) 03/14/2022   Ocular rosacea 03/14/2022   Basal cell carcinoma (BCC) of left shoulder 03/14/2022   Basal cell carcinoma of face 03/14/2022   Basal cell carcinoma of right popliteal region 03/14/2022   Hemangioma of skin and subcutaneous tissue 03/14/2022   History of malignant neoplasm of skin 03/14/2022   Lentigo 03/14/2022   Viral warts 03/14/2022   Melanocytic nevi of left upper limb, including shoulder 03/14/2022   Melanocytic nevi of trunk 03/14/2022   Milia 03/14/2022   Neoplasm of uncertain behavior of skin 03/14/2022   Actinic keratosis 03/14/2022   Other seborrheic keratosis  03/14/2022   Other skin changes due to chronic exposure to nonionizing radiation 03/14/2022   Pseudofolliculitis barbae 03/14/2022   Elevated liver enzymes 07/19/2020   Medically noncompliant 07/16/2020   Post-nasal drip 07/16/2020   Pruritus ani 11/21/2018   Dysthymia 11/21/2018   Vasomotor rhinitis 11/21/2018   Night sweat 11/21/2018   Vitamin D  deficiency 09/23/2018   Chronic pain of right ankle 09/20/2018   Elevated blood sugar 09/20/2018   Alcohol use 09/20/2018   Acute appendicitis 07/31/2018   Anal inflammation 03/08/2018   Healthcare maintenance 09/10/2017   Low testosterone  09/10/2017   Leg wound, left 09/16/2014   Allergic rhinitis 09/16/2014   Lump in neck 04/22/2014   Left arm numbness 02/05/2014   Asthma 09/13/2013   Lapband APL + [facility] repair July 2013 04/24/2012   General medical examination 11/21/2011   Insomnia 05/03/2011   CHEST  WALL PAIN, ANTERIOR 09/22/2010   GERD 06/29/2009   GASTROENTERITIS 06/29/2009   CHEST PAIN 06/29/2009   ACHILLES TENDINITIS 12/28/2008   Acute frontal sinusitis 09/01/2008   OBESITY 03/31/2008   External hemorrhoids 03/31/2008   Essential hypertension 01/17/2007   Backache 01/17/2007   Past Medical History:  Diagnosis Date   ACHILLES TENDINITIS 12/28/2008   Qualifier: Diagnosis of  By: [name], [place]     Actinic keratosis 03/14/2022   Acute appendicitis 07/31/2018   Acute frontal sinusitis 09/01/2008   Qualifier: Diagnosis of  By: [name]     Alcohol use 09/20/2018   Allergic rhinitis 09/16/2014   Anal inflammation 03/08/2018   Asthma 09/13/2013   Backache 01/17/2007   Qualifier: Diagnosis of   By: [name], [name]      IMO SNOMED Dx Update Oct 2024     Basal cell carcinoma (BCC) of left shoulder 03/14/2022   Basal cell carcinoma of face 03/14/2022   Basal cell carcinoma of right popliteal region 03/14/2022   CHEST PAIN 06/29/2009   Qualifier: Diagnosis of  By: [name], [facility] WALL PAIN,  ANTERIOR 09/22/2010   Qualifier: Diagnosis of  By: [name], [name]     Chronic pain of right ankle 09/20/2018   Decreased libido 12/14/2022   occasional ED.     Dyspnea on exertion 03/16/2022   Dysthymia 11/21/2018   Ectopic atrial rhythm 03/16/2022   Elevated blood sugar 09/20/2018   Elevated liver enzymes 07/19/2020   Essential hypertension 01/17/2007   Qualifier: Diagnosis of  By: [name], [name]     External hemorrhoids 03/31/2008   Qualifier: Diagnosis of  By: [name], [name]    GASTROENTERITIS 06/29/2009   Qualifier: Diagnosis of  By: [name], [name] 11/21/2011   GERD 06/29/2009   Qualifier: Diagnosis of  By: [name], [facility]     Healthcare maintenance 09/10/2017   Hemangioma of skin and subcutaneous tissue 03/14/2022   Hepatitis    unknown type many yrs ago - no residual problems   History of malignant neoplasm of skin 03/14/2022   Insomnia    Lapband APL + [facility] repair July 2013 04/24/2012   Left arm numbness 02/05/2014   Leg wound, left 09/16/2014   Lentigo 03/14/2022   Low testosterone  09/10/2017   Lump in neck 04/22/2014   Medically noncompliant 07/16/2020   Melanocytic nevi of left upper limb, including shoulder 03/14/2022   Melanocytic nevi of trunk 03/14/2022   Mild sleep apnea 03/04/2024   02/08/24 Home sleep study ([facility]): AHI 5   Milia 03/14/2022   Morbid obesity (HCC)    Neoplasm of uncertain behavior of skin 03/14/2022   Night sweat 11/21/2018   OBESITY 03/31/2008   Qualifier: Diagnosis of  By: [name], [name]    Obesity (BMI 30.0-34.9) 03/16/2022   Ocular rosacea    Other seborrheic keratosis 03/14/2022   Other skin changes due to chronic exposure to nonionizing radiation 03/14/2022   Palpitations 03/16/2022   Post-nasal drip 07/16/2020   Pruritus ani 11/21/2018   Pseudofolliculitis barbae 03/14/2022   Vasomotor rhinitis 11/21/2018   Viral warts 03/14/2022   Vitamin D  deficiency 09/23/2018    Family History  Problem Relation Age of Onset   Hypertension Father    Diabetes Father    Heart disease Father        pacemaker   Colon cancer Neg Hx    Colon polyps Neg  Hx    Esophageal cancer Neg Hx    Rectal cancer Neg Hx    Stomach cancer Neg Hx    Past Surgical History:  Procedure Laterality Date   ANKLE ARTHROSCOPY WITH RECONSTRUCTION Right 09/26/2018   Procedure: ANKLE DEBRIDEMENT WITH POSSIBLE LATERAL LIGAMENT  RECONSTRUCTION;  Surgeon: [name], MD;  Location: [facility];  Service: Orthopedics;  Laterality: Right;   ATRIAL FIBRILLATION ABLATION N/A 01/04/2024   Procedure: ATRIAL FIBRILLATION ABLATION;  Surgeon: [name], MD;  Location: [facility];  Service: Cardiovascular;  Laterality: N/A;   HIATAL HERNIA REPAIR  04/23/2012   Procedure: HERNIA REPAIR HIATAL;  Surgeon: [name], MD;  Location: [facility];  Service: General;  Laterality: N/A;   KNEE SURGERY Right    fracture knee cap   LAPAROSCOPIC APPENDECTOMY N/A 07/31/2018   Procedure: APPENDECTOMY LAPAROSCOPIC;  Surgeon: [name], MD;  Location: [facility];  Service: General;  Laterality: N/A;   LAPAROSCOPIC GASTRIC BANDING  04/23/2012   Procedure: LAPAROSCOPIC GASTRIC BANDING;  Surgeon: [name], MD;  Location: [facility];  Service: General;  Laterality: N/A;   LASIK     Social History   Occupational History   Not on file  Tobacco Use   Smoking status: Never   Smokeless tobacco: Never   Tobacco comments:    Never smoked 01/924  Vaping Use   Vaping status: Never Used  Substance and Sexual Activity   Alcohol use: Yes    Alcohol/week: 5.0 standard drinks of alcohol    Types: 5 Shots of liquor per week    Comment: social   Drug use: Yes    Comment: thc gummy   Sexual activity: Not on file

## 2024-06-10 ENCOUNTER — Encounter: Payer: Self-pay | Admitting: Physical Medicine and Rehabilitation

## 2024-06-13 DIAGNOSIS — R7982 Elevated C-reactive protein (CRP): Secondary | ICD-10-CM | POA: Diagnosis not present

## 2024-06-13 DIAGNOSIS — E291 Testicular hypofunction: Secondary | ICD-10-CM | POA: Diagnosis not present

## 2024-06-13 DIAGNOSIS — Z0000 Encounter for general adult medical examination without abnormal findings: Secondary | ICD-10-CM | POA: Diagnosis not present

## 2024-06-13 DIAGNOSIS — E559 Vitamin D deficiency, unspecified: Secondary | ICD-10-CM | POA: Diagnosis not present

## 2024-06-13 DIAGNOSIS — G4700 Insomnia, unspecified: Secondary | ICD-10-CM | POA: Diagnosis not present

## 2024-06-16 ENCOUNTER — Other Ambulatory Visit: Payer: Self-pay

## 2024-06-18 ENCOUNTER — Emergency Department (HOSPITAL_BASED_OUTPATIENT_CLINIC_OR_DEPARTMENT_OTHER)
Admission: EM | Admit: 2024-06-18 | Discharge: 2024-06-18 | Disposition: A | Discharge status: Home / Self Care | Attending: Emergency Medicine | Admitting: Emergency Medicine

## 2024-06-18 ENCOUNTER — Encounter (HOSPITAL_BASED_OUTPATIENT_CLINIC_OR_DEPARTMENT_OTHER): Payer: Self-pay | Admitting: Emergency Medicine

## 2024-06-18 ENCOUNTER — Other Ambulatory Visit (HOSPITAL_BASED_OUTPATIENT_CLINIC_OR_DEPARTMENT_OTHER): Payer: Self-pay

## 2024-06-18 ENCOUNTER — Other Ambulatory Visit: Payer: Self-pay

## 2024-06-18 DIAGNOSIS — R42 Dizziness and giddiness: Secondary | ICD-10-CM | POA: Diagnosis not present

## 2024-06-18 DIAGNOSIS — R112 Nausea with vomiting, unspecified: Secondary | ICD-10-CM | POA: Diagnosis not present

## 2024-06-18 DIAGNOSIS — R197 Diarrhea, unspecified: Secondary | ICD-10-CM | POA: Diagnosis not present

## 2024-06-18 DIAGNOSIS — R109 Unspecified abdominal pain: Secondary | ICD-10-CM | POA: Diagnosis not present

## 2024-06-18 DIAGNOSIS — R519 Headache, unspecified: Secondary | ICD-10-CM | POA: Diagnosis not present

## 2024-06-18 DIAGNOSIS — R531 Weakness: Secondary | ICD-10-CM | POA: Diagnosis not present

## 2024-06-18 LAB — CBC WITH DIFFERENTIAL/PLATELET
Abs Immature Granulocytes: 0.01 K/uL (ref 0.00–0.07)
Basophils Absolute: 0 K/uL (ref 0.0–0.1)
Basophils Relative: 0 %
Eosinophils Absolute: 0 K/uL (ref 0.0–0.5)
Eosinophils Relative: 0 %
HCT: 40.3 % (ref 39.0–52.0)
Hemoglobin: 13.7 g/dL (ref 13.0–17.0)
Immature Granulocytes: 0 %
Lymphocytes Relative: 12 %
Lymphs Abs: 0.6 K/uL — ABNORMAL LOW (ref 0.7–4.0)
MCH: 33 pg (ref 26.0–34.0)
MCHC: 34 g/dL (ref 30.0–36.0)
MCV: 97.1 fL (ref 80.0–100.0)
Monocytes Absolute: 0.3 K/uL (ref 0.1–1.0)
Monocytes Relative: 7 %
Neutro Abs: 4.1 K/uL (ref 1.7–7.7)
Neutrophils Relative %: 81 %
Platelets: 166 K/uL (ref 150–400)
RBC: 4.15 MIL/uL — ABNORMAL LOW (ref 4.22–5.81)
RDW: 12.5 % (ref 11.5–15.5)
WBC: 5.1 K/uL (ref 4.0–10.5)
nRBC: 0 % (ref 0.0–0.2)

## 2024-06-18 LAB — URINALYSIS, MICROSCOPIC (REFLEX)

## 2024-06-18 LAB — URINALYSIS, ROUTINE W REFLEX MICROSCOPIC
Bilirubin Urine: NEGATIVE
Glucose, UA: NEGATIVE mg/dL
Hgb urine dipstick: NEGATIVE
Ketones, ur: NEGATIVE mg/dL
Leukocytes,Ua: NEGATIVE
Nitrite: NEGATIVE
Protein, ur: 30 mg/dL — AB
Specific Gravity, Urine: 1.02 (ref 1.005–1.030)
pH: 6 (ref 5.0–8.0)

## 2024-06-18 LAB — COMPREHENSIVE METABOLIC PANEL WITH GFR
ALT: 17 U/L (ref 0–44)
AST: 20 U/L (ref 15–41)
Albumin: 4.1 g/dL (ref 3.5–5.0)
Alkaline Phosphatase: 85 U/L (ref 38–126)
Anion gap: 10 (ref 5–15)
BUN: 15 mg/dL (ref 6–20)
CO2: 26 mmol/L (ref 22–32)
Calcium: 8.8 mg/dL — ABNORMAL LOW (ref 8.9–10.3)
Chloride: 100 mmol/L (ref 98–111)
Creatinine, Ser: 1 mg/dL (ref 0.61–1.24)
GFR, Estimated: >60 mL/min (ref >60)
Glucose, Bld: 109 mg/dL — ABNORMAL HIGH (ref 70–99)
Potassium: 3.5 mmol/L (ref 3.5–5.1)
Sodium: 136 mmol/L (ref 135–145)
Total Bilirubin: 0.6 mg/dL (ref 0.0–1.2)
Total Protein: 6.3 g/dL — ABNORMAL LOW (ref 6.5–8.1)

## 2024-06-18 LAB — LIPASE, BLOOD: Lipase: 17 U/L (ref 11–51)

## 2024-06-18 LAB — RESP PANEL BY RT-PCR (RSV, FLU A&B, COVID)  RVPGX2
Influenza A by PCR: NEGATIVE
Influenza B by PCR: NEGATIVE
Resp Syncytial Virus by PCR: NEGATIVE
SARS Coronavirus 2 by RT PCR: NEGATIVE

## 2024-06-18 MED ORDER — ONDANSETRON 4 MG PO TBDP
4.0000 mg | ORAL_TABLET | Freq: Three times a day (TID) | ORAL | 0 refills | Status: AC
  Filled 2024-06-18: qty 15, 5d supply, fill #0

## 2024-06-18 MED ORDER — SODIUM CHLORIDE 0.9 % IV BOLUS
1000.0000 mL | Freq: Once | INTRAVENOUS | Status: AC
  Administered 2024-06-18: 1000 mL via INTRAVENOUS

## 2024-06-18 MED ORDER — KETOROLAC TROMETHAMINE 15 MG/ML IJ SOLN
15.0000 mg | Freq: Once | INTRAMUSCULAR | Status: AC
  Administered 2024-06-18: 15 mg via INTRAVENOUS
  Filled 2024-06-18: qty 1

## 2024-06-18 MED ORDER — SODIUM CHLORIDE 0.9 % IV BOLUS
500.0000 mL | Freq: Once | INTRAVENOUS | Status: AC
  Administered 2024-06-18: 500 mL via INTRAVENOUS

## 2024-06-18 MED ORDER — ONDANSETRON HCL 4 MG/2ML IJ SOLN
4.0000 mg | Freq: Once | INTRAMUSCULAR | Status: AC
  Administered 2024-06-18: 4 mg via INTRAVENOUS
  Filled 2024-06-18: qty 2

## 2024-06-18 NOTE — ED Notes (Signed)

## 2024-06-18 NOTE — ED Provider Notes (Signed)
 [facility] Provider Note   CSN: [id number] Arrival date & time: 06/18/24  0944     Patient presents with: Emesis   Gilbert [name] is a 53 y.o. male.  53 year old male presents to ED with complaints of nausea, vomiting, diarrhea, headache, weakness, dizziness and abdominal pain x 1 day.  Patient has significant history of ablation in March for A-fib.  Patient advises that he has not been able to keep down food and has only been able to keep down small amounts of fluid.  Patient denies any chest pain, shortness of breath, blood in urine or diarrhea.     Prior to Admission medications   Medication Sig Start Date End Date Taking? Authorizing Provider  buPROPion  (WELLBUTRIN  XL) 150 MG 24 hr tablet Take 1 tablet (150 mg total) by mouth every morning. 03/19/24     doxycycline  (VIBRAMYCIN ) 50 MG capsule Take 1 capsule (50 mg total) by mouth daily with food. 01/30/24     doxycycline  (VIBRAMYCIN ) 50 MG capsule Take 1 capsule (50 mg total) by mouth daily with food. 03/31/24     eszopiclone  (LUNESTA ) 2 MG TABS tablet Take 1 tablet (2 mg total) by mouth at bedtime. 03/19/24     hydrocortisone  (ANUSOL -HC) 25 MG suppository Unwrap and place 1 suppository (25 mg total) rectally 2 (two) times daily as needed for hemorrhoids or anal itching. 09/28/23   Almarie Waddell NOVAK, NP  levocetirizine (XYZAL) 5 MG tablet Take 5 mg by mouth every evening.    [provider]  ondansetron  (ZOFRAN -ODT) 4 MG disintegrating tablet Take 1 tablet (4 mg total) by mouth every 8 (eight) hours as directed. 06/18/24     sildenafil  (VIAGRA ) 100 MG tablet Take 0.5-1 tablets (50-100 mg total) by mouth daily as needed for erectile dysfunction. 01/18/24   Almarie Waddell NOVAK, NP  testosterone  cypionate (DEPOTESTOSTERONE CYPIONATE) 200 MG/ML injection Inject 0.5 mLs (100 mg total) into the muscle once a week. 02/26/24     testosterone  cypionate (DEPOTESTOSTERONE CYPIONATE) 200 MG/ML injection Inject  0.25 mLs (50 mg total) into the muscle once a week. 03/19/24     triamcinolone  cream (KENALOG ) 0.5 % Apply 1 Application topically 2 (two) times daily as needed. 12/13/23     UNABLE TO FIND Take 1 capsule by mouth at bedtime. Med Name: Cortisol Manager    [provider]  UNABLE TO FIND Take 1 capsule by mouth at bedtime. Med Name: Tri-Magnesium    [provider]  UNABLE TO FIND Take 1 capsule by mouth every morning. Med Name: Methyl B12    [provider]  UNABLE TO FIND Take 1 capsule by mouth every morning. Med Name: D3 10 and Vitamin K    [provider]    Allergies: Penicillins    Review of Systems  Gastrointestinal:  Positive for diarrhea, nausea and vomiting.  All other systems reviewed and are negative.   Updated Vital Signs BP 108/86   Pulse (!) 49   Temp (!) 97.5 F (36.4 C) (Oral)   Resp 16   Wt 88.5 kg   SpO2 100%   BMI 25.04 kg/m   Physical Exam Vitals and nursing note reviewed.  Constitutional:      Appearance: Normal appearance.  HENT:     Head: Normocephalic and atraumatic.     Right Ear: Tympanic membrane and ear canal normal.     Left Ear: Tympanic membrane and ear canal normal.     Nose: Nose  normal.     Mouth/Throat:     Mouth: Mucous membranes are moist.     Pharynx: No oropharyngeal exudate or posterior oropharyngeal erythema.  Eyes:     Extraocular Movements: Extraocular movements intact.     Conjunctiva/sclera: Conjunctivae normal.     Pupils: Pupils are equal, round, and reactive to light.  Cardiovascular:     Rate and Rhythm: Normal rate.  Pulmonary:     Effort: Pulmonary effort is normal. No respiratory distress.     Breath sounds: Normal breath sounds.  Abdominal:     General: Abdomen is flat. There is no distension.     Palpations: Abdomen is soft.     Tenderness: There is no abdominal tenderness. There is no guarding or rebound.  Musculoskeletal:        General: No swelling, tenderness or signs of  injury. Normal range of motion.     Cervical back: Normal range of motion.  Skin:    General: Skin is warm.     Capillary Refill: Capillary refill takes less than 2 seconds.  Neurological:     General: No focal deficit present.     Mental Status: He is alert and oriented to person, place, and time.  Psychiatric:        Mood and Affect: Mood normal.        Behavior: Behavior normal.     (all labs ordered are listed, but only abnormal results are displayed) Labs Reviewed  CBC WITH DIFFERENTIAL/PLATELET - Abnormal; Notable for the following components:      Result Value   RBC 4.15 (*)    Lymphs Abs 0.6 (*)    All other components within normal limits  URINALYSIS, ROUTINE W REFLEX MICROSCOPIC - Abnormal; Notable for the following components:   Protein, ur 30 (*)    All other components within normal limits  COMPREHENSIVE METABOLIC PANEL WITH GFR - Abnormal; Notable for the following components:   Glucose, Bld 109 (*)    Calcium 8.8 (*)    Total Protein 6.3 (*)    All other components within normal limits  URINALYSIS, MICROSCOPIC (REFLEX) - Abnormal; Notable for the following components:   Bacteria, UA RARE (*)    All other components within normal limits  RESP PANEL BY RT-PCR (RSV, FLU A&B, COVID)  RVPGX2  LIPASE, BLOOD    EKG: EKG Interpretation Date/Time:  Wednesday June 18 2024 12:35:33 EDT Ventricular Rate:  40 PR Interval:  89 QRS Duration:  96 QT Interval:  494 QTC Calculation: 403 R Axis:   74  Text Interpretation: Sinus or ectopic atrial bradycardia Short PR interval Confirmed by Freddi Hamilton [id number]) on 06/18/2024 1:15:53 PM  Radiology: No results found.   Procedures   Medications Ordered in the ED  sodium chloride  0.9 % bolus 1,000 mL (0 mLs Intravenous Stopped 06/18/24 1201)  ondansetron  (ZOFRAN ) injection 4 mg (4 mg Intravenous Given 06/18/24 1108)  ketorolac  (TORADOL ) 15 MG/ML injection 15 mg (15 mg Intravenous Given 06/18/24 1256)  sodium  chloride 0.9 % bolus 500 mL (0 mLs Intravenous Stopped 06/18/24 1345)   53 y.o. male presents to the ED with complaints of nausea, vomiting, headache, weakness, dizziness, abdominal pain, congestion, this involves an extensive number of treatment options, and is a complaint that carries with it a high risk of complications and morbidity.  The differential diagnosis includes flu, COVID, RSV, arrhythmia, gastritis, appendicitis, pancreatitis, cholecystitis, no obstruction, sinus congestion, strep throat, other viral illness.  (Ddx)  On arrival pt is  nontoxic, vitals unremarkable. Exam significant for active nausea.  Additional history obtained from significant for chronic low back pain with sciatica managed by Ortho care.  I ordered medication Zofran  for nausea  Lab Tests:  I Ordered, reviewed, and interpreted labs, which included: CMP, CBC, UA.  ED Course:   53 year old male presents ED with complaints of nausea vomiting headache weakness dizziness and abdominal pain with congestion and sore throat x 1 day.  Patient is nontoxic-appearing and in no acute distress during initial exam.  Patient has no pain to palpation throughout abdomen and denies any urinary symptoms.  Patient only has significant surgical history of lap band surgery for obesity.  Patient does have history of ablation for A-fib in March 2025.  Patient denies any chest pain or feeling of palpitation.  Patient does have endorsed weakness and EKG will be ordered to rule out any acute dysrhythmias.  No erythema or exudates on tonsils strep throat is not highly suspicious.  Patient has not had trouble swallowing and has been able to keep down low amounts of fluid.  Patient reports he has not been eating as much over the last day.  Patient also endorses some mild headache and weakness.  Initial plan is to rule out any abdominal etiologies with lab work and rule out UTI.  Patient will be treated symptomatically with Zofran  and fluids.  On  reassessment patient reported he was starting to feel little bit better and he is not currently nauseous.  He does still endorse a headache and patient will be given Toradol  to help with headache and another bag of fluid.  Patient all lab work is nonconcerning at this time and patient will be trialed on p.o. before discharge.  Still pending EKG.  EKG was unremarkable for A-fib or signs of ischemia.  Patient was able to eat food and reported Toradol  helped significantly with headache.  Patient was advised of findings of blood work and no concerning findings found on exam or labs.  Patient reports he was feeling better and was comfortable discharge.  Patient was advised of strict return precautions and symptoms to monitor for.  Patient agreed with treatment plan and was comfortable discharge.   Portions of this note were generated with Scientist, clinical (histocompatibility and immunogenetics). Dictation errors may occur despite best attempts at proofreading.    Final diagnoses:  Nausea vomiting and diarrhea    ED Discharge Orders     None          Myriam Fonda GORMAN DEVONNA 06/18/24 1505    Freddi Hamilton, MD 06/19/24 [phone]

## 2024-06-18 NOTE — ED Triage Notes (Signed)
 Emesis x 3 days , diarrhea . Lethargic .

## 2024-06-18 NOTE — Discharge Instructions (Addendum)
 Lab work and EKG were reassuring today.  You did have an active prescription for Zofran  that was prescribed today so I will not add another prescription for it.  Take the Zofran  as prescribed to help with nausea.  It is advised to continue to drink plenty of fluids and take Tylenol  and ibuprofen as needed.  Monitor for worsening symptoms including worsening abdominal pain, urinary symptoms, severe headache, chest pain, shortness of breath, or fainting.  If concerning symptoms arise return to the ED for further evaluation.

## 2024-06-23 ENCOUNTER — Ambulatory Visit
Admission: RE | Admit: 2024-06-23 | Discharge: 2024-06-23 | Disposition: A | Discharge status: Home / Self Care | Source: Ambulatory Visit | Attending: Physical Medicine and Rehabilitation | Admitting: Physical Medicine and Rehabilitation

## 2024-06-23 DIAGNOSIS — M47816 Spondylosis without myelopathy or radiculopathy, lumbar region: Secondary | ICD-10-CM | POA: Diagnosis not present

## 2024-06-23 DIAGNOSIS — G8929 Other chronic pain: Secondary | ICD-10-CM

## 2024-06-23 DIAGNOSIS — M5416 Radiculopathy, lumbar region: Secondary | ICD-10-CM

## 2024-06-23 DIAGNOSIS — M5126 Other intervertebral disc displacement, lumbar region: Secondary | ICD-10-CM | POA: Diagnosis not present

## 2024-06-23 DIAGNOSIS — M4316 Spondylolisthesis, lumbar region: Secondary | ICD-10-CM | POA: Diagnosis not present

## 2024-06-23 DIAGNOSIS — M48061 Spinal stenosis, lumbar region without neurogenic claudication: Secondary | ICD-10-CM | POA: Diagnosis not present

## 2024-07-01 ENCOUNTER — Other Ambulatory Visit: Payer: Self-pay

## 2024-07-01 ENCOUNTER — Other Ambulatory Visit (HOSPITAL_BASED_OUTPATIENT_CLINIC_OR_DEPARTMENT_OTHER): Payer: Self-pay

## 2024-07-11 ENCOUNTER — Other Ambulatory Visit (HOSPITAL_BASED_OUTPATIENT_CLINIC_OR_DEPARTMENT_OTHER): Payer: Self-pay

## 2024-07-14 ENCOUNTER — Ambulatory Visit: Admitting: Physical Medicine and Rehabilitation

## 2024-07-14 ENCOUNTER — Encounter: Payer: Self-pay | Admitting: Physical Medicine and Rehabilitation

## 2024-07-14 DIAGNOSIS — M5416 Radiculopathy, lumbar region: Secondary | ICD-10-CM

## 2024-07-14 DIAGNOSIS — G8929 Other chronic pain: Secondary | ICD-10-CM

## 2024-07-14 DIAGNOSIS — M5441 Lumbago with sciatica, right side: Secondary | ICD-10-CM

## 2024-07-14 DIAGNOSIS — M5116 Intervertebral disc disorders with radiculopathy, lumbar region: Secondary | ICD-10-CM

## 2024-07-14 NOTE — Progress Notes (Unsigned)
 Gilbert [name] - 53 y.o. male MRN [id number]  Date of birth: [birth year]  Office Visit Note: Visit Date: 07/14/2024 PCP: Almarie Waddell NOVAK, NP Referred by: Almarie Waddell NOVAK, NP  Subjective: Chief Complaint  Patient presents with   Lower Back - Pain   HPI: Gilbert [name] is a 53 y.o. male who comes in today for evaluation of chronic, worsening and severe bilateral lower back pain, intermittent radiation to buttocks and down posterior legs. He is here today for lumbar MRI review. Pain ongoing for several years, worsens with movement and activity. He describes pain as sore and aching sensation, currently rates as 5 out of 10.  Some relief of pain with home exercise regimen, rest and use of medications. He recently finished formal physical therapy, minimal relief of pain with these treatments. Patient is avid runner, recently completed 5K, reports intermittent issues with bilateral knee pain. Recent lumbar MRI imaging shows multi level degenerative changes, there is shallow central to left subarticular disc protrusion at L5-S1, closely approximating the descending S1 nerve roots without overt impingement, greater on the left. No high grade spinal canal stenosis. Patient denies focal weakness, numbness and tingling. No recent trauma or falls.       Review of Systems  Musculoskeletal:  Positive for back pain.  Neurological:  Negative for tingling, sensory change, focal weakness and weakness.  All other systems reviewed and are negative.  Otherwise per HPI.  Assessment & Plan: Visit Diagnoses:    ICD-10-CM   1. Chronic bilateral low back pain with right-sided sciatica  G89.29 DG INJECT DIAG/THERA/INC NEEDLE/CATH/PLC EPI/LUMB/SAC W/IMG   M54.41     2. Lumbar radiculopathy  M54.16 DG INJECT DIAG/THERA/INC NEEDLE/CATH/PLC EPI/LUMB/SAC W/IMG    3. Intervertebral disc disorders with radiculopathy, lumbar region  M51.16 DG INJECT DIAG/THERA/INC NEEDLE/CATH/PLC EPI/LUMB/SAC W/IMG       Plan:  Findings:  Chronic, worsening and severe bilateral lower back pain, intermittent pain radiating to buttocks and down posterior legs. Patient continues to have pain despite good conservative therapies such as formal physical therapy, home exercise regimen, rest and use of medications. I discussed recent lumbar MRI with him today using imaging and spine model. There is left subarticular disc protrusion at L5-S1 closely approximating the S1 nerve roots. Also annular fissure at this level. No high grade spinal canal stenosis noted. We discussed treatment plan in detail today. Next step is to perform diagnostic and hopefully therapeutic L5-S1 interlaminar epidural steroid injection under fluoroscopic guidance. Patient informed me that he is changing insurance carrier at the end of the month and would like to have injection ASAP. I did place order for injection to be done at [facility]. He has no questions at this time. We are happy to see him back as needed. No red flag symptoms noted upon exam today.     Meds & Orders: No orders of the defined types were placed in this encounter.   Orders Placed This Encounter  Procedures   DG INJECT DIAG/THERA/INC NEEDLE/CATH/PLC EPI/LUMB/SAC W/IMG    Follow-up: Return if symptoms worsen or fail to improve.   Procedures: No procedures performed      Clinical History: CLINICAL DATA:  Initial evaluation for chronic lower back pain with right-sided sciatica.   EXAM: MRI LUMBAR SPINE WITHOUT CONTRAST   TECHNIQUE: Multiplanar, multisequence MR imaging of the lumbar spine was performed. No intravenous contrast was administered.   COMPARISON:  Radiograph from 01/18/2024.   FINDINGS: Segmentation: Standard. Lowest well-formed disc space labeled the  L5-S1 level.   Alignment: Straightening of the normal lumbar lordosis. 2 mm degenerative retrolisthesis of L2 on L3 and L5 on S1.   Vertebrae: Vertebral body height maintained without acute or  chronic fracture. Bone marrow signal intensity mildly heterogeneous but overall within normal limits. No worrisome osseous lesions. Degenerative reactive endplate change with mild marrow edema present about the L2-3 interspace. No other significant abnormal marrow edema.   Conus medullaris and cauda equina: Conus extends to the L1 level. Conus and cauda equina appear normal.   Paraspinal and other soft tissues: Unremarkable.   Disc levels:   T12-L1: Unremarkable.   L1-2:  Unremarkable.   L2-3: Degenerative intervertebral disc space narrowing with diffuse disc bulge and disc desiccation. Reactive endplate change with marginal endplate osteophytic spurring. Superimposed small left foraminal disc protrusion (series 304, image 12). Annular fissuring noted. Mild bilateral facet hypertrophy. No significant spinal stenosis. Mild to moderate bilateral L2 foraminal stenosis.   L3-4: Normal interspace. Mild bilateral facet hypertrophy. No spinal stenosis. Foramina remain patent.   L4-5: Normal interspace. Mild to moderate bilateral facet hypertrophy. No spinal stenosis. Foramina remain patent.   L5-S1: Degenerative intervertebral disc space narrowing with disc desiccation and diffuse disc bulge. Superimposed shallow broad-based central to left subarticular disc protrusion with annular fissure flattens the ventral thecal sac (series 305, image 37). Protruding disc closely approximates both of the descending S1 nerve roots without overt neural impingement or displacement, greater on the left. Mild facet spurring. Resultant mild narrowing of the left lateral recess. Central canal remains patent. Mild left L5 foraminal narrowing. Right neural foramen remains patent.   IMPRESSION: 1. Degenerative disc bulge with small left foraminal disc protrusion at L2-3 with resultant mild to moderate bilateral L2 foraminal stenosis. 2. Shallow central to left subarticular disc protrusion at  L5-S1, closely approximating the descending S1 nerve roots without overt impingement, greater on the left. 3. Mild to moderate bilateral facet hypertrophy at L2-3 through L5-S1.     Electronically Signed   By: Morene Hoard M.D.   On: 06/24/2024 06:11   He reports that he has never smoked. He has never used smokeless tobacco.  Recent Labs    11/14/23 0000  HGBA1C 5.1    Objective:  VS:  HT:    WT:   BMI:     BP:   HR: bpm  TEMP: ( )  RESP:  Physical Exam Vitals and nursing note reviewed.  HENT:     Head: Normocephalic and atraumatic.     Right Ear: External ear normal.     Left Ear: External ear normal.     Nose: Nose normal.     Mouth/Throat:     Mouth: Mucous membranes are moist.  Eyes:     Extraocular Movements: Extraocular movements intact.  Cardiovascular:     Rate and Rhythm: Normal rate.     Pulses: Normal pulses.  Pulmonary:     Effort: Pulmonary effort is normal.  Abdominal:     General: Abdomen is flat. There is no distension.  Musculoskeletal:        General: Tenderness present.     Cervical back: Normal range of motion.     Comments: Patient rises from seated position to standing without difficulty. Good lumbar range of motion. No pain noted with facet loading. 5/5 strength noted with bilateral hip flexion, knee flexion/extension, ankle dorsiflexion/plantarflexion and EHL. No clonus noted bilaterally. No pain upon palpation of greater trochanters. No pain with internal/external rotation of bilateral hips. Sensation  intact bilaterally. Negative slump test bilaterally. Ambulates without aid, gait steady.     Skin:    General: Skin is warm and dry.     Capillary Refill: Capillary refill takes less than 2 seconds.  Neurological:     General: No focal deficit present.     Mental Status: He is alert.  Psychiatric:        Mood and Affect: Mood normal.        Behavior: Behavior normal.     Ortho Exam  Imaging: No results found.  Past  Medical/Family/Surgical/Social History: Medications & Allergies reviewed per EMR, new medications updated. Patient Active Problem List   Diagnosis Date Noted   Mild sleep apnea 03/04/2024   PAF (paroxysmal atrial fibrillation) (HCC) 09/04/2023   Decreased libido 12/14/2022   Ectopic atrial rhythm 03/16/2022   Obesity (BMI 30.0-34.9) 03/16/2022   Dyspnea on exertion 03/16/2022   Palpitations 03/16/2022   Hepatitis 03/14/2022   Morbid obesity (HCC) 03/14/2022   Ocular rosacea 03/14/2022   Basal cell carcinoma (BCC) of left shoulder 03/14/2022   Basal cell carcinoma of face 03/14/2022   Basal cell carcinoma of right popliteal region 03/14/2022   Hemangioma of skin and subcutaneous tissue 03/14/2022   History of malignant neoplasm of skin 03/14/2022   Lentigo 03/14/2022   Viral warts 03/14/2022   Melanocytic nevi of left upper limb, including shoulder 03/14/2022   Melanocytic nevi of trunk 03/14/2022   Milia 03/14/2022   Neoplasm of uncertain behavior of skin 03/14/2022   Actinic keratosis 03/14/2022   Other seborrheic keratosis 03/14/2022   Other skin changes due to chronic exposure to nonionizing radiation 03/14/2022   Pseudofolliculitis barbae 03/14/2022   Elevated liver enzymes 07/19/2020   Medically noncompliant 07/16/2020   Post-nasal drip 07/16/2020   Pruritus ani 11/21/2018   Dysthymia 11/21/2018   Vasomotor rhinitis 11/21/2018   Night sweat 11/21/2018   Vitamin D  deficiency 09/23/2018   Chronic pain of right ankle 09/20/2018   Elevated blood sugar 09/20/2018   Alcohol use 09/20/2018   Acute appendicitis 07/31/2018   Anal inflammation 03/08/2018   Healthcare maintenance 09/10/2017   Low testosterone  09/10/2017   Leg wound, left 09/16/2014   Allergic rhinitis 09/16/2014   Lump in neck 04/22/2014   Left arm numbness 02/05/2014   Asthma 09/13/2013   Lapband APL + HH repair July 2013 04/24/2012   General medical examination 11/21/2011   Insomnia 05/03/2011   CHEST  WALL PAIN, ANTERIOR 09/22/2010   GERD 06/29/2009   GASTROENTERITIS 06/29/2009   CHEST PAIN 06/29/2009   ACHILLES TENDINITIS 12/28/2008   Acute frontal sinusitis 09/01/2008   OBESITY 03/31/2008   External hemorrhoids 03/31/2008   Essential hypertension 01/17/2007   Backache 01/17/2007   Past Medical History:  Diagnosis Date   ACHILLES TENDINITIS 12/28/2008   Qualifier: Diagnosis of  By: [name], [place]     Actinic keratosis 03/14/2022   Acute appendicitis 07/31/2018   Acute frontal sinusitis 09/01/2008   Qualifier: Diagnosis of  By: [name]     Alcohol use 09/20/2018   Allergic rhinitis 09/16/2014   Anal inflammation 03/08/2018   Asthma 09/13/2013   Backache 01/17/2007   Qualifier: Diagnosis of   By: [name], [name]      IMO SNOMED Dx Update Oct 2024     Basal cell carcinoma (BCC) of left shoulder 03/14/2022   Basal cell carcinoma of face 03/14/2022   Basal cell carcinoma of right popliteal region 03/14/2022   CHEST PAIN 06/29/2009   Qualifier:  Diagnosis of  By: [name], [facility] WALL PAIN, ANTERIOR 09/22/2010   Qualifier: Diagnosis of  By: [name], [place]     Chronic pain of right ankle 09/20/2018   Decreased libido 12/14/2022   occasional ED.     Dyspnea on exertion 03/16/2022   Dysthymia 11/21/2018   Ectopic atrial rhythm 03/16/2022   Elevated blood sugar 09/20/2018   Elevated liver enzymes 07/19/2020   Essential hypertension 01/17/2007   Qualifier: Diagnosis of  By: [name], [name]     External hemorrhoids 03/31/2008   Qualifier: Diagnosis of  By: [name], [name]    GASTROENTERITIS 06/29/2009   Qualifier: Diagnosis of  By: [name], [name] 11/21/2011   GERD 06/29/2009   Qualifier: Diagnosis of  By: [name], [facility]     Healthcare maintenance 09/10/2017   Hemangioma of skin and subcutaneous tissue 03/14/2022   Hepatitis    unknown type many yrs ago - no residual problems   History of  malignant neoplasm of skin 03/14/2022   Insomnia    Lapband APL + [facility] repair July 2013 04/24/2012   Left arm numbness 02/05/2014   Leg wound, left 09/16/2014   Lentigo 03/14/2022   Low testosterone  09/10/2017   Lump in neck 04/22/2014   Medically noncompliant 07/16/2020   Melanocytic nevi of left upper limb, including shoulder 03/14/2022   Melanocytic nevi of trunk 03/14/2022   Mild sleep apnea 03/04/2024   02/08/24 Home sleep study ([facility]): AHI 5   Milia 03/14/2022   Morbid obesity (HCC)    Neoplasm of uncertain behavior of skin 03/14/2022   Night sweat 11/21/2018   OBESITY 03/31/2008   Qualifier: Diagnosis of  By: [name], [name]    Obesity (BMI 30.0-34.9) 03/16/2022   Ocular rosacea    Other seborrheic keratosis 03/14/2022   Other skin changes due to chronic exposure to nonionizing radiation 03/14/2022   Palpitations 03/16/2022   Post-nasal drip 07/16/2020   Pruritus ani 11/21/2018   Pseudofolliculitis barbae 03/14/2022   Vasomotor rhinitis 11/21/2018   Viral warts 03/14/2022   Vitamin D  deficiency 09/23/2018   Family History  Problem Relation Age of Onset   Hypertension Father    Diabetes Father    Heart disease Father        pacemaker   Colon cancer Neg Hx    Colon polyps Neg Hx    Esophageal cancer Neg Hx    Rectal cancer Neg Hx    Stomach cancer Neg Hx    Past Surgical History:  Procedure Laterality Date   ANKLE ARTHROSCOPY WITH RECONSTRUCTION Right 09/26/2018   Procedure: ANKLE DEBRIDEMENT WITH POSSIBLE LATERAL LIGAMENT  RECONSTRUCTION;  Surgeon: [name], MD;  Location: [facility] OR;  Service: Orthopedics;  Laterality: Right;   ATRIAL FIBRILLATION ABLATION N/A 01/04/2024   Procedure: ATRIAL FIBRILLATION ABLATION;  Surgeon: [name], MD;  Location: [facility] INVASIVE CV LAB;  Service: Cardiovascular;  Laterality: N/A;   HIATAL HERNIA REPAIR  04/23/2012   Procedure: HERNIA REPAIR HIATAL;  Surgeon: [name], MD;  Location: [facility] ORS;   Service: General;  Laterality: N/A;   KNEE SURGERY Right    fracture knee cap   LAPAROSCOPIC APPENDECTOMY N/A 07/31/2018   Procedure: APPENDECTOMY LAPAROSCOPIC;  Surgeon: [name], MD;  Location: [facility] ORS;  Service: General;  Laterality: N/A;   LAPAROSCOPIC GASTRIC BANDING  04/23/2012   Procedure: LAPAROSCOPIC GASTRIC BANDING;  Surgeon: Donnice KATHEE  Gladis, MD;  Location: [facility] ORS;  Service: General;  Laterality: N/A;   LASIK     Social History   Occupational History   Not on file  Tobacco Use   Smoking status: Never   Smokeless tobacco: Never   Tobacco comments:    Never smoked 01/924  Vaping Use   Vaping status: Never Used  Substance and Sexual Activity   Alcohol use: Yes    Alcohol/week: 5.0 standard drinks of alcohol    Types: 5 Shots of liquor per week    Comment: social   Drug use: Yes    Comment: thc gummy   Sexual activity: Not on file

## 2024-07-14 NOTE — Progress Notes (Unsigned)
 Pain Scale   Average Pain 2   Patient has lost about 50lbs since is back pain first began. He says that his back pain is improved, but not gone completely. He did run a 10k this weekend and felt okay. He will occasionally have pain in the left leg to the knee, but says that this leg pain is not common.

## 2024-07-17 ENCOUNTER — Encounter: Payer: Self-pay | Admitting: Physical Medicine and Rehabilitation

## 2024-07-17 ENCOUNTER — Other Ambulatory Visit (HOSPITAL_COMMUNITY): Payer: Self-pay

## 2024-07-21 ENCOUNTER — Other Ambulatory Visit: Payer: Self-pay

## 2024-07-22 NOTE — Discharge Instructions (Signed)

## 2024-07-23 ENCOUNTER — Ambulatory Visit
Admission: RE | Admit: 2024-07-23 | Discharge: 2024-07-23 | Disposition: A | Discharge status: Home / Self Care | Source: Ambulatory Visit | Attending: Physical Medicine and Rehabilitation | Admitting: Physical Medicine and Rehabilitation

## 2024-07-23 DIAGNOSIS — M5116 Intervertebral disc disorders with radiculopathy, lumbar region: Secondary | ICD-10-CM

## 2024-07-23 DIAGNOSIS — G8929 Other chronic pain: Secondary | ICD-10-CM

## 2024-07-23 DIAGNOSIS — M5416 Radiculopathy, lumbar region: Secondary | ICD-10-CM

## 2024-07-23 MED ORDER — METHYLPREDNISOLONE ACETATE 40 MG/ML INJ SUSP (RADIOLOG
80.0000 mg | Freq: Once | INTRAMUSCULAR | Status: AC
  Administered 2024-07-23: 80 mg via EPIDURAL

## 2024-07-23 MED ORDER — IOPAMIDOL (ISOVUE-M 200) INJECTION 41%
1.0000 mL | Freq: Once | INTRAMUSCULAR | Status: AC
  Administered 2024-07-23: 1 mL via EPIDURAL

## 2024-07-28 ENCOUNTER — Encounter: Payer: Self-pay | Admitting: Radiology

## 2024-08-05 ENCOUNTER — Other Ambulatory Visit (HOSPITAL_BASED_OUTPATIENT_CLINIC_OR_DEPARTMENT_OTHER): Payer: Self-pay

## 2024-08-05 ENCOUNTER — Other Ambulatory Visit: Payer: Self-pay

## 2024-08-11 ENCOUNTER — Other Ambulatory Visit (HOSPITAL_BASED_OUTPATIENT_CLINIC_OR_DEPARTMENT_OTHER): Payer: Self-pay

## 2024-08-18 ENCOUNTER — Other Ambulatory Visit: Payer: Self-pay

## 2024-08-18 ENCOUNTER — Other Ambulatory Visit (HOSPITAL_BASED_OUTPATIENT_CLINIC_OR_DEPARTMENT_OTHER): Payer: Self-pay

## 2024-08-25 ENCOUNTER — Other Ambulatory Visit (HOSPITAL_BASED_OUTPATIENT_CLINIC_OR_DEPARTMENT_OTHER): Payer: Self-pay

## 2024-08-25 MED ORDER — DOXYCYCLINE HYCLATE 50 MG PO CAPS
50.0000 mg | ORAL_CAPSULE | Freq: Every day | ORAL | 1 refills | Status: DC
  Filled 2024-08-25 – 2024-08-31 (×2): qty 30, 30d supply, fill #0
  Filled 2024-10-10: qty 30, 30d supply, fill #1

## 2024-09-01 ENCOUNTER — Other Ambulatory Visit (HOSPITAL_BASED_OUTPATIENT_CLINIC_OR_DEPARTMENT_OTHER): Payer: Self-pay

## 2024-09-01 ENCOUNTER — Encounter: Payer: Self-pay | Admitting: Family Medicine

## 2024-09-01 ENCOUNTER — Ambulatory Visit: Admitting: Family Medicine

## 2024-09-01 ENCOUNTER — Other Ambulatory Visit: Payer: Self-pay

## 2024-09-01 ENCOUNTER — Telehealth: Payer: Self-pay

## 2024-09-01 VITALS — BP 100/60 | HR 52 | Temp 98.4°F | Resp 18 | Ht 74.0 in | Wt 211.6 lb

## 2024-09-01 DIAGNOSIS — R61 Generalized hyperhidrosis: Secondary | ICD-10-CM

## 2024-09-01 DIAGNOSIS — R509 Fever, unspecified: Secondary | ICD-10-CM

## 2024-09-01 DIAGNOSIS — L739 Follicular disorder, unspecified: Secondary | ICD-10-CM

## 2024-09-01 LAB — CBC WITH DIFFERENTIAL/PLATELET
Basophils Absolute: 0 K/uL (ref 0.0–0.1)
Basophils Relative: 0.6 % (ref 0.0–3.0)
Eosinophils Absolute: 0.1 K/uL (ref 0.0–0.7)
Eosinophils Relative: 1.8 % (ref 0.0–5.0)
HCT: 40.2 % (ref 39.0–52.0)
Hemoglobin: 13.7 g/dL (ref 13.0–17.0)
Lymphocytes Relative: 16 % (ref 12.0–46.0)
Lymphs Abs: 0.9 K/uL (ref 0.7–4.0)
MCHC: 34 g/dL (ref 30.0–36.0)
MCV: 97.7 fl (ref 78.0–100.0)
Monocytes Absolute: 0.6 K/uL (ref 0.1–1.0)
Monocytes Relative: 11 % (ref 3.0–12.0)
Neutro Abs: 4.1 K/uL (ref 1.4–7.7)
Neutrophils Relative %: 70.6 % (ref 43.0–77.0)
Platelets: 191 K/uL (ref 150.0–400.0)
RBC: 4.11 Mil/uL — ABNORMAL LOW (ref 4.22–5.81)
RDW: 14.1 % (ref 11.5–15.5)
WBC: 5.9 K/uL (ref 4.0–10.5)

## 2024-09-01 LAB — COMPREHENSIVE METABOLIC PANEL WITH GFR
ALT: 27 U/L (ref 0–53)
AST: 40 U/L — ABNORMAL HIGH (ref 0–37)
Albumin: 4.2 g/dL (ref 3.5–5.2)
Alkaline Phosphatase: 92 U/L (ref 39–117)
BUN: 12 mg/dL (ref 6–23)
CO2: 29 meq/L (ref 19–32)
Calcium: 9 mg/dL (ref 8.4–10.5)
Chloride: 102 meq/L (ref 96–112)
Creatinine, Ser: 0.89 mg/dL (ref 0.40–1.50)
GFR: 97.4 mL/min (ref >60.00)
Glucose, Bld: 88 mg/dL (ref 70–99)
Potassium: 4.1 meq/L (ref 3.5–5.1)
Sodium: 139 meq/L (ref 135–145)
Total Bilirubin: 0.5 mg/dL (ref 0.2–1.2)
Total Protein: 6.2 g/dL (ref 6.0–8.3)

## 2024-09-01 LAB — POC INFLUENZA A&B (BINAX/QUICKVUE)
Influenza A, POC: NEGATIVE
Influenza B, POC: NEGATIVE

## 2024-09-01 LAB — TESTOSTERONE: Testosterone: 1145.81 ng/dL — ABNORMAL HIGH (ref 300.00–890.00)

## 2024-09-01 LAB — POC COVID19 BINAXNOW: SARS Coronavirus 2 Ag: NEGATIVE

## 2024-09-01 MED ORDER — DOXYCYCLINE HYCLATE 100 MG PO TABS
100.0000 mg | ORAL_TABLET | Freq: Two times a day (BID) | ORAL | 0 refills | Status: DC
  Filled 2024-09-01: qty 20, 10d supply, fill #0

## 2024-09-01 NOTE — Telephone Encounter (Signed)
 Appt scheduled

## 2024-09-01 NOTE — Telephone Encounter (Signed)
 Initial Comment Caller states he did a really long race he had a fever of 102 yesterday and has a rash around his groin and will like to schedule a appointment. Patient request to speak to RN No Additional Comment Office hours provided, triage declined. Translation No Disp. Time Titus Time) Disposition Final User 09/01/2024 7:14:24 AM General Information Provided Yes Voncille Chick Final Disposition 09/01/2024 7:14:24 AM General Information Provided Yes Voncille Scrape

## 2024-09-01 NOTE — Progress Notes (Signed)
 Subjective:    Patient ID: Gilbert [name], male    DOB: 09/04/1971, 53 y.o.   MRN: [id number]  Chief Complaint  Patient presents with   Groin Pain    Pt states running a race over the weekend. Pt states finding a knot on the left groin. Pt states also having some irritation. Pt states having a fever yesterday of 102    HPI Patient is in today for knot in groin and fever.  Discussed the use of AI scribe software for clinical note transcription with the patient, who gave verbal consent to proceed.  History of Present Illness Gilbert [name] is a 53 year old male who presents with leg pain and rash following a 30K trail run.  He experienced significant leg pain and fatigue after participating in a 30K trail run on Saturday, which was more than he had previously run. The pain is severe, particularly in the knees, with the right knee being more affected. He has been using a massage gun to alleviate the pain and has been taking ibuprofen and Tylenol  regularly to manage the soreness.  He developed a rash on Friday, prior to the race, which has worsened since. The rash is painful but not itchy, and he suspects it may be related to shaving. There is no discharge from the rash, and it is more pronounced on one side. He has been taking a low dose of doxycycline  (50 mg daily) prescribed by a dermatologist for breakouts on his head, which he attributes to testosterone  use.  He has been experiencing night sweats for approximately a year, which have not improved despite adjustments to his testosterone  therapy. He has been receiving testosterone  treatment from [facility], including shots and cream, but has not had recent blood work to assess levels. The night sweats vary in intensity but occur nightly.  He experienced a fever on Sunday. He has been taking ibuprofen and Tylenol  to manage the fever and body aches. He was congested and sneezing more than usual on Sunday but did not take any medication  for these symptoms.  He mentions a knot in the groin area, which he discovered while using a massage gun. He is concerned it may be related to a previous heart ablation performed in the spring, although he is unsure if the procedure was done on that side. He lives alone and has not been around anyone sick recently.    Past Medical History:  Diagnosis Date   ACHILLES TENDINITIS 12/28/2008   Qualifier: Diagnosis of  By: [name], [name]     Actinic keratosis 03/14/2022   Acute appendicitis 07/31/2018   Acute frontal sinusitis 09/01/2008   Qualifier: Diagnosis of  By: Antonio DOJamee     Alcohol use 09/20/2018   Allergic rhinitis 09/16/2014   Anal inflammation 03/08/2018   Asthma 09/13/2013   Backache 01/17/2007   Qualifier: Diagnosis of   By: [name], [name]      IMO SNOMED Dx Update Oct 2024     Basal cell carcinoma (BCC) of left shoulder 03/14/2022   Basal cell carcinoma of face 03/14/2022   Basal cell carcinoma of right popliteal region 03/14/2022   CHEST PAIN 06/29/2009   Qualifier: Diagnosis of  By: [name], [facility] WALL PAIN, ANTERIOR 09/22/2010   Qualifier: Diagnosis of  By: [name], [name]     Chronic pain of right ankle 09/20/2018   Decreased libido 12/14/2022   occasional ED.  Dyspnea on exertion 03/16/2022   Dysthymia 11/21/2018   Ectopic atrial rhythm 03/16/2022   Elevated blood sugar 09/20/2018   Elevated liver enzymes 07/19/2020   Essential hypertension 01/17/2007   Qualifier: Diagnosis of  By: [name], [name]     External hemorrhoids 03/31/2008   Qualifier: Diagnosis of  By: [name], [name]    GASTROENTERITIS 06/29/2009   Qualifier: Diagnosis of  By: [name], [name] 11/21/2011   GERD 06/29/2009   Qualifier: Diagnosis of  By: [name], [facility]     Healthcare maintenance 09/10/2017   Hemangioma of skin and subcutaneous tissue 03/14/2022   Hepatitis    unknown type many yrs ago - no  residual problems   History of malignant neoplasm of skin 03/14/2022   Insomnia    Lapband APL + [facility] repair July 2013 04/24/2012   Left arm numbness 02/05/2014   Leg wound, left 09/16/2014   Lentigo 03/14/2022   Low testosterone  09/10/2017   Lump in neck 04/22/2014   Medically noncompliant 07/16/2020   Melanocytic nevi of left upper limb, including shoulder 03/14/2022   Melanocytic nevi of trunk 03/14/2022   Mild sleep apnea 03/04/2024   02/08/24 Home sleep study ([facility]): AHI 5   Milia 03/14/2022   Morbid obesity (HCC)    Neoplasm of uncertain behavior of skin 03/14/2022   Night sweat 11/21/2018   OBESITY 03/31/2008   Qualifier: Diagnosis of  By: [name], [name]    Obesity (BMI 30.0-34.9) 03/16/2022   Ocular rosacea    Other seborrheic keratosis 03/14/2022   Other skin changes due to chronic exposure to nonionizing radiation 03/14/2022   Palpitations 03/16/2022   Post-nasal drip 07/16/2020   Pruritus ani 11/21/2018   Pseudofolliculitis barbae 03/14/2022   Vasomotor rhinitis 11/21/2018   Viral warts 03/14/2022   Vitamin D  deficiency 09/23/2018    Past Surgical History:  Procedure Laterality Date   ANKLE ARTHROSCOPY WITH RECONSTRUCTION Right 09/26/2018   Procedure: ANKLE DEBRIDEMENT WITH POSSIBLE LATERAL LIGAMENT  RECONSTRUCTION;  Surgeon: [name], MD;  Location: [facility] OR;  Service: Orthopedics;  Laterality: Right;   ATRIAL FIBRILLATION ABLATION N/A 01/04/2024   Procedure: ATRIAL FIBRILLATION ABLATION;  Surgeon: [name], MD;  Location: [facility] INVASIVE CV LAB;  Service: Cardiovascular;  Laterality: N/A;   HIATAL HERNIA REPAIR  04/23/2012   Procedure: HERNIA REPAIR HIATAL;  Surgeon: [name], MD;  Location: [facility] ORS;  Service: General;  Laterality: N/A;   KNEE SURGERY Right    fracture knee cap   LAPAROSCOPIC APPENDECTOMY N/A 07/31/2018   Procedure: APPENDECTOMY LAPAROSCOPIC;  Surgeon: [name], MD;  Location: [facility] ORS;  Service: General;   Laterality: N/A;   LAPAROSCOPIC GASTRIC BANDING  04/23/2012   Procedure: LAPAROSCOPIC GASTRIC BANDING;  Surgeon: [name], MD;  Location: [facility] ORS;  Service: General;  Laterality: N/A;   LASIK      Family History  Problem Relation Age of Onset   Hypertension Father    Diabetes Father    Heart disease Father        pacemaker   Colon cancer Neg Hx    Colon polyps Neg Hx    Esophageal cancer Neg Hx    Rectal cancer Neg Hx    Stomach cancer Neg Hx     Social History   Socioeconomic History   Marital status: Single    Spouse name: Not on file   Number of children: Not on file  Years of education: Not on file   Highest education level: Not on file  Occupational History   Not on file  Tobacco Use   Smoking status: Never   Smokeless tobacco: Never   Tobacco comments:    Never smoked 01/924  Vaping Use   Vaping status: Never Used  Substance and Sexual Activity   Alcohol use: Yes    Alcohol/week: 5.0 standard drinks of alcohol    Types: 5 Shots of liquor per week    Comment: social   Drug use: Yes    Comment: thc gummy   Sexual activity: Not on file  Other Topics Concern   Not on file  Social History Narrative   Not on file   Social Drivers of Health   Financial Resource Strain: Not on file  Food Insecurity: Low Risk (11/15/2023)   Received from [facility]   Hunger Vital Sign    Within the past 12 months, you worried that your food would run out before you got money to buy more: Never true    Within the past 12 months, the food you bought just didn't last and you didn't have money to get more. : Never true  Transportation Needs: No Transportation Needs (11/15/2023)   Received from [facility]    In the past 12 months, has lack of reliable transportation kept you from medical appointments, meetings, work or from getting things needed for daily living? : No  Physical Activity: Not on file  Stress: Not on file  Social Connections: Unknown  (02/06/2022)   Received from [facility]   Social Network    Social Network: Not on file  Intimate Partner Violence: Not At Risk (08/22/2023)   Received from [facility]   HITS    Over the last 12 months how often did your partner physically hurt you?: Never    Over the last 12 months how often did your partner insult you or talk down to you?: Never    Over the last 12 months how often did your partner threaten you with physical harm?: Never    Over the last 12 months how often did your partner scream or curse at you?: Never    Outpatient Medications Prior to Visit  Medication Sig Dispense Refill   buPROPion  (WELLBUTRIN  XL) 150 MG 24 hr tablet Take 1 tablet (150 mg total) by mouth every morning. 90 tablet 2   doxycycline  (VIBRAMYCIN ) 50 MG capsule Take 1 capsule (50 mg total) by mouth daily with food. 30 capsule 1   eszopiclone  (LUNESTA ) 2 MG TABS tablet Take 1 tablet (2 mg total) by mouth at bedtime. 30 tablet 5   hydrocortisone  (ANUSOL -HC) 25 MG suppository Unwrap and place 1 suppository (25 mg total) rectally 2 (two) times daily as needed for hemorrhoids or anal itching. 12 suppository 1   levocetirizine (XYZAL) 5 MG tablet Take 5 mg by mouth every evening.     ondansetron  (ZOFRAN -ODT) 4 MG disintegrating tablet Take 1 tablet (4 mg total) by mouth every 8 (eight) hours as directed. 15 tablet 0   sildenafil  (VIAGRA ) 100 MG tablet Take 0.5-1 tablets (50-100 mg total) by mouth daily as needed for erectile dysfunction. 14 tablet 5   triamcinolone  cream (KENALOG ) 0.5 % Apply 1 Application topically 2 (two) times daily as needed. 15 g 1   UNABLE TO FIND Take 1 capsule by mouth at bedtime. Med Name: Cortisol Manager     UNABLE TO FIND Take 1 capsule by  mouth at bedtime. Med Name: Tri-Magnesium     UNABLE TO FIND Take 1 capsule by mouth every morning. Med Name: Methyl B12     UNABLE TO FIND Take 1 capsule by mouth every morning. Med Name: D3 10 and Vitamin K     doxycycline  (VIBRAMYCIN ) 50  MG capsule Take 1 capsule (50 mg total) by mouth daily with food. 30 capsule 1   testosterone  cypionate (DEPOTESTOSTERONE CYPIONATE) 200 MG/ML injection Inject 0.5 mLs (100 mg total) into the muscle once a week. 10 mL 0   testosterone  cypionate (DEPOTESTOSTERONE CYPIONATE) 200 MG/ML injection Inject 0.25 mLs (50 mg total) into the muscle once a week. 10 mL 0   No facility-administered medications prior to visit.    Allergies  Allergen Reactions   Penicillins Rash    Has patient had a PCN reaction causing immediate rash, facial/tongue/throat swelling, SOB or lightheadedness with hypotension: Unknown Has patient had a PCN reaction causing severe rash involving mucus membranes or skin necrosis: Unknown Has patient had a PCN reaction that required hospitalization: Unknown Has patient had a PCN reaction occurring within the last 10 years: No If all of the above answers are NO, then may proceed with Cephalosporin use.     Review of Systems  Constitutional:  Positive for chills and fever. Negative for malaise/fatigue.  HENT:  Negative for congestion.   Eyes:  Negative for blurred vision.  Respiratory:  Negative for shortness of breath.   Cardiovascular:  Negative for chest pain, palpitations and leg swelling.  Gastrointestinal:  Negative for abdominal pain, blood in stool and nausea.  Genitourinary:  Negative for dysuria and frequency.  Musculoskeletal:  Positive for myalgias. Negative for falls.  Skin:  Positive for rash.  Neurological:  Negative for dizziness, loss of consciousness and headaches.  Endo/Heme/Allergies:  Negative for environmental allergies.  Psychiatric/Behavioral:  Negative for depression. The patient is not nervous/anxious.        Objective:    Physical Exam Vitals and nursing note reviewed.  Constitutional:      General: He is not in acute distress.    Appearance: Normal appearance. He is well-developed.  HENT:     Head: Normocephalic and atraumatic.  Eyes:      General: No scleral icterus.       Right eye: No discharge.        Left eye: No discharge.  Cardiovascular:     Rate and Rhythm: Normal rate and regular rhythm.     Heart sounds: No murmur heard. Pulmonary:     Effort: Pulmonary effort is normal. No respiratory distress.     Breath sounds: Normal breath sounds.  Musculoskeletal:        General: Normal range of motion.     Cervical back: Normal range of motion and neck supple.     Right lower leg: No edema.     Left lower leg: No edema.  Skin:    General: Skin is warm and dry.     Findings: Erythema and rash present. Rash is papular.      Neurological:     Mental Status: He is alert and oriented to person, place, and time.  Psychiatric:        Mood and Affect: Mood normal.        Behavior: Behavior normal.        Thought Content: Thought content normal.        Judgment: Judgment normal.     BP 100/60 (BP Location: Left Arm, Patient  Position: Sitting, Cuff Size: Normal)   Pulse (!) 52   Temp 98.4 F (36.9 C) (Oral)   Resp 18   Ht 6' 2 (1.88 m)   Wt 211 lb 9.6 oz (96 kg)   SpO2 98%   BMI 27.17 kg/m  Wt Readings from Last 3 Encounters:  09/01/24 211 lb 9.6 oz (96 kg)  06/18/24 195 lb (88.5 kg)  05/19/24 211 lb (95.7 kg)    Diabetic Foot Exam - Simple   No data filed    Lab Results  Component Value Date   WBC 5.9 09/01/2024   HGB 13.7 09/01/2024   HCT 40.2 09/01/2024   PLT 191.0 09/01/2024   GLUCOSE 88 09/01/2024   CHOL 182 11/14/2023   TRIG 66 11/14/2023   HDL 52 11/14/2023   LDLCALC 118 11/14/2023   ALT 27 09/01/2024   AST 40 (H) 09/01/2024   NA 139 09/01/2024   K 4.1 09/01/2024   CL 102 09/01/2024   CREATININE 0.89 09/01/2024   BUN 12 09/01/2024   CO2 29 09/01/2024   TSH 1.35 11/14/2023   TSH 1.35 11/14/2023   PSA 0.54 12/14/2022   HGBA1C 5.1 11/14/2023    Lab Results  Component Value Date   TSH 1.35 11/14/2023   TSH 1.35 11/14/2023   Lab Results  Component Value Date   WBC 5.9  09/01/2024   HGB 13.7 09/01/2024   HCT 40.2 09/01/2024   MCV 97.7 09/01/2024   PLT 191.0 09/01/2024   Lab Results  Component Value Date   NA 139 09/01/2024   K 4.1 09/01/2024   CO2 29 09/01/2024   GLUCOSE 88 09/01/2024   BUN 12 09/01/2024   CREATININE 0.89 09/01/2024   BILITOT 0.5 09/01/2024   ALKPHOS 92 09/01/2024   AST 40 (H) 09/01/2024   ALT 27 09/01/2024   PROT 6.2 09/01/2024   ALBUMIN 4.2 09/01/2024   CALCIUM 9.0 09/01/2024   ANIONGAP 10 06/18/2024   EGFR 95 12/10/2023   GFR 97.40 09/01/2024   Lab Results  Component Value Date   CHOL 182 11/14/2023   Lab Results  Component Value Date   HDL 52 11/14/2023   Lab Results  Component Value Date   LDLCALC 118 11/14/2023   Lab Results  Component Value Date   TRIG 66 11/14/2023   Lab Results  Component Value Date   CHOLHDL 2 12/14/2022   Lab Results  Component Value Date   HGBA1C 5.1 11/14/2023       Assessment & Plan:  Chronic night sweats -     Testosterone  -     CBC with Differential/Platelet -     Comprehensive metabolic panel with GFR  Fever, unspecified fever cause -     Testosterone  -     CBC with Differential/Platelet -     Comprehensive metabolic panel with GFR -     POC COVID-19 BinaxNow -     POC Influenza A&B(BINAX/QUICKVUE)  Folliculitis -     Doxycycline  Hyclate; Take 1 tablet (100 mg total) by mouth 2 (two) times daily.  Dispense: 20 tablet; Refill: 0 -     Ambulatory referral to Endocrinology   Assessment and Plan Assessment & Plan Folliculitis of groin   Folliculitis in the groin area is likely due to shaving, causing irritation and pain. Differential diagnosis includes MRSA and shingles, though shingles is less likely as the rash crosses the midline. There is concern for potential infection given the presence of fever and rash. Prescribed doxycycline  100  mg twice daily for 10 days and advised to stop the current low-dose doxycycline  during this period. He should monitor for  improvement in 2-3 days; if no improvement, alternative treatment will be considered. Blood work was ordered to rule out flu and COVID, and urine was tested for normalcy.  Fever and myalgia   He experienced fever and myalgia following a 30K trail run, with muscle pain primarily in the legs. The fever resolved with antipyretics. Differential diagnosis includes a viral infection such as flu or COVID, given recent exposure to a large group and symptoms. Blood work was ordered to rule out flu and COVID. He was advised to monitor symptoms and report if fever or myalgia persists.  Chronic night sweats   Night sweats have persisted for approximately one year, possibly related to fluctuating testosterone  levels. Previous adjustments in testosterone  therapy have not resolved symptoms. A referral to an endocrinologist is recommended for further evaluation of night sweats and testosterone  management.                                                  +                                                                                                                                                                     Erykah Lippert R Lowne Chase, DO

## 2024-09-04 ENCOUNTER — Ambulatory Visit: Payer: Self-pay | Admitting: Family Medicine

## 2024-09-11 ENCOUNTER — Other Ambulatory Visit: Payer: Self-pay

## 2024-09-11 ENCOUNTER — Other Ambulatory Visit: Payer: Self-pay | Admitting: Family Medicine

## 2024-09-11 DIAGNOSIS — N529 Male erectile dysfunction, unspecified: Secondary | ICD-10-CM

## 2024-09-11 MED ORDER — SILDENAFIL CITRATE 100 MG PO TABS
50.0000 mg | ORAL_TABLET | Freq: Every day | ORAL | 5 refills | Status: AC | PRN
  Filled 2024-09-11: qty 14, 14d supply, fill #0
  Filled 2024-10-10: qty 14, 14d supply, fill #1

## 2024-09-12 ENCOUNTER — Other Ambulatory Visit (HOSPITAL_BASED_OUTPATIENT_CLINIC_OR_DEPARTMENT_OTHER): Payer: Self-pay

## 2024-09-12 MED ORDER — ESZOPICLONE 2 MG PO TABS
2.0000 mg | ORAL_TABLET | Freq: Every day | ORAL | 3 refills | Status: AC
  Filled 2024-09-12 – 2024-09-22 (×2): qty 30, 30d supply, fill #0
  Filled 2024-10-10 – 2024-10-18 (×2): qty 30, 30d supply, fill #1
  Filled ????-??-??: fill #0

## 2024-09-22 ENCOUNTER — Other Ambulatory Visit (HOSPITAL_BASED_OUTPATIENT_CLINIC_OR_DEPARTMENT_OTHER): Payer: Self-pay

## 2024-09-26 ENCOUNTER — Ambulatory Visit: Payer: Self-pay

## 2024-09-26 ENCOUNTER — Ambulatory Visit: Admitting: Family Medicine

## 2024-09-26 ENCOUNTER — Encounter: Payer: Self-pay | Admitting: Family Medicine

## 2024-09-26 ENCOUNTER — Other Ambulatory Visit (HOSPITAL_BASED_OUTPATIENT_CLINIC_OR_DEPARTMENT_OTHER): Payer: Self-pay

## 2024-09-26 ENCOUNTER — Telehealth: Payer: Self-pay | Admitting: Family Medicine

## 2024-09-26 VITALS — BP 135/71 | HR 56 | Ht 74.0 in | Wt 209.0 lb

## 2024-09-26 DIAGNOSIS — R61 Generalized hyperhidrosis: Secondary | ICD-10-CM

## 2024-09-26 DIAGNOSIS — M25572 Pain in left ankle and joints of left foot: Secondary | ICD-10-CM

## 2024-09-26 MED ORDER — PREDNISONE 20 MG PO TABS
40.0000 mg | ORAL_TABLET | Freq: Every day | ORAL | 0 refills | Status: AC
  Filled 2024-09-26: qty 10, 5d supply, fill #0

## 2024-09-26 NOTE — Progress Notes (Signed)
 "  Acute Office Visit  Subjective:  Patient ID: Gilbert [name], male    DOB: Oct 24, 1970  Age: 54 y.o. MRN: [id number]  CC:  Chief Complaint  Patient presents with   Night Sweats   Ankle Pain      HPI Gilbert [name] is here for ankle pain and night sweats.    Discussed the use of AI scribe software for clinical note transcription with the patient, who gave verbal consent to proceed.  History of Present Illness Gilbert [name] is a 54 year old male who presents with persistent ankle pain and swelling following a running injury.  He has persistent pain and swelling in his left lateral ankle following a running injury about a month ago. The pain began in the Achilles area and radiated upwards, becoming more generalized. It worsened after participating in a 30K trail race, his first trail race, where he experienced significant discomfort during the last lap. Post-race, he developed a fever and ingrown hairs, treated with doxycycline , which resolved the skin issues.  He sought care at an urgent care in [place] where x-rays were negative, and he was told it was likely a bad sprain. He wrapped the ankle, applied ice, and elevated it. An IM steroid injection provided some relief but did not resolve the pain completely. The pain persists, especially with rotational movements, and is tender to touch behind the ankle bone. He notes some swelling and occasional popping sounds when moving the ankle.  He has a history of ankle surgery on the other foot and has previously seen a podiatrist for foot issues, including receiving a shot for pain relief. He has an upcoming appointment with a podiatrist to further evaluate his current condition. He is currently not using any specific immobilization device as he discarded his previous one after moving. He has been using a neoprene ankle brace for compression. He is frustrated with the inability to exercise, as running has been therapeutic for him.  He  reports high testosterone  levels and associated night sweats, which have been persistent. He is currently using a topical testosterone  treatment and has been trying to be in contact with his integrated health provider regarding these issues. He is awaiting an appointment with an endocrinologist to address these hormonal concerns.        Past Medical History:  Diagnosis Date   ACHILLES TENDINITIS 12/28/2008   Qualifier: Diagnosis of  By: [name], [name]     Actinic keratosis 03/14/2022   Acute appendicitis 07/31/2018   Acute frontal sinusitis 09/01/2008   Qualifier: Diagnosis of  By: Antonio DOJamee     Alcohol use 09/20/2018   Allergic rhinitis 09/16/2014   Anal inflammation 03/08/2018   Asthma 09/13/2013   Backache 01/17/2007   Qualifier: Diagnosis of   By: [name], [name]      IMO SNOMED Dx Update Oct 2024     Basal cell carcinoma (BCC) of left shoulder 03/14/2022   Basal cell carcinoma of face 03/14/2022   Basal cell carcinoma of right popliteal region 03/14/2022   CHEST PAIN 06/29/2009   Qualifier: Diagnosis of  By: [name], [facility] WALL PAIN, ANTERIOR 09/22/2010   Qualifier: Diagnosis of  By: [name], [name]     Chronic pain of right ankle 09/20/2018   Decreased libido 12/14/2022   occasional ED.     Dyspnea on exertion 03/16/2022   Dysthymia 11/21/2018   Ectopic atrial rhythm 03/16/2022   Elevated blood sugar 09/20/2018  Elevated liver enzymes 07/19/2020   Essential hypertension 01/17/2007   Qualifier: Diagnosis of  By: [name], [name]     External hemorrhoids 03/31/2008   Qualifier: Diagnosis of  By: [name], [name]    GASTROENTERITIS 06/29/2009   Qualifier: Diagnosis of  By: [name], [name] 11/21/2011   GERD 06/29/2009   Qualifier: Diagnosis of  By: [name], [place]     Healthcare maintenance 09/10/2017   Hemangioma of skin and subcutaneous tissue 03/14/2022   Hepatitis    unknown type  many yrs ago - no residual problems   History of malignant neoplasm of skin 03/14/2022   Insomnia    Lapband [facility] repair July 2013 04/24/2012   Left arm numbness 02/05/2014   Leg wound, left 09/16/2014   Lentigo 03/14/2022   Low testosterone  09/10/2017   Lump in neck 04/22/2014   Medically noncompliant 07/16/2020   Melanocytic nevi of left upper limb, including shoulder 03/14/2022   Melanocytic nevi of trunk 03/14/2022   Mild sleep apnea 03/04/2024   02/08/24 Home sleep study ([facility]): AHI 5   Milia 03/14/2022   Morbid obesity (HCC)    Neoplasm of uncertain behavior of skin 03/14/2022   Night sweat 11/21/2018   OBESITY 03/31/2008   Qualifier: Diagnosis of  By: [name], [name]    Obesity (BMI 30.0-34.9) 03/16/2022   Ocular rosacea    Other seborrheic keratosis 03/14/2022   Other skin changes due to chronic exposure to nonionizing radiation 03/14/2022   Palpitations 03/16/2022   Post-nasal drip 07/16/2020   Pruritus ani 11/21/2018   Pseudofolliculitis barbae 03/14/2022   Vasomotor rhinitis 11/21/2018   Viral warts 03/14/2022   Vitamin D  deficiency 09/23/2018    Past Surgical History:  Procedure Laterality Date   ANKLE ARTHROSCOPY WITH RECONSTRUCTION Right 09/26/2018   Procedure: ANKLE DEBRIDEMENT WITH POSSIBLE LATERAL LIGAMENT  RECONSTRUCTION;  Surgeon: [name], MD;  [facility];  Service: Orthopedics;  Laterality: Right;   ATRIAL FIBRILLATION ABLATION N/A 01/04/2024   Procedure: ATRIAL FIBRILLATION ABLATION;  Surgeon: [name], MD;  Location: [facility] INVASIVE CV LAB;  Service: Cardiovascular;  Laterality: N/A;   HIATAL HERNIA REPAIR  04/23/2012   Procedure: HERNIA REPAIR HIATAL;  Surgeon: [name], MD;  Location: [facility] ORS;  Service: General;  Laterality: N/A;   KNEE SURGERY Right    fracture knee cap   LAPAROSCOPIC APPENDECTOMY N/A 07/31/2018   Procedure: APPENDECTOMY LAPAROSCOPIC;  Surgeon: [name], MD;  Location: [facility] ORS;   Service: General;  Laterality: N/A;   LAPAROSCOPIC GASTRIC BANDING  04/23/2012   Procedure: LAPAROSCOPIC GASTRIC BANDING;  Surgeon: [name], MD;  Location: [facility] ORS;  Service: General;  Laterality: N/A;   LASIK      Family History  Problem Relation Age of Onset   Hypertension Father    Diabetes Father    Heart disease Father        pacemaker   Colon cancer Neg Hx    Colon polyps Neg Hx    Esophageal cancer Neg Hx    Rectal cancer Neg Hx    Stomach cancer Neg Hx     Social History   Socioeconomic History   Marital status: Single    Spouse name: Not on file   Number of children: Not on file   Years of education: Not on file   Highest education level: Not on file  Occupational History   Not  on file  Tobacco Use   Smoking status: Never   Smokeless tobacco: Never   Tobacco comments:    Never smoked 01/924  Vaping Use   Vaping status: Never Used  Substance and Sexual Activity   Alcohol use: Yes    Alcohol/week: 5.0 standard drinks of alcohol    Types: 5 Shots of liquor per week    Comment: social   Drug use: Yes    Comment: thc gummy   Sexual activity: Not on file  Other Topics Concern   Not on file  Social History Narrative   Not on file   Social Drivers of Health   Tobacco Use: Low Risk (09/26/2024)   Patient History    Smoking Tobacco Use: Never    Smokeless Tobacco Use: Never    Passive Exposure: Not on file  Financial Resource Strain: Not on file  Food Insecurity: Low Risk (11/15/2023)   Received from [facility]   Epic    Within the past 12 months, you worried that your food would run out before you got money to buy more: Never true    Within the past 12 months, the food you bought just didn't last and you didn't have money to get more. : Never true  Transportation Needs: No Transportation Needs (11/15/2023)   Received from [facility]    In the past 12 months, has lack of reliable transportation kept you from medical  appointments, meetings, work or from getting things needed for daily living? : No  Physical Activity: Not on file  Stress: Not on file  Social Connections: Unknown (02/06/2022)   Received from [facility]   Social Network    Social Network: Not on file  Intimate Partner Violence: Not At Risk (08/22/2023)   Received from [facility]   HITS    Over the last 12 months how often did your partner physically hurt you?: Never    Over the last 12 months how often did your partner insult you or talk down to you?: Never    Over the last 12 months how often did your partner threaten you with physical harm?: Never    Over the last 12 months how often did your partner scream or curse at you?: Never  Depression (PHQ2-9): Medium Risk (09/26/2024)   Depression (PHQ2-9)    PHQ-2 Score: 8  Alcohol Screen: Not on file  Housing: Low Risk (11/15/2023)   Received from [facility]   Epic    What is your living situation today?: I have a steady place to live    Think about the place you live. Do you have problems with any of the following? Choose all that apply:: None/None on this list  Utilities: Low Risk (11/15/2023)   Received from [facility]   Utilities    In the past 12 months has the electric, gas, oil, or water company threatened to shut off services in your home? : No  Health Literacy: Not on file    ROS All ROS negative except what is listed in the HPI.   Objective:   Today's Vitals: BP 135/71   Pulse (!) 56   Ht 6' 2 (1.88 m)   Wt 209 lb (94.8 kg)   SpO2 100%   BMI 26.83 kg/m   Physical Exam Vitals reviewed.  Constitutional:      Appearance: Normal appearance.  Musculoskeletal:       Feet:  Feet:     Comments: Mild swelling and  tenderness to palpation, no skin changes Skin:    General: Skin is warm and dry.     Capillary Refill: Capillary refill takes less than 2 seconds.  Neurological:     Mental Status: He is alert and oriented to person, place, and time.   Psychiatric:        Mood and Affect: Mood normal.        Behavior: Behavior normal.        Thought Content: Thought content normal.        Judgment: Judgment normal.           Assessment & Plan:   Problem List Items Addressed This Visit   None Visit Diagnoses       Acute left ankle pain    -  Primary   Relevant Medications   predniSONE  (DELTASONE ) 20 MG tablet     Chronic night sweats           Assessment & Plan Ankle pain Negative x-rays.  - Prescribed prednisone  for 5 days to reduce pain/swelling. - Recommended icing and heating the ankle. - Suggested obtaining an ankle brace or boot for immobilization. - Encouraged gentle range of motion exercises with a resistance band. - See podiatry for further evaluation and possible injection - already has appt set  - Advised against strenuous activity until podiatry evaluation.  Testosterone  excess with night sweats Elevated testosterone  levels with associated night sweats. Current topical testosterone  therapy may be excessive. Previous labs showed testosterone  levels over 1100, above target range. - Advised contacting prescribing provider for evaluation and potential adjustment of testosterone  therapy. - He already has a pending endocrinology referral - phone number provided so he can reach out to them.       Follow-up: Return if symptoms worsen or fail to improve.   Waddell FURY Almarie, DNP, FNP-C  I,Emily Lagle,acting as a neurosurgeon for Waddell KATHEE Almarie, NP.,have documented all relevant documentation on the behalf of Waddell KATHEE Almarie, NP.   I, Waddell KATHEE Almarie, NP, have reviewed all documentation for this visit. The documentation on 09/26/2024 for the exam, diagnosis, procedures, and orders are all accurate and complete. "

## 2024-09-26 NOTE — Telephone Encounter (Signed)
 FYI Only or Action Required?: FYI only for provider: appointment scheduled on 09/26/24.  Patient was last seen in primary care on 09/01/2024 by Antonio Meth, Jamee SAUNDERS, DO.  Called Nurse Triage reporting Pain.  Symptoms began about a month ago.  Interventions attempted: Rest, hydration, or home remedies.  Symptoms are: gradually worsening.  Triage Disposition: See PCP When Office is Open (Within 3 Days)  Patient/caregiver understands and will follow disposition?:  Reason for Disposition  [1] MODERATE pain (e.g., interferes with normal activities, limping) AND [2] present > 3 days  Answer Assessment - Initial Assessment Questions Patient [facility] d/t left ankle pain since he ran a race the first week of Dec. Pt continues to experience pain.   1. ONSET: When did the pain start?      Beginning of december 2. LOCATION: Where is the pain located?      Left ankle 3. PAIN: How bad is the pain?  (Scale 1-10; or mild, moderate, severe)     5/10 with ROM 4. WORK OR EXERCISE: Has there been any recent work or exercise that involved this part of the body?      Began after race 5. CAUSE: What do you think is causing the ankle pain?     Unknown 6. OTHER SYMPTOMS: Do you have any other symptoms? (e.g., calf pain, rash, fever, swelling)     Bruising  Protocols used: Ankle Pain-A-AH   Copied from CRM [id number]. Topic: Clinical - Red Word Triage >> Sep 26, 2024  8:42 AM Roselie BROCKS wrote: Red Word that prompted transfer to Nurse Triage: Patient states he is having extreme pain in left ankle,and night sweats.

## 2024-09-26 NOTE — Patient Instructions (Signed)
 [facility] End[facility] @ [place] [address], [address] [place], [place] [address]  [phone]

## 2024-09-26 NOTE — Telephone Encounter (Signed)
 Pt last cpe is 12/26/23 and would like to have his cpe before July since TB will be back from maternity leave around that time. He would like to schedule his cpe (around 12/26/24) with a another provider for the time being (he would prefer [facility]).

## 2024-09-26 NOTE — Telephone Encounter (Signed)
Okay to schedule with another provider.

## 2024-09-29 ENCOUNTER — Other Ambulatory Visit (HOSPITAL_BASED_OUTPATIENT_CLINIC_OR_DEPARTMENT_OTHER): Payer: Self-pay

## 2024-10-01 ENCOUNTER — Telehealth: Payer: Self-pay | Admitting: Family Medicine

## 2024-10-01 NOTE — Telephone Encounter (Signed)
 Copied from CRM #[id number]. Topic: General - Other >> Oct 01, 2024 12:51 PM Gilbert [name] wrote: Reason for CRM: patient calling to reschedule appt but I dint have anything until July. Can someone from the office please call him to get him in sooner with Dr Cruz?  Appt rescheduled & with Dr Cruz.ok per JM  Pt is requesting to have labs done before physical.  Please advise

## 2024-10-02 ENCOUNTER — Other Ambulatory Visit (HOSPITAL_BASED_OUTPATIENT_CLINIC_OR_DEPARTMENT_OTHER): Payer: Self-pay

## 2024-10-02 ENCOUNTER — Ambulatory Visit (INDEPENDENT_AMBULATORY_CARE_PROVIDER_SITE_OTHER)

## 2024-10-02 ENCOUNTER — Encounter: Payer: Self-pay | Admitting: Podiatry

## 2024-10-02 ENCOUNTER — Ambulatory Visit: Admitting: Podiatry

## 2024-10-02 DIAGNOSIS — M7751 Other enthesopathy of right foot: Secondary | ICD-10-CM

## 2024-10-02 DIAGNOSIS — M7752 Other enthesopathy of left foot: Secondary | ICD-10-CM

## 2024-10-02 DIAGNOSIS — S9002XA Contusion of left ankle, initial encounter: Secondary | ICD-10-CM | POA: Diagnosis not present

## 2024-10-02 DIAGNOSIS — M7662 Achilles tendinitis, left leg: Secondary | ICD-10-CM

## 2024-10-02 MED ORDER — NITROGLYCERIN 0.2 MG/HR TD PT24
0.2000 mg | MEDICATED_PATCH | Freq: Every day | TRANSDERMAL | 12 refills | Status: AC
  Filled 2024-10-02: qty 30, 30d supply, fill #0
  Filled 2024-10-29: qty 30, 30d supply, fill #1

## 2024-10-02 MED ORDER — TRIAMCINOLONE ACETONIDE 10 MG/ML IJ SUSP
10.0000 mg | Freq: Once | INTRAMUSCULAR | Status: AC
  Administered 2024-10-02: 10 mg via INTRA_ARTICULAR

## 2024-10-02 MED ORDER — DICLOFENAC SODIUM 75 MG PO TBEC
75.0000 mg | DELAYED_RELEASE_TABLET | Freq: Two times a day (BID) | ORAL | 2 refills | Status: AC
  Filled 2024-10-02: qty 50, 25d supply, fill #0
  Filled 2024-10-29: qty 50, 25d supply, fill #1

## 2024-10-03 NOTE — Progress Notes (Signed)
 Subjective:   Patient ID: Francis ONEIDA Public, male   DOB: 54 y.o.   MRN: [id number]   HPI Patient presents stating he has been getting much more active now and was trying to run trails and get ready for a half marathon and he developed a lot of pain in the back of his left Achilles tendon that has been very sore and at this point he has given up his running but he is not able to be active like he wants.  He has lost 120 pounds he does not smoke currently   Review of Systems  All other systems reviewed and are negative.       Objective:  Physical Exam Vitals and nursing note reviewed.  Constitutional:      Appearance: He is well-developed.  Pulmonary:     Effort: Pulmonary effort is normal.  Musculoskeletal:        General: Normal range of motion.  Skin:    General: Skin is warm.  Neurological:     Mental Status: He is alert.     Neurovascular status found to be intact muscle strength found to be adequate range of motion adequate except some splinting on the left side with exquisite discomfort in the Achilles tendon at the muscle tendon junction and he does have a history of partial tear.  It appears to be very sprained at this area and he does have a thin Achilles tendon noted which is part of his normal anatomy bilateral.  He is noted to have a lot of discomfort also in his fifth metatarsal heads bilateral which did very well for extended period of time.  Good digital perfusion well-oriented     Assessment:  Probability for significant strain of the Achilles tendon left with small possibility for tear but hopefully pretear and inflamed capsule fifth metatarsal bilateral     Plan:  H&P reviewed at great length and today I immobilized with a below the knee air fracture walker properly fitted.  I did discuss the importance of being very careful with this and that we may need to get MRI and it may require repair someday.  I also went ahead and prescribed nitro patches to use with  education and oral diclofenac  and I did sterile prep and I injected the capsule of the fifth MPJ bilateral 3 mg Kenalog  5 mg Xylocaine  with sterile dressings  X-rays indicate that there is no signs of calcification this appears to be strictly within the tendon itself and mild fluid around the fifth MPJ bilateral

## 2024-10-06 ENCOUNTER — Ambulatory Visit: Admitting: Podiatry

## 2024-10-10 ENCOUNTER — Other Ambulatory Visit: Payer: Self-pay | Admitting: Family Medicine

## 2024-10-10 ENCOUNTER — Other Ambulatory Visit (HOSPITAL_BASED_OUTPATIENT_CLINIC_OR_DEPARTMENT_OTHER): Payer: Self-pay

## 2024-10-10 ENCOUNTER — Other Ambulatory Visit: Payer: Self-pay

## 2024-10-10 DIAGNOSIS — K644 Residual hemorrhoidal skin tags: Secondary | ICD-10-CM

## 2024-10-10 MED ORDER — HYDROCORTISONE ACETATE 25 MG RE SUPP
25.0000 mg | Freq: Two times a day (BID) | RECTAL | 1 refills | Status: AC | PRN
  Filled 2024-10-10: qty 12, 6d supply, fill #0

## 2024-10-20 ENCOUNTER — Other Ambulatory Visit (HOSPITAL_BASED_OUTPATIENT_CLINIC_OR_DEPARTMENT_OTHER): Payer: Self-pay

## 2024-10-29 ENCOUNTER — Other Ambulatory Visit: Payer: Self-pay

## 2024-10-29 ENCOUNTER — Other Ambulatory Visit (HOSPITAL_BASED_OUTPATIENT_CLINIC_OR_DEPARTMENT_OTHER): Payer: Self-pay

## 2024-10-29 ENCOUNTER — Ambulatory Visit: Admitting: Podiatry

## 2024-10-29 ENCOUNTER — Encounter: Payer: Self-pay | Admitting: Podiatry

## 2024-10-29 DIAGNOSIS — M7752 Other enthesopathy of left foot: Secondary | ICD-10-CM

## 2024-10-29 DIAGNOSIS — M7662 Achilles tendinitis, left leg: Secondary | ICD-10-CM

## 2024-10-29 MED ORDER — DOXYCYCLINE HYCLATE 50 MG PO CAPS
50.0000 mg | ORAL_CAPSULE | Freq: Every day | ORAL | 1 refills | Status: AC
  Filled 2024-10-29: qty 30, 30d supply, fill #0
  Filled ????-??-??: fill #0

## 2024-10-29 MED ORDER — TRIAMCINOLONE ACETONIDE 10 MG/ML IJ SUSP
10.0000 mg | Freq: Once | INTRAMUSCULAR | Status: AC
  Administered 2024-10-29: 10 mg via INTRA_ARTICULAR

## 2024-10-29 NOTE — Progress Notes (Signed)
 Subjective:   Patient ID: Gilbert [name] Public, male   DOB: 54 y.o.   MRN: [id number]   HPI Patient states that the Achilles seems to be better but I am getting pain in my ankle now and I have concerns about both of these different problems.  States that he has been using the patches and taking the anti-inflammatories   ROS      Objective:  Physical Exam  Neurovascular status intact with diminished discomfort Achilles at the muscle tendon junction but still present no nodular formation and now has developed a lot of pain in the sinus tarsi with probable compensation     Assessment:  Inflammatory capsulitis sinus tarsi left along with posterior Achilles tendinitis left at the muscle tendon junction     Plan:  H&P discussed both conditions great length.  I went ahead and for the Achilles I recommended continued nitro patches slow reducing the oral anti-inflammatory and we read reviewed gradual reduction of the boot with gradual increase in activity levels.  Patient hopefully will be over the hump as far as that goes and I did go ahead today and due to the fact the sinus tarsi has become acute I did do sterile prep and I injected the sinus tarsi 3 mg dexamethasone  Kenalog  5 mg Xylocaine  applied sterile dressing reappoint as symptoms indicate

## 2024-10-30 ENCOUNTER — Ambulatory Visit: Admitting: Podiatry

## 2024-12-26 ENCOUNTER — Encounter: Admitting: Family Medicine

## 2025-01-01 ENCOUNTER — Encounter: Admitting: Family Medicine

## 2025-01-09 ENCOUNTER — Encounter: Admitting: Family Medicine
